# Patient Record
Sex: Female | Born: 1960 | Race: White | Hispanic: No | Marital: Single | Smoking: Never smoker
Health system: Southern US, Community
[De-identification: ages and names within clinical notes are randomized; demographics above are authoritative.]

## PROBLEM LIST (undated history)

## (undated) DIAGNOSIS — R7303 Prediabetes: Secondary | ICD-10-CM

## (undated) DIAGNOSIS — Z8739 Personal history of other diseases of the musculoskeletal system and connective tissue: Secondary | ICD-10-CM

## (undated) DIAGNOSIS — F419 Anxiety disorder, unspecified: Secondary | ICD-10-CM

## (undated) DIAGNOSIS — F329 Major depressive disorder, single episode, unspecified: Secondary | ICD-10-CM

## (undated) DIAGNOSIS — F32A Depression, unspecified: Secondary | ICD-10-CM

## (undated) DIAGNOSIS — Z923 Personal history of irradiation: Secondary | ICD-10-CM

## (undated) DIAGNOSIS — G8929 Other chronic pain: Secondary | ICD-10-CM

## (undated) DIAGNOSIS — G4730 Sleep apnea, unspecified: Secondary | ICD-10-CM

## (undated) DIAGNOSIS — F4310 Post-traumatic stress disorder, unspecified: Secondary | ICD-10-CM

## (undated) DIAGNOSIS — M5135 Other intervertebral disc degeneration, thoracolumbar region: Secondary | ICD-10-CM

## (undated) DIAGNOSIS — M1990 Unspecified osteoarthritis, unspecified site: Secondary | ICD-10-CM

## (undated) HISTORY — PX: NECK SURGERY: SHX720

## (undated) HISTORY — PX: ABDOMINAL HYSTERECTOMY: SHX81

---

## 2015-05-08 ENCOUNTER — Encounter (HOSPITAL_COMMUNITY): Payer: Self-pay

## 2015-05-08 ENCOUNTER — Emergency Department (HOSPITAL_COMMUNITY)
Admission: EM | Admit: 2015-05-08 | Discharge: 2015-05-09 | Disposition: A | Discharge status: Home / Self Care | Payer: Medicare Other | Attending: Emergency Medicine | Admitting: Emergency Medicine

## 2015-05-08 DIAGNOSIS — G8929 Other chronic pain: Secondary | ICD-10-CM | POA: Diagnosis not present

## 2015-05-08 DIAGNOSIS — M25562 Pain in left knee: Secondary | ICD-10-CM | POA: Diagnosis present

## 2015-05-08 DIAGNOSIS — Z8659 Personal history of other mental and behavioral disorders: Secondary | ICD-10-CM | POA: Insufficient documentation

## 2015-05-08 DIAGNOSIS — F329 Major depressive disorder, single episode, unspecified: Secondary | ICD-10-CM | POA: Diagnosis not present

## 2015-05-08 DIAGNOSIS — M25512 Pain in left shoulder: Secondary | ICD-10-CM | POA: Diagnosis not present

## 2015-05-08 HISTORY — DX: Post-traumatic stress disorder, unspecified: F43.10

## 2015-05-08 HISTORY — DX: Depression, unspecified: F32.A

## 2015-05-08 HISTORY — DX: Other chronic pain: G89.29

## 2015-05-08 HISTORY — DX: Anxiety disorder, unspecified: F41.9

## 2015-05-08 HISTORY — DX: Major depressive disorder, single episode, unspecified: F32.9

## 2015-05-08 HISTORY — DX: Personal history of other diseases of the musculoskeletal system and connective tissue: Z87.39

## 2015-05-08 NOTE — ED Notes (Addendum)
Pt presents with c/o generalized body aches. Pt reports she has degenerative disc disease in her neck and lower back so she has been aching all over her body. Pt also c/o left leg pain, hx of sciatic nerve pain.

## 2015-05-08 NOTE — ED Provider Notes (Signed)
CSN: [id number]     Arrival date & time 05/08/15  2229 History  This chart was scribed for non-physician practitioner, Charlann Lange, PA-C, working with April Palumbo, MD, by Stephania Fragmin, ED Scribe. This patient was seen in room WTR8/WTR8 and the patient's care was started at 11:55 PM.    Chief Complaint  Patient presents with  . Generalized Body Aches   The history is provided by the patient and a relative. No language interpreter was used.    HPI Comments: Kelli Estrada is a 54 y.o. female who presents to the Emergency Department complaining of multiple complaints of chronic myalgias that have been worsening over the past week. She reports left knee pain that began 5 months ago, when she was walking down stairs and heard something pop; her relative states her knee started to swell over the past week. She denies ever having a knee work-up or seeing an orthopedist. She was seen in the ED the day her knee popped XRs were taken, which were negative for fracture, and she was given crutches. She states her left shoulder hurts after holding her tablet for a long time. She also notes tingling in her leeft foot that is chronic, as well as chronic back pain in the past 1.5 years due to DDD in her neck and back. Patient recently had an MRI done several months ago in [place]. Patient has taken gabapentin, ibuprofen 800, and Percocet daily with partial relief. Patient endorses "constant" stress incontinence that has been increasing recently, for which she has been evaluated for several years ago. She denies saddle anesthesia.  She states her pain sometimes causes migraines.  She states she is able to ambulate independently, although she walks with a limp.  Patient recently moved up here from [place], [place].    Past Medical History  Diagnosis Date  . H/O degenerative disc disease   . Depression   . Anxiety   . PTSD (post-traumatic stress disorder)   . Chronic pain    Past Surgical History  Procedure  Laterality Date  . Neck surgery    . Abdominal hysterectomy     No family history on file. History  Substance Use Topics  . Smoking status: Never Smoker   . Smokeless tobacco: Not on file  . Alcohol Use: Yes     Comment: occasionally    OB History    No data available     Review of Systems  Constitutional: Negative for fever.  Respiratory: Negative for shortness of breath.   Cardiovascular: Negative for chest pain.  Gastrointestinal: Negative for nausea and vomiting.  Musculoskeletal: Positive for myalgias, back pain and arthralgias.  Skin: Negative for color change and wound.  Neurological: Negative for weakness and headaches.   Allergies  Review of patient's allergies indicates no known allergies.  Home Medications   Prior to Admission medications   Not on File   BP 132/85 mmHg  Pulse 70  Temp(Src) 98.5 F (36.9 C) (Oral)  Resp 20  SpO2 98% Physical Exam  Constitutional: She is oriented to person, place, and time. She appears well-developed and well-nourished. No distress.  HENT:  Head: Normocephalic and atraumatic.  Eyes: Conjunctivae are normal.  Neck: Normal range of motion. Neck supple. No tracheal deviation present.  Cardiovascular: Normal rate, regular rhythm and normal heart sounds.   Pulmonary/Chest: Effort normal and breath sounds normal. No respiratory distress.  Abdominal: Soft. There is no tenderness.  Musculoskeletal: Normal range of motion.  Neck/Back: FROM neck. Pain elicited with  right lateral bending of the neck. Multiple tender points from C-spine down to lumbar; moreso greater in lumbar region. Tenderness elicited moreso in the left lower buttock.   Left Knee: medial joint line tenderness. Decreased strength and extension.  Left Shoulder: FROM. 5/5 strength. Pain elicited with abduction.   Neurological: She is alert and oriented to person, place, and time.  Light sensation intact distally of lower extremity, right greater than left.   Skin:  Skin is warm and dry.  Psychiatric: She has a normal mood and affect. Her behavior is normal.  Nursing note and vitals reviewed.   ED Course  Procedures (including critical care time)  DIAGNOSTIC STUDIES: Oxygen Saturation is 98% on RA, normal by my interpretation.    COORDINATION OF CARE: 12:15 AM - Discussed treatment plan with pt at bedside, and pt agreed to plan.   Labs Review Labs Reviewed - No data to display  Imaging Review No results found.   EKG Interpretation None      MDM   Final diagnoses:  None   1. Chronic pain  No new or concerning findings on exam performed in ED today. She is transitioning into the area from a recent move and will be provided resources for local medical care. VSS. She is felt stable for discharge home.   I personally performed the services described in this documentation, which was scribed in my presence. The recorded information has been reviewed and is accurate.       Charlann Lange, PA-C 05/19/15 0530  April Palumbo, MD 05/21/15 2303

## 2015-05-09 ENCOUNTER — Encounter (HOSPITAL_COMMUNITY): Payer: Self-pay | Admitting: Emergency Medicine

## 2015-05-09 MED ORDER — OXYCODONE-ACETAMINOPHEN 5-325 MG PO TABS: 1.0000 | ORAL_TABLET | ORAL | Status: DC | PRN

## 2015-05-09 NOTE — ED Provider Notes (Signed)
Medical screening examination/treatment/procedure(s) were performed by non-physician practitioner and as supervising physician I was immediately available for consultation/collaboration.   EKG Interpretation None       Lajoy Vanamburg, MD 05/09/15 0700

## 2015-05-09 NOTE — Discharge Instructions (Signed)
FOLLOW UP WITH YOUR DOCTOR AS PLANNED FOR CONTINUED PAIN MANAGEMENT.

## 2015-06-03 DIAGNOSIS — G4733 Obstructive sleep apnea (adult) (pediatric): Secondary | ICD-10-CM | POA: Insufficient documentation

## 2015-06-03 DIAGNOSIS — G8929 Other chronic pain: Secondary | ICD-10-CM | POA: Insufficient documentation

## 2015-06-03 DIAGNOSIS — F331 Major depressive disorder, recurrent, moderate: Secondary | ICD-10-CM | POA: Insufficient documentation

## 2015-06-03 DIAGNOSIS — E559 Vitamin D deficiency, unspecified: Secondary | ICD-10-CM | POA: Insufficient documentation

## 2015-06-03 DIAGNOSIS — F4310 Post-traumatic stress disorder, unspecified: Secondary | ICD-10-CM | POA: Insufficient documentation

## 2015-08-30 ENCOUNTER — Other Ambulatory Visit: Payer: Self-pay | Admitting: Neurological Surgery

## 2015-08-30 DIAGNOSIS — M47812 Spondylosis without myelopathy or radiculopathy, cervical region: Secondary | ICD-10-CM

## 2015-09-18 ENCOUNTER — Ambulatory Visit
Admission: RE | Admit: 2015-09-18 | Discharge: 2015-09-18 | Disposition: A | Discharge status: Home / Self Care | Payer: Medicare Other | Source: Ambulatory Visit | Attending: Neurological Surgery | Admitting: Neurological Surgery

## 2015-09-18 DIAGNOSIS — M47812 Spondylosis without myelopathy or radiculopathy, cervical region: Secondary | ICD-10-CM

## 2015-09-18 MED ORDER — GADOBENATE DIMEGLUMINE 529 MG/ML IV SOLN
20.0000 mL | Freq: Once | INTRAVENOUS | Status: AC | PRN
  Administered 2015-09-18: 20 mL via INTRAVENOUS

## 2015-12-26 ENCOUNTER — Other Ambulatory Visit: Payer: Self-pay | Admitting: Physician Assistant

## 2015-12-26 DIAGNOSIS — Z1231 Encounter for screening mammogram for malignant neoplasm of breast: Secondary | ICD-10-CM

## 2016-01-09 ENCOUNTER — Ambulatory Visit
Admission: RE | Admit: 2016-01-09 | Discharge: 2016-01-09 | Disposition: A | Discharge status: Home / Self Care | Payer: Medicare Other | Source: Ambulatory Visit | Attending: Physician Assistant | Admitting: Physician Assistant

## 2016-01-09 DIAGNOSIS — Z1231 Encounter for screening mammogram for malignant neoplasm of breast: Secondary | ICD-10-CM

## 2016-02-03 ENCOUNTER — Other Ambulatory Visit: Payer: Self-pay | Admitting: Physician Assistant

## 2016-02-03 DIAGNOSIS — R928 Other abnormal and inconclusive findings on diagnostic imaging of breast: Secondary | ICD-10-CM

## 2016-02-06 ENCOUNTER — Other Ambulatory Visit: Payer: Self-pay | Admitting: Physician Assistant

## 2016-02-06 DIAGNOSIS — R928 Other abnormal and inconclusive findings on diagnostic imaging of breast: Secondary | ICD-10-CM

## 2016-02-28 ENCOUNTER — Other Ambulatory Visit: Payer: Self-pay | Admitting: Physician Assistant

## 2016-02-28 ENCOUNTER — Ambulatory Visit
Admission: RE | Admit: 2016-02-28 | Discharge: 2016-02-28 | Disposition: A | Discharge status: Home / Self Care | Payer: Medicare Other | Source: Ambulatory Visit | Attending: Physician Assistant | Admitting: Physician Assistant

## 2016-02-28 DIAGNOSIS — R928 Other abnormal and inconclusive findings on diagnostic imaging of breast: Secondary | ICD-10-CM

## 2016-03-13 ENCOUNTER — Encounter (HOSPITAL_COMMUNITY): Payer: Self-pay | Admitting: *Deleted

## 2016-03-13 NOTE — Progress Notes (Signed)
Left message on voicemail for Kelli Estrada, Assistant to Dr. Amedeo Plenty letting her know that we have left numerous messages on patient;s home phone and cell phone for patient to call us back so we could go over medical history and give her instructions for day of procedure and no return call from patient.

## 2016-03-14 ENCOUNTER — Other Ambulatory Visit: Payer: Self-pay | Admitting: Physician Assistant

## 2016-03-14 DIAGNOSIS — R928 Other abnormal and inconclusive findings on diagnostic imaging of breast: Secondary | ICD-10-CM

## 2016-03-15 ENCOUNTER — Encounter (HOSPITAL_COMMUNITY)
Admission: RE | Disposition: A | Discharge status: Home / Self Care | Payer: Self-pay | Source: Ambulatory Visit | Attending: Gastroenterology

## 2016-03-15 ENCOUNTER — Ambulatory Visit (HOSPITAL_COMMUNITY): Payer: Medicare Other | Admitting: Certified Registered Nurse Anesthetist

## 2016-03-15 ENCOUNTER — Ambulatory Visit (HOSPITAL_COMMUNITY)
Admission: RE | Admit: 2016-03-15 | Discharge: 2016-03-15 | Disposition: A | Discharge status: Home / Self Care | Payer: Medicare Other | Source: Ambulatory Visit | Attending: Gastroenterology | Admitting: Gastroenterology

## 2016-03-15 ENCOUNTER — Encounter (HOSPITAL_COMMUNITY): Payer: Self-pay | Admitting: *Deleted

## 2016-03-15 DIAGNOSIS — Z1211 Encounter for screening for malignant neoplasm of colon: Secondary | ICD-10-CM | POA: Diagnosis not present

## 2016-03-15 DIAGNOSIS — Z79899 Other long term (current) drug therapy: Secondary | ICD-10-CM | POA: Insufficient documentation

## 2016-03-15 DIAGNOSIS — Z6841 Body Mass Index (BMI) 40.0 and over, adult: Secondary | ICD-10-CM | POA: Insufficient documentation

## 2016-03-15 DIAGNOSIS — K5730 Diverticulosis of large intestine without perforation or abscess without bleeding: Secondary | ICD-10-CM | POA: Diagnosis not present

## 2016-03-15 HISTORY — PX: COLONOSCOPY WITH PROPOFOL: SHX5780

## 2016-03-15 SURGERY — COLONOSCOPY WITH PROPOFOL
Anesthesia: Monitor Anesthesia Care

## 2016-03-15 MED ORDER — LIDOCAINE HCL (CARDIAC) 20 MG/ML IV SOLN
INTRAVENOUS | Status: DC | PRN
  Administered 2016-03-15: 50 mg via INTRAVENOUS

## 2016-03-15 MED ORDER — LACTATED RINGERS IV SOLN
INTRAVENOUS | Status: DC
  Administered 2016-03-15: 1000 mL via INTRAVENOUS
  Administered 2016-03-15: 12:00:00 via INTRAVENOUS

## 2016-03-15 MED ORDER — LIDOCAINE HCL (CARDIAC) 20 MG/ML IV SOLN
INTRAVENOUS | Status: AC
  Filled 2016-03-15: qty 5

## 2016-03-15 MED ORDER — PROPOFOL 500 MG/50ML IV EMUL
INTRAVENOUS | Status: DC | PRN
  Administered 2016-03-15: 100 ug/kg/min via INTRAVENOUS

## 2016-03-15 MED ORDER — PROPOFOL 10 MG/ML IV BOLUS
INTRAVENOUS | Status: AC
  Filled 2016-03-15: qty 40

## 2016-03-15 SURGICAL SUPPLY — 21 items

## 2016-03-15 NOTE — Anesthesia Preprocedure Evaluation (Signed)
Anesthesia Evaluation  Patient identified by MRN, date of birth, ID band Patient awake    Reviewed: Allergy & Precautions, NPO status , Patient's Chart, lab work & pertinent test results  Airway Mallampati: II  TM Distance: >3 FB Neck ROM: Full    Dental no notable dental hx. (+) Partial Lower, Partial Upper   Pulmonary neg pulmonary ROS,    Pulmonary exam normal breath sounds clear to auscultation       Cardiovascular negative cardio ROS Normal cardiovascular exam Rhythm:Regular Rate:Normal     Neuro/Psych negative neurological ROS  negative psych ROS   GI/Hepatic negative GI ROS, Neg liver ROS,   Endo/Other  Morbid obesity  Renal/GU negative Renal ROS  negative genitourinary   Musculoskeletal negative musculoskeletal ROS (+)   Abdominal   Peds negative pediatric ROS (+)  Hematology negative hematology ROS (+)   Anesthesia Other Findings   Reproductive/Obstetrics negative OB ROS                             Anesthesia Physical Anesthesia Plan  ASA: III  Anesthesia Plan: MAC   Post-op Pain Management:    Induction:   Airway Management Planned: Simple Face Mask  Additional Equipment:   Intra-op Plan:   Post-operative Plan:   Informed Consent: I have reviewed the patients History and Physical, chart, labs and discussed the procedure including the risks, benefits and alternatives for the proposed anesthesia with the patient or authorized representative who has indicated his/her understanding and acceptance.   Dental advisory given  Plan Discussed with: CRNA  Anesthesia Plan Comments:         Anesthesia Quick Evaluation

## 2016-03-15 NOTE — Interval H&P Note (Signed)
History and Physical Interval Note:  03/15/2016 12:18 PM  Kelli Estrada  has presented today for surgery, with the diagnosis of screening  The various methods of treatment have been discussed with the patient and family. After consideration of risks, benefits and other options for treatment, the patient has consented to  Procedure(s): COLONOSCOPY WITH PROPOFOL (N/A) as a surgical intervention .  The patient's history has been reviewed, patient examined, no change in status, stable for surgery.  I have reviewed the patient's chart and labs.  Questions were answered to the patient's satisfaction.     Letoya Stallone C

## 2016-03-15 NOTE — Op Note (Signed)
[facility] Patient Name: Kelli Estrada Procedure Date: 03/15/2016 MRN: [id number] Attending MD: Missy Sabins , MD Date of Birth: [birth year] CSN: [id number] Age: 55 Admit Type: Outpatient Procedure:                Colonoscopy Indications:              Screening for colorectal malignant neoplasm Providers:                Elyse Jarvis. Amedeo Plenty, MD, Jobe Igo, RN, Alfonso Patten,                            Technician, Chandra Batch, CRNA Referring MD:              Medicines:                Propofol per Anesthesia Complications:            No immediate complications. Estimated Blood Loss:     Estimated blood loss: none. Procedure:                Pre-Anesthesia Assessment:                           - Prior to the procedure, a History and Physical                            was performed, and patient medications and                            allergies were reviewed. The patient's tolerance of                            previous anesthesia was also reviewed. The risks                            and benefits of the procedure and the sedation                            options and risks were discussed with the patient.                            All questions were answered, and informed consent                            was obtained. Prior Anticoagulants: The patient has                            taken no previous anticoagulant or antiplatelet                            agents. ASA Grade Assessment: III - A patient with                            severe systemic disease. After reviewing the risks  and benefits, the patient was deemed in                            satisfactory condition to undergo the procedure.                           After obtaining informed consent, the colonoscope                            was passed under direct vision. Throughout the                            procedure, the patient's blood pressure, pulse, and         oxygen saturations were monitored continuously. The                            [id number]) scope was introduced through                            the anus and advanced to the the cecum, identified                            by appendiceal orifice and ileocecal valve. The                            colonoscopy was performed without difficulty. The                            patient tolerated the procedure well. The quality                            of the bowel preparation was good. The ileocecal                            valve, appendiceal orifice, and rectum were                            photographed. The ileocecal valve, appendiceal                            orifice, and rectum were photographed. Findings:      Many diverticula were found in the sigmoid colon.      The exam was otherwise without abnormality on direct and retroflexion       views. Impression:               - Diverticulosis in the sigmoid colon.                           - The examination was otherwise normal on direct                            and retroflexion views.                           -  No specimens collected. Moderate Sedation:      Moderate (conscious) sedation was. [Parameters Monitored]. [Procedure       Duration Time]. Recommendation:           - Patient has a contact number available for                            emergencies. The signs and symptoms of potential                            delayed complications were discussed with the                            patient. Return to normal activities tomorrow.                            Written discharge instructions were provided to the                            patient.                           - Resume previous diet.                           - Continue present medications.                           - Repeat colonoscopy in 10 years for screening                            purposes. Procedure Code(s):        --- Professional ---                            [phone], Colonoscopy, flexible; diagnostic, including                            collection of specimen(s) by brushing or washing,                            when performed (separate procedure) Diagnosis Code(s):        --- Professional ---                           Z12.11, Encounter for screening for malignant                            neoplasm of colon                           K57.30, Diverticulosis of large intestine without                            perforation or abscess without bleeding CPT copyright 2016 American Medical Association. All rights reserved. The codes documented in this report are preliminary and upon coder review may  be revised  to meet current compliance requirements. Missy Sabins, MD 03/15/2016 12:51:06 PM This report has been signed electronically. Number of Addenda: 0

## 2016-03-15 NOTE — Discharge Instructions (Signed)
Colonoscopy, Care After These instructions give you information on caring for yourself after your procedure. Your doctor may also give you more specific instructions. Call your doctor if you have any problems or questions after your procedure. HOME CARE  Do not drive for 24 hours.  Do not sign important papers or use machinery for 24 hours.  You may shower.  You may go back to your usual activities, but go slower for the first 24 hours.  Take rest breaks often during the first 24 hours.  Walk around or use warm packs on your belly (abdomen) if you have belly cramping or gas.  Drink enough fluids to keep your pee (urine) clear or pale yellow.  Resume your normal diet. Avoid heavy or fried foods.  Avoid drinking alcohol for 24 hours or as told by your doctor.  Only take medicines as told by your doctor. If a tissue sample (biopsy) was taken during the procedure:   Do not take aspirin or blood thinners for 7 days, or as told by your doctor.  Do not drink alcohol for 7 days, or as told by your doctor.  Eat soft foods for the first 24 hours. GET HELP IF: You still have a small amount of blood in your poop (stool) 2-3 days after the procedure. GET HELP RIGHT AWAY IF:  You have more than a small amount of blood in your poop.  You see clumps of tissue (blood clots) in your poop.  Your belly is puffy (swollen).  You feel sick to your stomach (nauseous) or throw up (vomit).  You have a fever.  You have belly pain that gets worse and medicine does not help. MAKE SURE YOU:  Understand these instructions.  Will watch your condition.  Will get help right away if you are not doing well or get worse.   This information is not intended to replace advice given to you by your health care provider. Make sure you discuss any questions you have with your health care provider.   Document Released: 11/03/2010 Document Revised: 10/06/2013 Document Reviewed: 06/08/2013 Elsevier  Interactive Patient Education [employer].    Diverticulosis Diverticulosis is the condition that develops when small pouches (diverticula) form in the wall of your colon. Your colon, or large intestine, is where water is absorbed and stool is formed. The pouches form when the inside layer of your colon pushes through weak spots in the outer layers of your colon. CAUSES  No one knows exactly what causes diverticulosis. RISK FACTORS  Being older than 23. Your risk for this condition increases with age. Diverticulosis is rare in people younger than 40 years. By age 18, almost everyone has it.  Eating a low-fiber diet.  Being frequently constipated.  Being overweight.  Not getting enough exercise.  Smoking.  Taking over-the-counter pain medicines, like aspirin and ibuprofen. SYMPTOMS  Most people with diverticulosis do not have symptoms. DIAGNOSIS  Because diverticulosis often has no symptoms, health care providers often discover the condition during an exam for other colon problems. In many cases, a health care provider will diagnose diverticulosis while using a flexible scope to examine the colon (colonoscopy). TREATMENT  If you have never developed an infection related to diverticulosis, you may not need treatment. If you have had an infection before, treatment may include:  Eating more fruits, vegetables, and grains.  Taking a fiber supplement.  Taking a live bacteria supplement (probiotic).  Taking medicine to relax your colon. HOME CARE INSTRUCTIONS   Drink  at least 6-8 glasses of water each day to prevent constipation.  Try not to strain when you have a bowel movement.  Keep all follow-up appointments. If you have had an infection before:  Increase the fiber in your diet as directed by your health care provider or dietitian.  Take a dietary fiber supplement if your health care provider approves.  Only take medicines as directed by your health care  provider. SEEK MEDICAL CARE IF:   You have abdominal pain.  You have bloating.  You have cramps.  You have not gone to the bathroom in 3 days. SEEK IMMEDIATE MEDICAL CARE IF:   Your pain gets worse.  Yourbloating becomes very bad.  You have a fever or chills, and your symptoms suddenly get worse.  You begin vomiting.  You have bowel movements that are bloody or black. MAKE SURE YOU:  Understand these instructions.  Will watch your condition.  Will get help right away if you are not doing well or get worse.   This information is not intended to replace advice given to you by your health care provider. Make sure you discuss any questions you have with your health care provider.   Document Released: 06/28/2004 Document Revised: 10/06/2013 Document Reviewed: 08/26/2013 Elsevier Interactive Patient Education [employer].

## 2016-03-15 NOTE — H&P (View-Only) (Signed)
Left message on voicemail for Loma Sousa, Assistant to Dr. Amedeo Plenty letting her know that we have left numerous messages on patient;s home phone and cell phone for patient to call us back so we could go over medical history and give her instructions for day of procedure and no return call from patient.

## 2016-03-16 ENCOUNTER — Encounter (HOSPITAL_COMMUNITY): Payer: Self-pay | Admitting: Gastroenterology

## 2016-03-19 ENCOUNTER — Ambulatory Visit
Admission: RE | Admit: 2016-03-19 | Discharge: 2016-03-19 | Disposition: A | Discharge status: Home / Self Care | Payer: Medicare Other | Source: Ambulatory Visit | Attending: Physician Assistant | Admitting: Physician Assistant

## 2016-03-19 DIAGNOSIS — R928 Other abnormal and inconclusive findings on diagnostic imaging of breast: Secondary | ICD-10-CM

## 2016-03-20 NOTE — Anesthesia Postprocedure Evaluation (Signed)
Anesthesia Post Note  Patient: Kandrea Brinkerhoff  Procedure(s) Performed: Procedure(s) (LRB): COLONOSCOPY WITH PROPOFOL (N/A)  Patient location during evaluation: Endoscopy Anesthesia Type: MAC Level of consciousness: awake and alert Pain management: pain level controlled Vital Signs Assessment: post-procedure vital signs reviewed and stable Respiratory status: spontaneous breathing, nonlabored ventilation and respiratory function stable Cardiovascular status: blood pressure returned to baseline and stable Postop Assessment: no signs of nausea or vomiting Anesthetic complications: no     Last Vitals:  Filed Vitals:   03/15/16 1310 03/15/16 1320  BP: 126/76 126/76  Pulse: 67 80  Temp:    Resp: 16 19    Last Pain:  Filed Vitals:   03/15/16 1322  PainSc: 9    Pain Goal: Patients Stated Pain Goal: 4 (03/15/16 1114)               Montez Hageman

## 2016-04-25 ENCOUNTER — Encounter (HOSPITAL_COMMUNITY): Payer: Self-pay | Admitting: Gastroenterology

## 2016-04-25 NOTE — Transfer of Care (Signed)
Immediate Anesthesia Transfer of Care Note  Patient: Kelli Estrada  Procedure(s) Performed: Procedure(s): COLONOSCOPY WITH PROPOFOL (N/A)  Patient Location: PACU and Endoscopy Unit        Last Vitals:  Filed Vitals:   03/15/16 1310 03/15/16 1320  BP: 126/76 126/76  Pulse: 67 80  Temp:    Resp: 16 19    Last Pain:  Filed Vitals:   03/15/16 1322  PainSc: 9       Patients Stated Pain Goal: 4 ([id number])  Complications: No apparent anesthesia complications

## 2016-08-10 IMAGING — MR MR CERVICAL SPINE WO/W CM
5 of 8 series · 27 of 48 positions shown · IV contrast (Multihance 20 ML)
Comparison: Outside MRI cervical spine 08/15/2013

CLINICAL DATA: Cervical spondylosis. Chronic neck and left arm
pain. Neck surgery 4004.

EXAM:
MRI CERVICAL SPINE WITHOUT AND WITH CONTRAST
TECHNIQUE: Multiplanar and multiecho pulse sequences of the cervical spine, to
include the craniocervical junction and cervicothoracic junction,
were obtained according to standard protocol without and with
intravenous contrast.
CONTRAST:  20mL MULTIHANCE GADOBENATE DIMEGLUMINE 529 MG/ML IV SOLN

[Series 5: T1 · sagittal · 3.0mm · 0.66mm/px · 4 of 15 slices shown (1 of 3)]
[im 1/15]
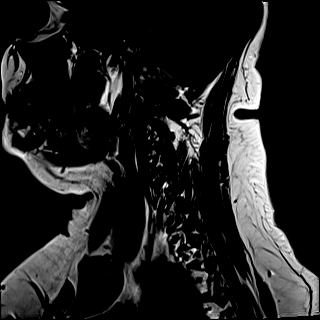
[im 5/15]
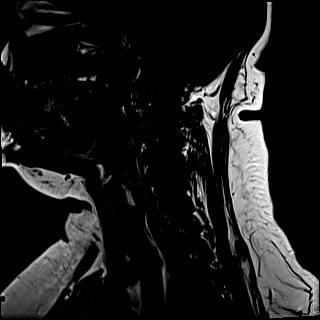
[im 10/15]
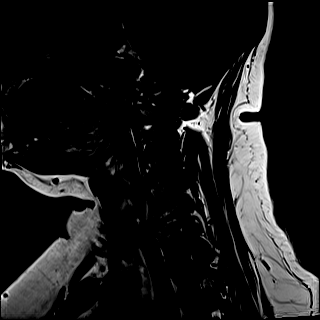
[im 15/15]
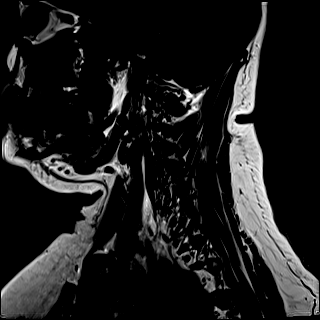

[Series 9: T1 · axial · non-contrast · 3.0mm · 0.35mm/px · z∈[-77,+10]mm · 8 of 28 slices shown (2 of 3)]
[im 1/28]
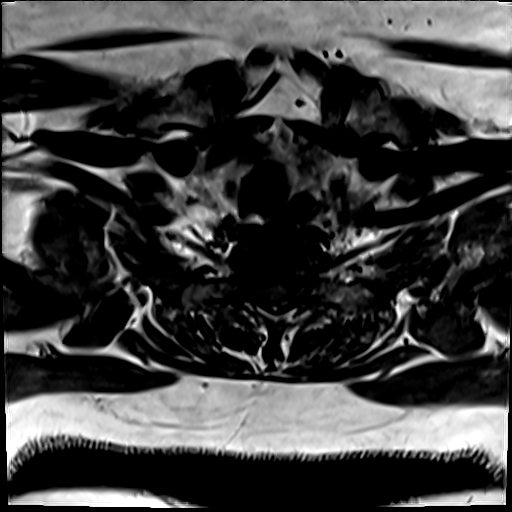
[im 4/28]
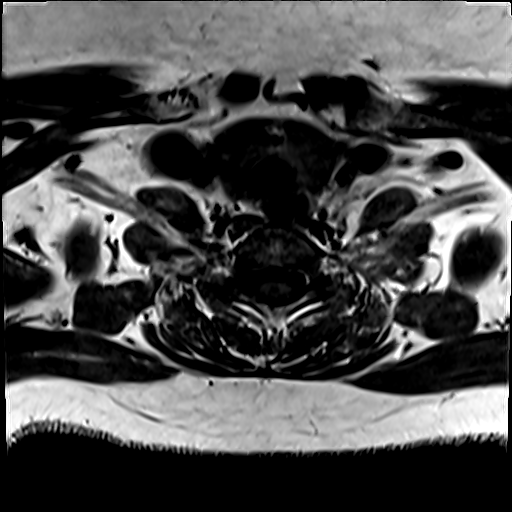
[im 8/28]
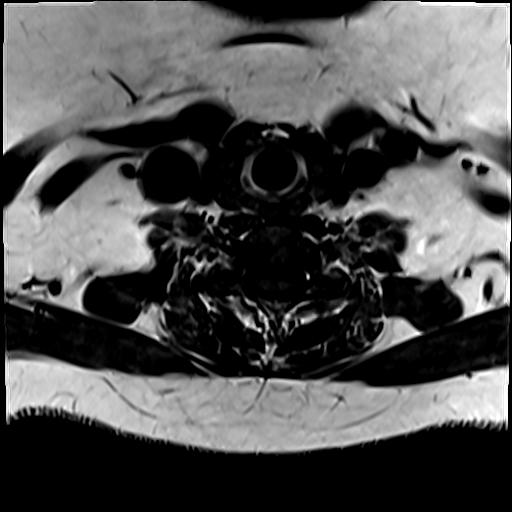
[im 12/28]
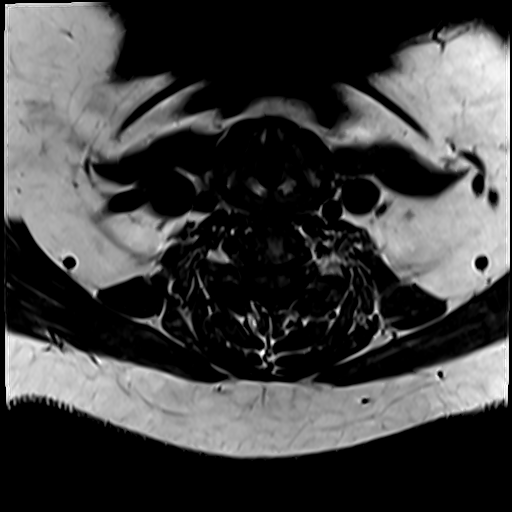
[im 16/28]
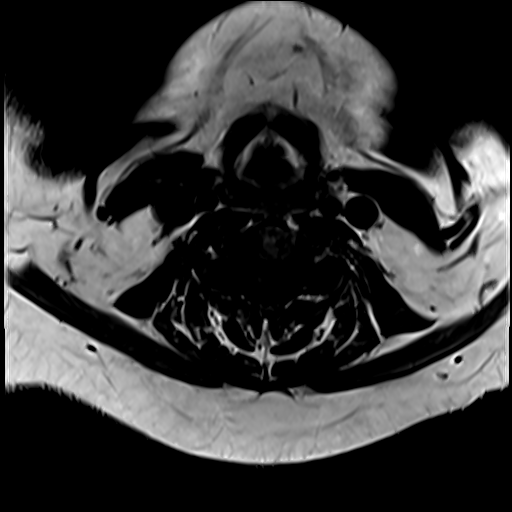
[im 20/28]
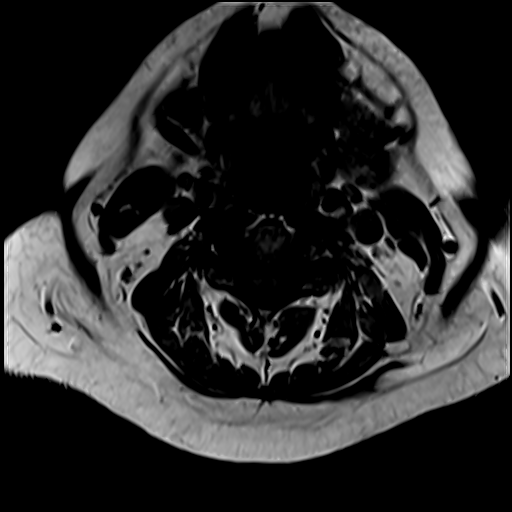
[im 24/28]
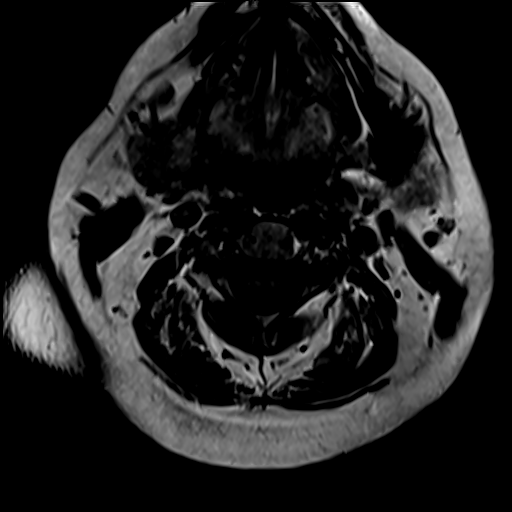
[im 28/28]
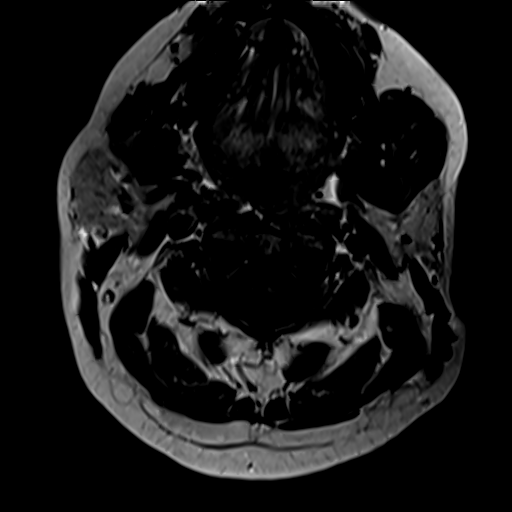

[Series 10: T2 · axial · 3.0mm · 0.56mm/px · z∈[-84,+9]mm · 8 of 30 slices shown (1 of 2)]
[im 1/30]
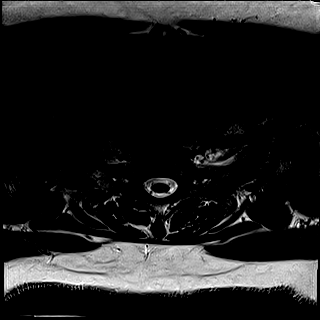
[im 5/30]
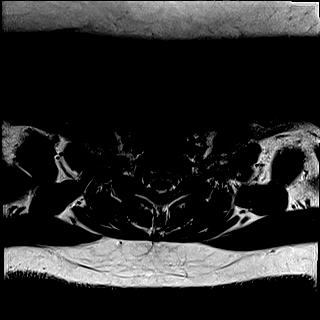
[im 9/30]
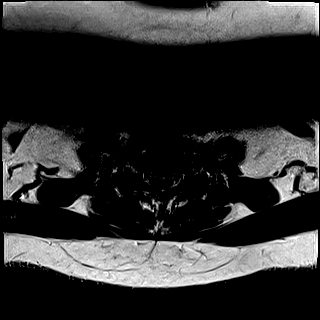
[im 13/30]
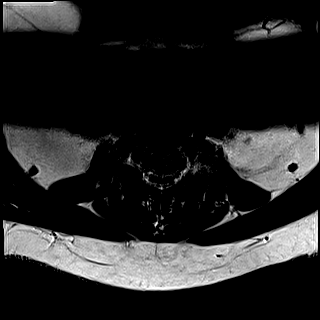
[im 17/30]
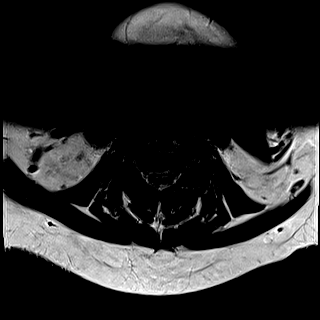
[im 21/30]
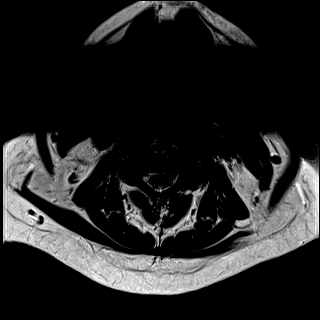
[im 25/30]
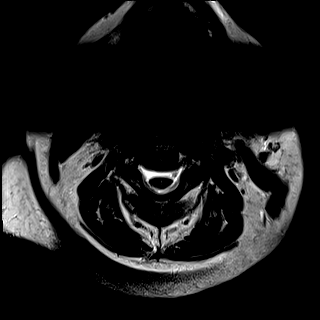
[im 30/30]
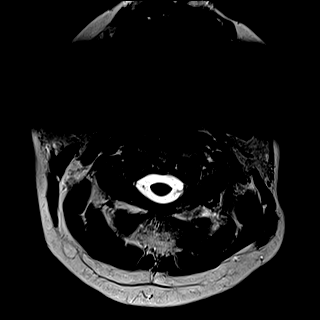

[Series 11: T2 · sagittal · 3.0mm · 0.55mm/px · 4 of 15 slices shown (2 of 2)]
[im 1/15]
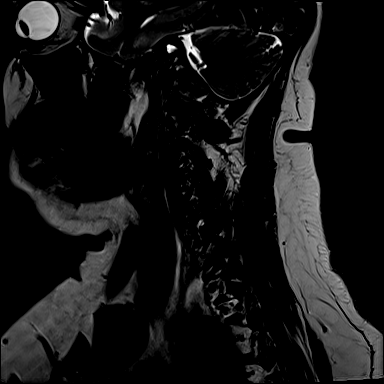
[im 5/15]
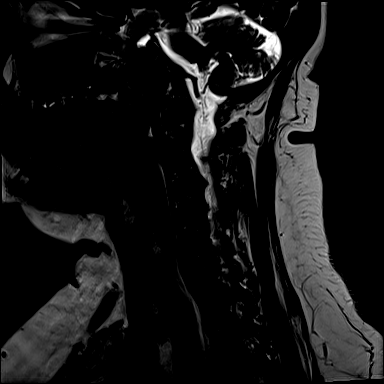
[im 10/15]
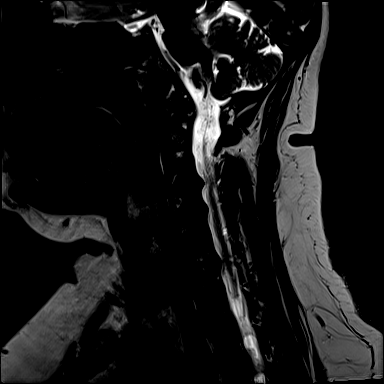
[im 15/15]
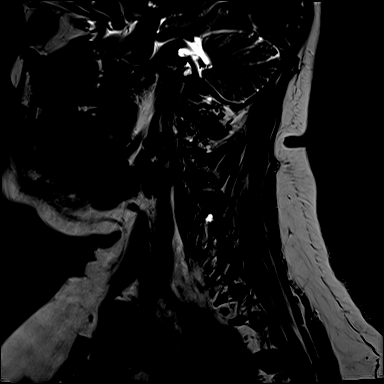

[Series 13: T1 · axial · 3.0mm · 0.35mm/px · z∈[-77,-54]mm · 3 of 28 slices shown (3 of 3)]
[im 1/28]
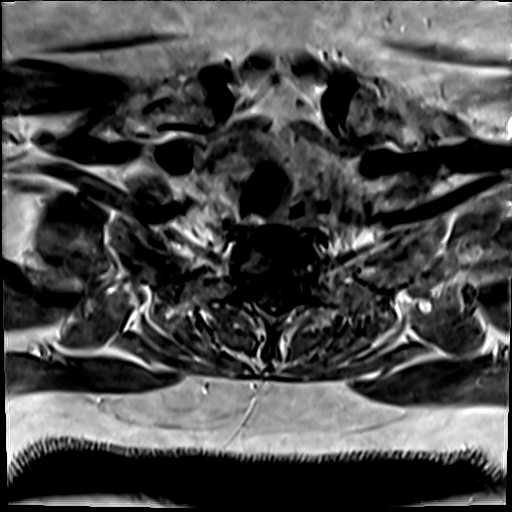
[im 4/28]
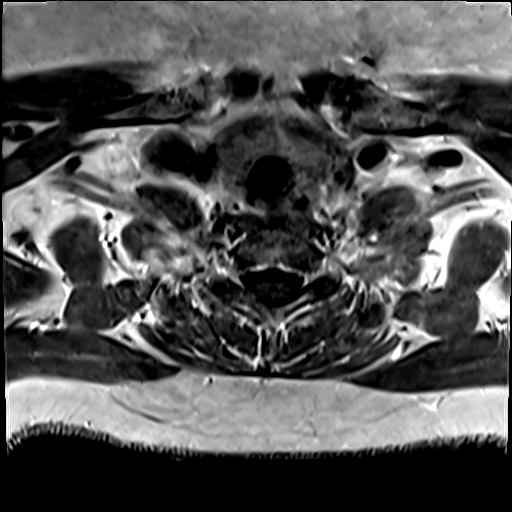
[im 8/28]
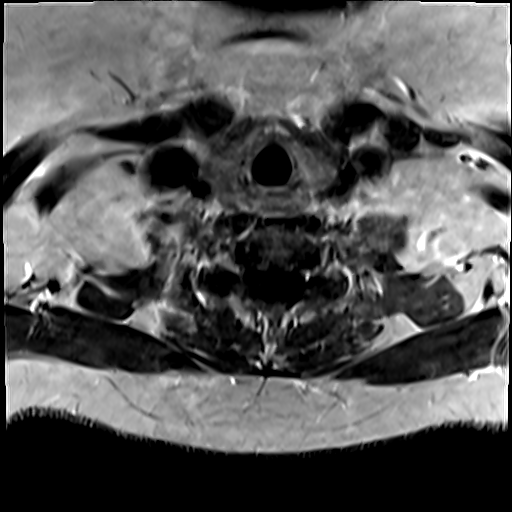

[27 of 48 positions shown; findings below may reference images not displayed]

FINDINGS: Straightening of the cervical lordosis. Negative for fracture or
mass. No bone marrow edema. Spinal cord signal is normal. Cervical
medullary junction is normal.

C2-3:  Negative

C3-4: Disc degeneration and spondylosis. Disc space narrowing and
diffuse uncinate spurring right greater than left. Mild spinal
stenosis. There is foraminal encroachment bilaterally right greater
than left. Spurring on the right has progressed since the prior
study

C4-5: Disc degeneration and mild spondylosis. Diffuse uncinate
spurring causing mild spinal stenosis and mild foraminal stenosis
bilaterally

C5-6:  Solid interbody fusion.  No spinal or foraminal stenosis

C6-7: Disc degeneration and spondylosis. Diffuse uncinate spurring
left greater than right. Moderate left foraminal encroachment
similar to the prior study. Mild right foraminal narrowing. Mild
spinal stenosis

C7-T1: Negative

Small central disc protrusion T1-2
IMPRESSION: Right-sided uncinate spurring has progressed at C3-4 causing
moderate right foraminal encroachment and mild spinal stenosis

Mild spinal and foraminal stenosis bilaterally at C4-5

Solid fusion C5-6

Disc degeneration and spondylosis left greater than right at C6-7
with left foraminal encroachment.

## 2017-11-20 DIAGNOSIS — J180 Bronchopneumonia, unspecified organism: Secondary | ICD-10-CM | POA: Diagnosis not present

## 2017-11-21 DIAGNOSIS — J180 Bronchopneumonia, unspecified organism: Secondary | ICD-10-CM | POA: Diagnosis not present

## 2018-04-28 ENCOUNTER — Emergency Department (HOSPITAL_COMMUNITY)
Admission: EM | Admit: 2018-04-28 | Discharge: 2018-04-29 | Disposition: A | Discharge status: Home / Self Care | Payer: Medicare Other | Attending: Emergency Medicine | Admitting: Emergency Medicine

## 2018-04-28 ENCOUNTER — Encounter (HOSPITAL_COMMUNITY): Payer: Self-pay | Admitting: Emergency Medicine

## 2018-04-28 DIAGNOSIS — N201 Calculus of ureter: Secondary | ICD-10-CM | POA: Diagnosis not present

## 2018-04-28 DIAGNOSIS — Z79899 Other long term (current) drug therapy: Secondary | ICD-10-CM | POA: Insufficient documentation

## 2018-04-28 DIAGNOSIS — R1031 Right lower quadrant pain: Secondary | ICD-10-CM | POA: Diagnosis present

## 2018-04-28 LAB — URINALYSIS, ROUTINE W REFLEX MICROSCOPIC
BILIRUBIN URINE: NEGATIVE
Bacteria, UA: NONE SEEN
GLUCOSE, UA: NEGATIVE mg/dL
KETONES UR: 80 mg/dL — AB
LEUKOCYTES UA: NEGATIVE
NITRITE: NEGATIVE
PH: 7 (ref 5.0–8.0)
Protein, ur: NEGATIVE mg/dL
Specific Gravity, Urine: 1.023 (ref 1.005–1.030)

## 2018-04-28 LAB — COMPREHENSIVE METABOLIC PANEL
ALK PHOS: 64 U/L (ref 38–126)
ALT: 25 U/L (ref 0–44)
ANION GAP: 14 (ref 5–15)
AST: 20 U/L (ref 15–41)
Albumin: 3.9 g/dL (ref 3.5–5.0)
BUN: 16 mg/dL (ref 6–20)
CALCIUM: 9.5 mg/dL (ref 8.9–10.3)
CO2: 22 mmol/L (ref 22–32)
CREATININE: 1.04 mg/dL — AB (ref 0.44–1.00)
Chloride: 104 mmol/L (ref 98–111)
GFR, EST NON AFRICAN AMERICAN: 59 mL/min — AB (ref >60)
Glucose, Bld: 150 mg/dL — ABNORMAL HIGH (ref 70–99)
Potassium: 3.9 mmol/L (ref 3.5–5.1)
Sodium: 140 mmol/L (ref 135–145)
TOTAL PROTEIN: 6.9 g/dL (ref 6.5–8.1)
Total Bilirubin: 1.1 mg/dL (ref 0.3–1.2)

## 2018-04-28 LAB — CBC
HCT: 44.2 % (ref 36.0–46.0)
Hemoglobin: 14 g/dL (ref 12.0–15.0)
MCH: 27.5 pg (ref 26.0–34.0)
MCHC: 31.7 g/dL (ref 30.0–36.0)
MCV: 86.7 fL (ref 78.0–100.0)
PLATELETS: 162 10*3/uL (ref 150–400)
RBC: 5.1 MIL/uL (ref 3.87–5.11)
RDW: 14.2 % (ref 11.5–15.5)
WBC: 9.5 10*3/uL (ref 4.0–10.5)

## 2018-04-28 LAB — I-STAT BETA HCG BLOOD, ED (MC, WL, AP ONLY): I-stat hCG, quantitative: <5 m[IU]/mL (ref <5)

## 2018-04-28 LAB — LIPASE, BLOOD: LIPASE: 34 U/L (ref 11–51)

## 2018-04-28 MED ORDER — ONDANSETRON 4 MG PO TBDP
4.0000 mg | ORAL_TABLET | Freq: Once | ORAL | Status: AC | PRN
  Administered 2018-04-28: 4 mg via ORAL
  Filled 2018-04-28: qty 1

## 2018-04-28 NOTE — ED Triage Notes (Signed)
Pt reports lower right abdominal pain that started yesterday, got better after a bowel movement and then came back today.  Pt is unable to have a bowel movement despite several home remedies.

## 2018-04-29 ENCOUNTER — Emergency Department (HOSPITAL_COMMUNITY): Payer: Medicare Other

## 2018-04-29 DIAGNOSIS — N201 Calculus of ureter: Secondary | ICD-10-CM | POA: Diagnosis not present

## 2018-04-29 MED ORDER — TAMSULOSIN HCL 0.4 MG PO CAPS: 0.4000 mg | ORAL_CAPSULE | Freq: Every day | ORAL | 0 refills | Status: DC

## 2018-04-29 MED ORDER — KETOROLAC TROMETHAMINE 30 MG/ML IJ SOLN
15.0000 mg | Freq: Once | INTRAMUSCULAR | Status: AC
  Administered 2018-04-29: 15 mg via INTRAVENOUS
  Filled 2018-04-29: qty 1

## 2018-04-29 MED ORDER — FENTANYL CITRATE (PF) 100 MCG/2ML IJ SOLN
50.0000 ug | Freq: Once | INTRAMUSCULAR | Status: AC
  Administered 2018-04-29: 50 ug via INTRAVENOUS
  Filled 2018-04-29: qty 2

## 2018-04-29 MED ORDER — ONDANSETRON 4 MG PO TBDP: 4.0000 mg | ORAL_TABLET | Freq: Three times a day (TID) | ORAL | 0 refills | Status: DC | PRN

## 2018-04-29 MED ORDER — OXYCODONE-ACETAMINOPHEN 5-325 MG PO TABS
1.0000 | ORAL_TABLET | Freq: Once | ORAL | Status: AC
  Administered 2018-04-29: 1 via ORAL
  Filled 2018-04-29: qty 1

## 2018-04-29 MED ORDER — IOHEXOL 300 MG/ML  SOLN
100.0000 mL | Freq: Once | INTRAMUSCULAR | Status: AC | PRN
  Administered 2018-04-29: 100 mL via INTRAVENOUS

## 2018-04-29 NOTE — ED Notes (Signed)
Discharge Instructions reviewed with patient. Signature pad unavailable. Pt verbalized understanding of instructions and prescriptions.

## 2018-04-29 NOTE — ED Provider Notes (Signed)
[facility] Provider Note   CSN: [id number] Arrival date & time: 04/28/18  1808     History   Chief Complaint Chief Complaint  Patient presents with  . Abdominal Pain  . Emesis    HPI Kelli Estrada is a 57 y.o. female.  HPI 57 year old Caucasian female past medical history significant for chronic pain, depression, anxiety presents to the emergency department today for evaluation of right lower quadrant abdominal pain.  Patient states the pain is been ongoing since yesterday.  She reports that the pain is sharp in nature does not radiate.  Reports associated emesis and nausea.  She did try several medications including milk of magnesia and her pain medication with only little relief.  Patient denies any urinary symptoms.  Reports not having a bowel movement today.  Reports normal bowel movement yesterday.  Denies melena or hematochezia.  Rates the pain a 8/10.  Denies any history of same pain.  Pt denies any fever, chill, ha, vision changes, lightheadedness, dizziness, congestion, neck pain, cp, sob, cough,urinary symptoms, melena, hematochezia, lower extremity paresthesias.  Past Medical History:  Diagnosis Date  . Anxiety   . Chronic pain   . Depression   . H/O degenerative disc disease   . PTSD (post-traumatic stress disorder)     There are no active problems to display for this patient.   Past Surgical History:  Procedure Laterality Date  . ABDOMINAL HYSTERECTOMY    . COLONOSCOPY WITH PROPOFOL N/A 03/15/2016   Procedure: COLONOSCOPY WITH PROPOFOL;  Surgeon: Teena Irani, MD;  Location: [facility];  Service: Endoscopy;  Laterality: N/A;  . NECK SURGERY       OB History   None      Home Medications    Prior to Admission medications   Medication Sig Start Date End Date Taking? Authorizing Provider  doxepin (SINEQUAN) 25 MG capsule Take 25 mg by mouth at bedtime. 04/16/18  Yes [provider]  gabapentin (NEURONTIN) 300  MG capsule Take 300 mg by mouth 3 (three) times daily. 02/24/16  Yes [provider]  hydrOXYzine (VISTARIL) 25 MG capsule Take 25 mg by mouth 2 (two) times daily as needed for anxiety.  04/16/18  Yes [provider]  ibuprofen (ADVIL,MOTRIN) 800 MG tablet Take 800 mg by mouth 2 (two) times daily. 02/22/16  Yes [provider]  Oxcarbazepine (TRILEPTAL) 300 MG tablet Take 300 mg by mouth 2 (two) times daily. 04/03/15  Yes [provider]  oxyCODONE-acetaminophen (PERCOCET/ROXICET) 5-325 MG tablet Take 1 tablet by mouth every 6 (six) hours as needed (For pain.).  06/03/15  Yes [provider]  pravastatin (PRAVACHOL) 40 MG tablet Take 40 mg by mouth daily. 04/23/18  Yes [provider]  sertraline (ZOLOFT) 100 MG tablet Take 100 mg by mouth 2 (two) times daily. 02/21/16  Yes [provider]    Family History No family history on file.  Social History Social History   Tobacco Use  . Smoking status: Never Smoker  Substance Use Topics  . Alcohol use: Yes    Comment: occasionally   . Drug use: No     Allergies   Patient has no known allergies.   Review of Systems Review of Systems  All other systems reviewed and are negative.    Physical Exam Updated Vital Signs BP 112/69   Pulse 61   Temp 98.7 F (37.1 C) (Oral)   Resp 14   Ht 5\' 3"  (1.6 m)  Wt 125.6 kg (277 lb)   SpO2 99%   BMI 49.07 kg/m   Physical Exam  Constitutional: She is oriented to person, place, and time. She appears well-developed and well-nourished.  Non-toxic appearance. No distress.  HENT:  Head: Normocephalic and atraumatic.  Nose: Nose normal.  Mouth/Throat: Oropharynx is clear and moist.  Eyes: Pupils are equal, round, and reactive to light. Conjunctivae are normal. Right eye exhibits no discharge. Left eye exhibits no discharge.  Neck: Normal range of motion. Neck supple.  Cardiovascular: Normal rate, regular rhythm, normal heart sounds and  intact distal pulses.  Pulmonary/Chest: Effort normal and breath sounds normal. No respiratory distress. She exhibits no tenderness.  Abdominal: Soft. Normal appearance and bowel sounds are normal. There is tenderness in the right lower quadrant and suprapubic area. There is no rigidity, no rebound, no guarding, no CVA tenderness, no tenderness at McBurney's point and negative Murphy's sign.  Obese abdomen.  Musculoskeletal: Normal range of motion. She exhibits no tenderness.  Lymphadenopathy:    She has no cervical adenopathy.  Neurological: She is alert and oriented to person, place, and time.  Skin: Skin is warm and dry. Capillary refill takes less than 2 seconds.  Psychiatric: Her behavior is normal. Judgment and thought content normal.  Nursing note and vitals reviewed.    ED Treatments / Results  Labs (all labs ordered are listed, but only abnormal results are displayed) Labs Reviewed  COMPREHENSIVE METABOLIC PANEL - Abnormal; Notable for the following components:      Result Value   Glucose, Bld 150 (*)    Creatinine, Ser 1.04 (*)    GFR calc non Af Amer 59 (*)    All other components within normal limits  URINALYSIS, ROUTINE W REFLEX MICROSCOPIC - Abnormal; Notable for the following components:   Hgb urine dipstick MODERATE (*)    Ketones, ur 80 (*)    All other components within normal limits  LIPASE, BLOOD  CBC  I-STAT BETA HCG BLOOD, ED ([facility], [facility], [facility] ONLY)    EKG None  Radiology Ct Abdomen Pelvis W Contrast  Result Date: 04/29/2018 CLINICAL DATA:  57 year old female with abdominal pain, nausea vomiting. EXAM: CT ABDOMEN AND PELVIS WITH CONTRAST TECHNIQUE: Multidetector CT imaging of the abdomen and pelvis was performed using the standard protocol following bolus administration of intravenous contrast. CONTRAST:  176mL OMNIPAQUE IOHEXOL 300 MG/ML  SOLN COMPARISON:  None. FINDINGS: Lower chest: The visualized lung bases are clear. No intra-abdominal free air or free  fluid. Hepatobiliary: Apparent diffuse fatty infiltration of the liver. No intrahepatic biliary ductal dilatation. The gallbladder is unremarkable. Pancreas: Unremarkable. No pancreatic ductal dilatation or surrounding inflammatory changes. Spleen: Normal in size without focal abnormality. Adrenals/Urinary Tract: The adrenal glands are unremarkable. There is a 5 mm distal ureteral stone with mild right hydronephrosis. There is a 3 mm nonobstructing left renal interpolar calculus. 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 Additional subcentimeter left renal hypodense lesions are too small to characterize. There is no hydronephrosis on the left. The left ureter and urinary bladder appear unremarkable. Stomach/Bowel: There is no bowel obstruction or active inflammation. The appendix is unremarkable. Vascular/Lymphatic: The abdominal aorta and IVC are unremarkable. No portal venous gas. There is no adenopathy. Reproductive: Hysterectomy.  No pelvic mass. Other: None Musculoskeletal: No acute or significant osseous findings. IMPRESSION: 1. A 5 mm distal right ureteral calculus with mild right hydronephrosis. A small nonobstructing left renal interpolar  calculus. No hydronephrosis on the left. 2. Probable mild fatty liver. 3. No bowel obstruction or active inflammation.  Normal appendix. 4. 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 Electronically Signed   By: Anner Crete M.D.   On: 04/29/2018 04:34    Procedures Procedures (including critical care time)  Medications Ordered in ED Medications  ketorolac (TORADOL) 30 MG/ML injection 15 mg (has no administration in time range)  oxyCODONE-acetaminophen (PERCOCET/ROXICET) 5-325 MG per tablet 1 tablet (has no administration in time range)  ondansetron (ZOFRAN-ODT) disintegrating tablet 4 mg (4 mg Oral Given 04/28/18 1916)  fentaNYL (SUBLIMAZE) injection 50 mcg (50  mcg Intravenous Given 04/29/18 0330)  iohexol (OMNIPAQUE) 300 MG/ML solution 100 mL (100 mLs Intravenous Contrast Given 04/29/18 0359)     Initial Impression / Assessment and Plan / ED Course  I have reviewed the triage vital signs and the nursing notes.  Pertinent labs & imaging results that were available during my care of the patient were reviewed by me and considered in my medical decision making (see chart for details).     Presents with right lower quadrant abdominal pain.  Reports associated emesis.  Denies fevers or urinary symptoms.  Pt has been diagnosed with a Kidney Stone via CT. There is no evidence of significant hydronephrosis, serum creatine WNL, vitals sign stable and the pt does not have irratractable vomiting.  UA shows no signs of infection.  No leukocytosis.  Lipase is normal.  Did inform patient of CT findings including fatty infiltrate of liver and likely left renal cyst.    Pt is hemodynamically stable, in NAD, & able to ambulate in the ED. Evaluation does not show pathology that would require ongoing emergent intervention or inpatient treatment. I explained the diagnosis to the patient. Pain has been managed & has no complaints prior to dc. Pt is comfortable with above plan and is stable for discharge at this time. All questions were answered prior to disposition. Strict return precautions for f/u to the ED were discussed. Encouraged follow up with PCP.   Final Clinical Impressions(s) / ED Diagnoses   Final diagnoses:  Ureterolithiasis    ED Discharge Orders    None       Aaron Edelman 37/35/78 [id number]    Delora Fuel, MD 78/41/28 [phone]

## 2018-04-29 NOTE — ED Notes (Signed)
Pt given graham crackers and ginger ale ?

## 2018-04-29 NOTE — ED Notes (Signed)
ED Provider at bedside. 

## 2018-04-29 NOTE — Discharge Instructions (Addendum)
You have been diagnosed with kidney stones.  Drink plenty of fluids to help you pass the stone.  Take  ibuprofen / naproxen as directed with food for mild to moderate pain. Use your pain medication as directed and only as needed for severe pain. Taking flomax as directed will also help to pass the stone. Use Zofran for nausea as directed.  Follow up with the urology clinic listed in regards to your hospital visit.   Return to the ED immediately if you develop fever that persists > 101, uncontrolled pain or vomiting, or other concerns.   Do not drink alcohol, drive or participate in any other potentially dangerous activities while taking opiate pain medication as it may make you sleepy. Do not take this medication with any other sedating medications, either prescription or over-the-counter. If you were prescribed Percocet or Vicodin, do not take these with acetaminophen (Tylenol) as it is already contained within these medications.   This medication is an opiate (or narcotic) pain medication and can be habit forming.  Use it as little as possible to achieve adequate pain control.  Do not use or use it with extreme caution if you have a history of opiate abuse or dependence. This medication is intended for your use only - do not give any to anyone else and keep it in a secure place where nobody else, especially children, have access to it. It will also cause or worsen constipation, so you may want to consider taking an over-the-counter stool softener while you are taking this medication.  

## 2019-01-02 ENCOUNTER — Other Ambulatory Visit: Payer: Self-pay

## 2019-01-02 DIAGNOSIS — R6889 Other general symptoms and signs: Secondary | ICD-10-CM

## 2019-01-09 LAB — NOVEL CORONAVIRUS, NAA: SARS-CoV-2, NAA: NOT DETECTED

## 2019-03-22 IMAGING — CT CT ABD-PELV W/ CM
2 of 5 series · 16 of 46 positions shown, 18 images · IV contrast (omnipaque)
Comparison: None.

CLINICAL DATA: 56-year-old female with abdominal pain, nausea
vomiting.

EXAM:
CT ABDOMEN AND PELVIS WITH CONTRAST
TECHNIQUE: Multidetector CT imaging of the abdomen and pelvis was performed
using the standard protocol following bolus administration of
intravenous contrast.
CONTRAST:  100mL OMNIPAQUE IOHEXOL 300 MG/ML  SOLN

[Series 3: abdomen 5.0 · axial · 0.74mm/px · z∈[-617,-192]mm · 13 of 97 slices shown, 15 images]
[im 6/97  soft-tissue]
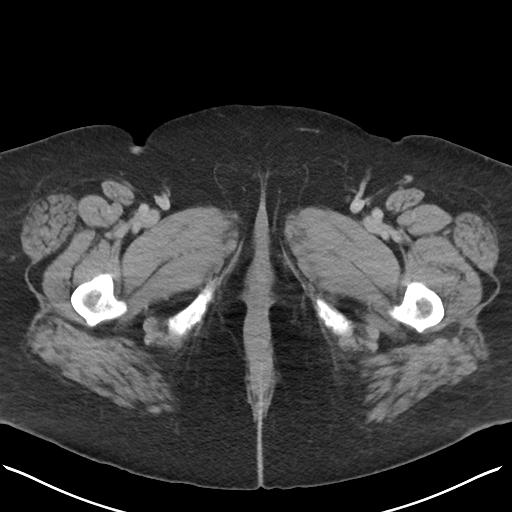
[im 6/97  bone]
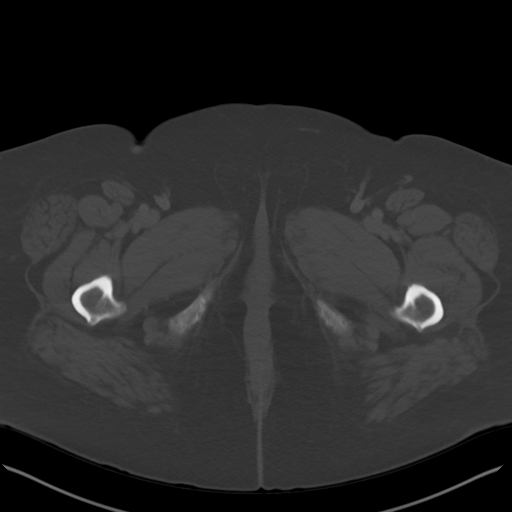
[im 12/97  soft-tissue]
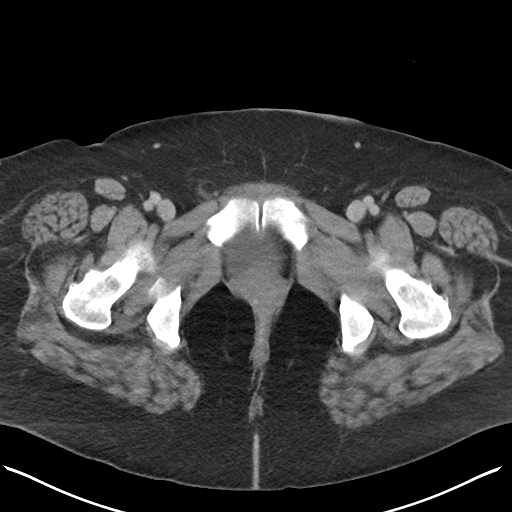
[im 23/97  soft-tissue]
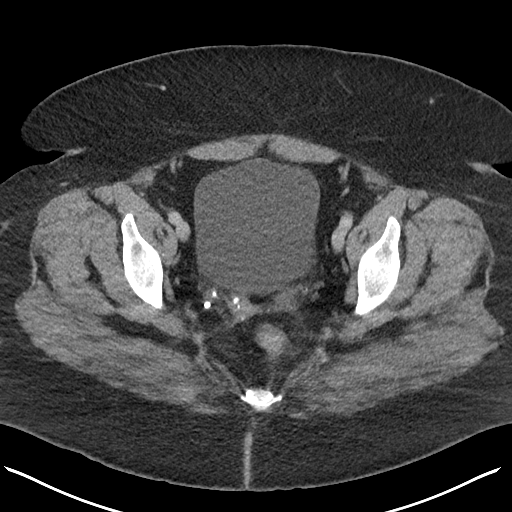
[im 29/97  soft-tissue]
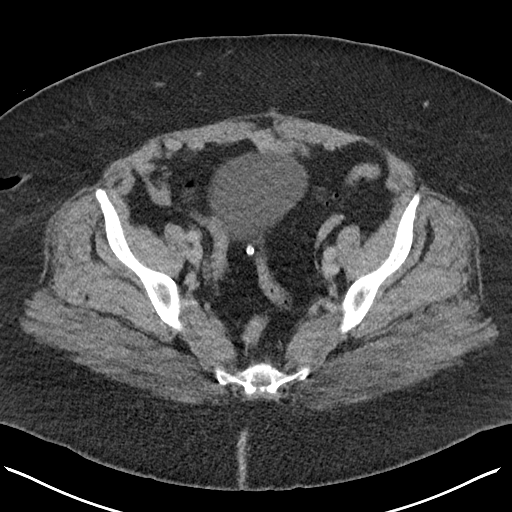
[im 34/97  soft-tissue]
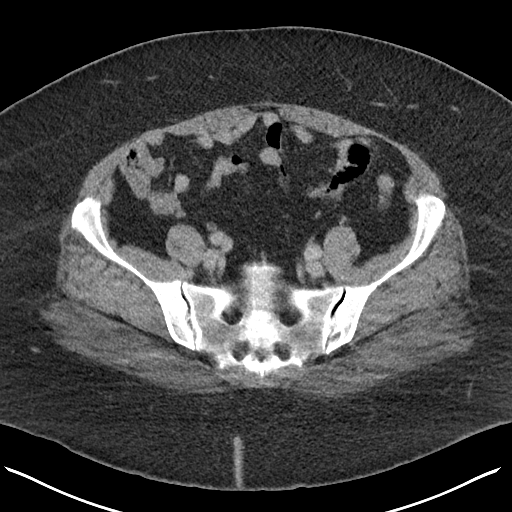
[im 40/97  soft-tissue]
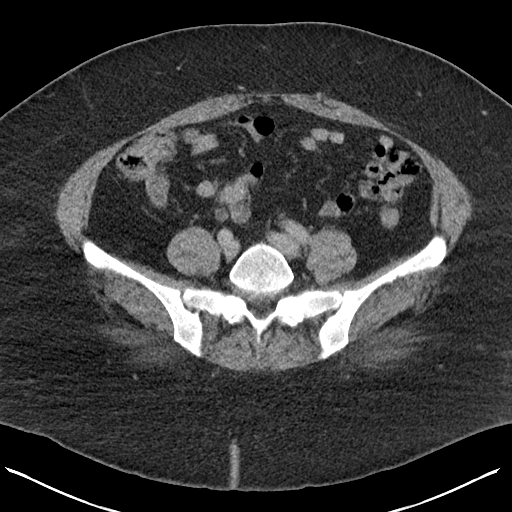
[im 51/97  soft-tissue]
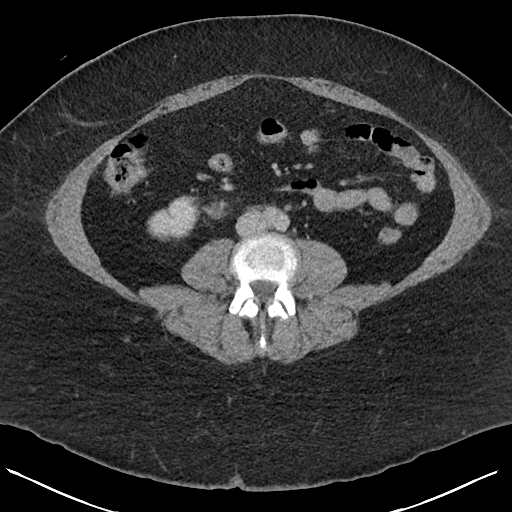
[im 57/97  soft-tissue]
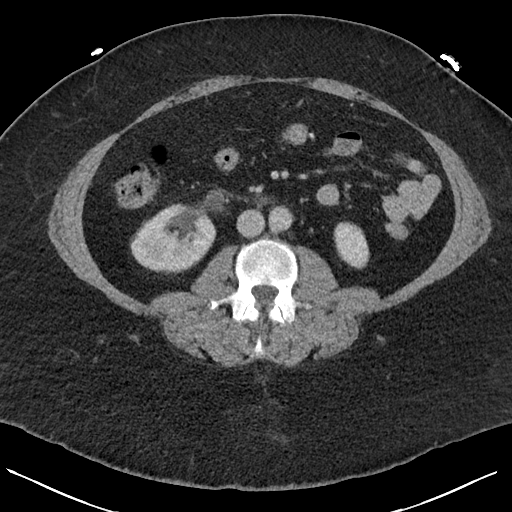
[im 63/97  soft-tissue]
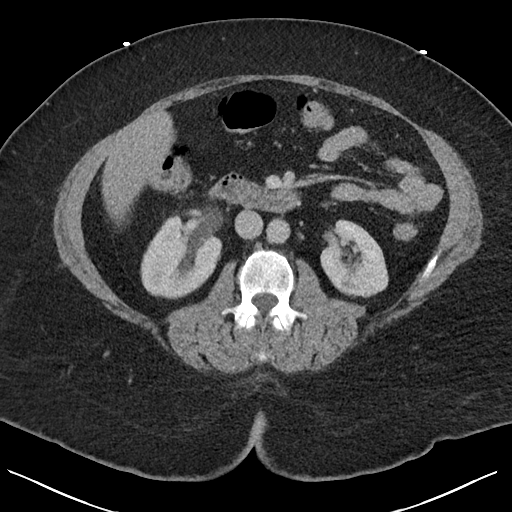
[im 63/97  bone]
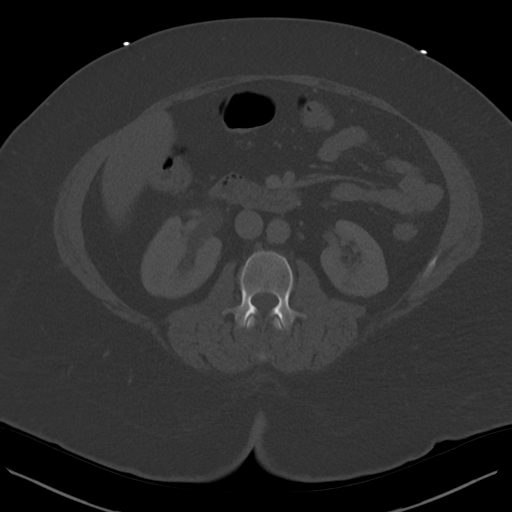
[im 68/97  soft-tissue]
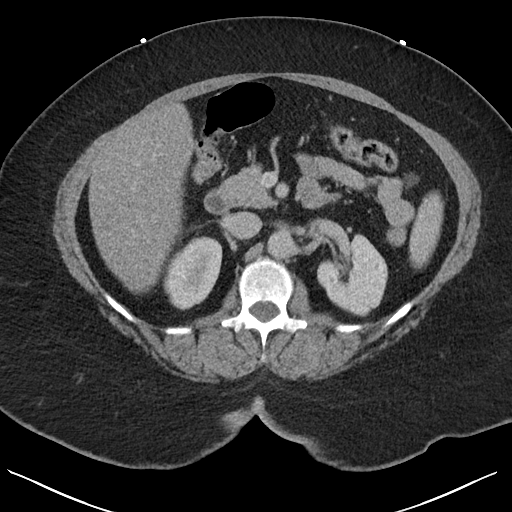
[im 74/97  soft-tissue]
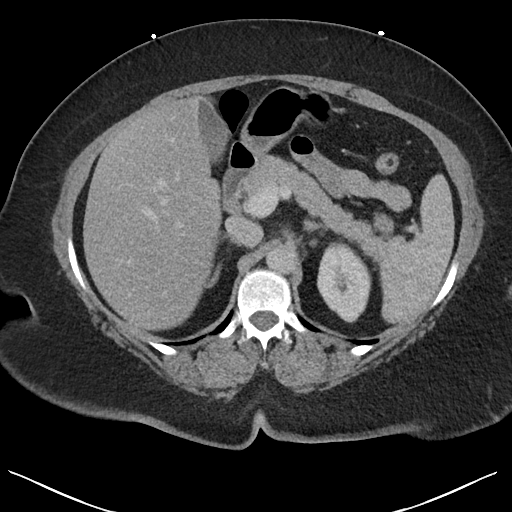
[im 85/97  soft-tissue]
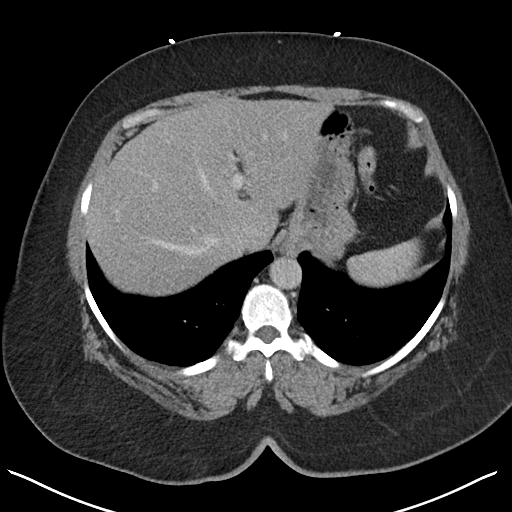
[im 91/97  soft-tissue]
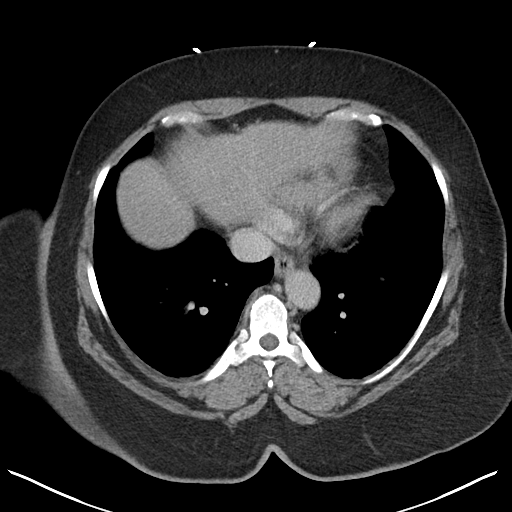

[Series 6: abdomen 3.0 mpr cor · coronal · 0.80mm/px · 3 of 96 slices shown]
[im 32/96  soft-tissue]
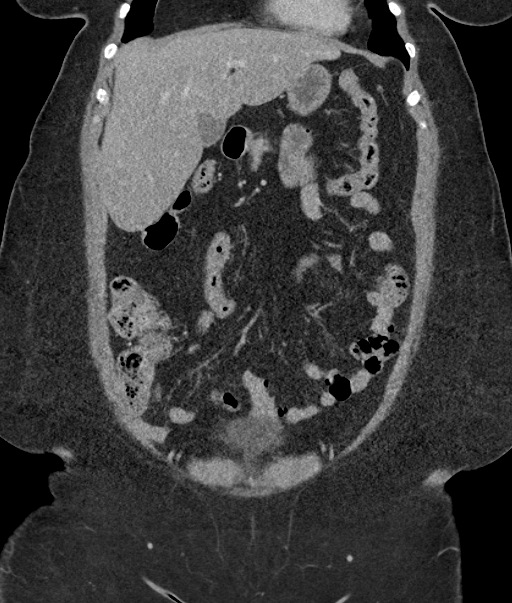
[im 43/96  soft-tissue]
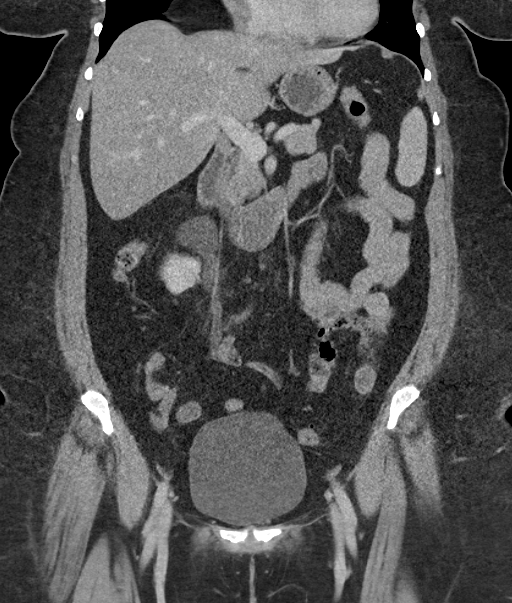
[im 53/96  soft-tissue]
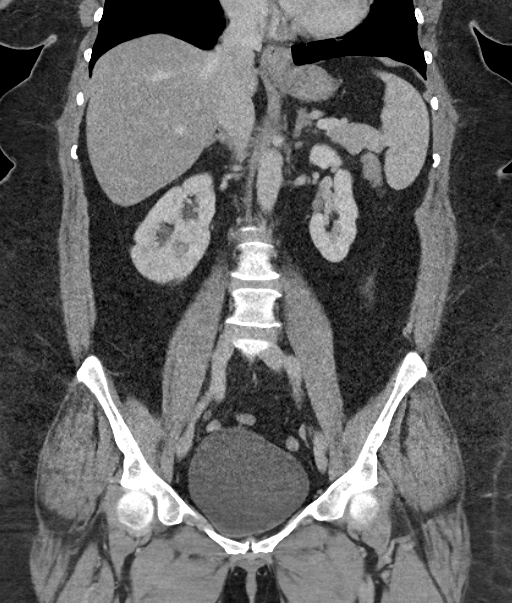

[16 of 46 positions shown; findings below may reference images not displayed]

FINDINGS: Lower chest: The visualized lung bases are clear.

No intra-abdominal free air or free fluid.

Hepatobiliary: Apparent diffuse fatty infiltration of the liver. No
intrahepatic biliary ductal dilatation. The gallbladder is
unremarkable.

Pancreas: Unremarkable. No pancreatic ductal dilatation or
surrounding inflammatory changes.

Spleen: Normal in size without focal abnormality.

Adrenals/Urinary Tract: The adrenal glands are unremarkable. There
is a 5 mm distal ureteral stone with mild right hydronephrosis.
There is a 3 mm nonobstructing left renal interpolar calculus. An 8
mm hypodense lesion in the medial inferior left kidney demonstrates
fat attenuation and likely represents an angiomyolipoma. Additional
subcentimeter left renal hypodense lesions are too small to
characterize. There is no hydronephrosis on the left. The left
ureter and urinary bladder appear unremarkable.

Stomach/Bowel: There is no bowel obstruction or active inflammation.
The appendix is unremarkable.

Vascular/Lymphatic: The abdominal aorta and IVC are unremarkable. No
portal venous gas. There is no adenopathy.

Reproductive: Hysterectomy.  No pelvic mass.

Other: None

Musculoskeletal: No acute or significant osseous findings.
IMPRESSION: 1. A 5 mm distal right ureteral calculus with mild right
hydronephrosis. A small nonobstructing left renal interpolar
calculus. No hydronephrosis on the left.
2. Probable mild fatty liver.
3. No bowel obstruction or active inflammation.  Normal appendix.
4. Probable subcentimeter left renal inferior pole AML.

## 2021-07-19 ENCOUNTER — Encounter: Payer: Self-pay | Admitting: Podiatry

## 2021-07-19 ENCOUNTER — Other Ambulatory Visit: Payer: Self-pay

## 2021-07-19 ENCOUNTER — Ambulatory Visit (INDEPENDENT_AMBULATORY_CARE_PROVIDER_SITE_OTHER): Payer: Medicare Other | Admitting: Podiatry

## 2021-07-19 DIAGNOSIS — E119 Type 2 diabetes mellitus without complications: Secondary | ICD-10-CM

## 2021-07-19 NOTE — Progress Notes (Signed)
  Subjective:  Patient ID: Kelli Estrada, female    DOB: [birth year],   MRN: [id number]  Chief Complaint  Patient presents with   Foot Orthotics    Foot orthotics. Pt was referred here for orthotics or shoes. Pt has knee pain.     60 y.o. female presents for diabetic foot care and possible orthotics. Her PCP is Kellie Shropshire MD. Last A1c was 5.8  .Denies any pain in her feet.  Denies any other pedal complaints. Denies n/v/f/c.   Past Medical History:  Diagnosis Date   Anxiety    Chronic pain    Depression    H/O degenerative disc disease    PTSD (post-traumatic stress disorder)     Objective:  Physical Exam: Vascular: DP/PT pulses 2/4 bilateral. CFT <3 seconds. Normal hair growth on digits. No edema.  Skin. No lacerations or abrasions bilateral feet. Elongated thickened painful toenails.  Musculoskeletal: MMT 5/5 bilateral lower extremities in DF, PF, Inversion and Eversion. Deceased ROM in DF of ankle joint. No pain to palpation  Neurological: Sensation intact to light touch.   Assessment:   1. Type 2 diabetes mellitus without complication, unspecified whether long term insulin use ([place])      Plan:  Patient was evaluated and treated and all questions answered. -Discussed and educated patient on diabetic foot care, especially with  regards to the vascular, neurological and musculoskeletal systems.  -Stressed the importance of good glycemic control and the detriment of not  controlling glucose levels in relation to the foot. -Discussed supportive shoes at all times and checking feet regularly.  -Dispensed prescription for diabetic shoes -Answered all patient questions -Patient to return  in 1 year for diabetic care.  -Patient advised to call the office if any problems or questions arise in the meantime.   Lorenda Peck, DPM

## 2021-07-26 ENCOUNTER — Other Ambulatory Visit: Payer: Self-pay

## 2021-07-26 ENCOUNTER — Ambulatory Visit (INDEPENDENT_AMBULATORY_CARE_PROVIDER_SITE_OTHER): Payer: Medicare Other | Admitting: *Deleted

## 2021-07-26 DIAGNOSIS — M205X1 Other deformities of toe(s) (acquired), right foot: Secondary | ICD-10-CM

## 2021-07-26 DIAGNOSIS — E119 Type 2 diabetes mellitus without complications: Secondary | ICD-10-CM

## 2021-07-26 NOTE — Progress Notes (Signed)
Patient presents to the office today for diabetic shoe and insole measuring.  Patient was measured with brannock device to determine size and width for 1 pair of extra depth shoes and foam casted for 3 pair of insoles.   Documentation of medical necessity will be sent to patient's treating diabetic doctor to verify and sign.   Patient's diabetic provider: Simona Huh, NP (Dr. Kellie Shropshire)  Shoes and insoles will be ordered at that time and patient will be notified for an appointment for fitting when they arrive.   Shoe size (per patient): 8-8.5   Brannock measurement: RIGHT/LEFT - 8 B/C  Patient shoe selection-   1st choice:   Orthofeet 887  2nd choice:  N/A  Shoe size ordered: Women's 8 Medium

## 2021-09-13 ENCOUNTER — Other Ambulatory Visit: Payer: Self-pay

## 2021-09-13 ENCOUNTER — Ambulatory Visit: Payer: Medicare Other

## 2021-09-13 DIAGNOSIS — M2012 Hallux valgus (acquired), left foot: Secondary | ICD-10-CM | POA: Diagnosis not present

## 2021-09-13 DIAGNOSIS — E119 Type 2 diabetes mellitus without complications: Secondary | ICD-10-CM

## 2021-09-13 DIAGNOSIS — M2011 Hallux valgus (acquired), right foot: Secondary | ICD-10-CM | POA: Diagnosis not present

## 2021-09-13 NOTE — Progress Notes (Signed)
SITUATION Reason for Visit: Fitting of Diabetic Shoes & Insoles Patient / Caregiver Report:  Patient is extremely satisfied with fit and function of orthotics  OBJECTIVE DATA: Patient History / Diagnosis:  Diabetes Mellitus Change in Status:   None  ACTIONS PERFORMED: In-Person Delivery, patient was fit with: - 1x pair A5500 PDAC approved prefabricated Diabetic Shoes: Orthofeet 887 boots 85M - 3x pair [id number] PDAC approved CAM milled custom diabetic insoles  Shoes and insoles were verified for structural integrity and safety. Patient wore shoes and insoles in office. Skin was inspected and free of areas of concern after wearing shoes and inserts. Shoes and inserts fit properly. Patient / Caregiver provided with ferbal instruction and demonstration regarding donning, doffing, wear, care, proper fit, function, purpose, cleaning, and use of shoes and insoles ' and in all related precautions and risks and benefits regarding shoes and insoles. Patient / Caregiver was instructed to wear properly fitting socks with shoes at all times. Patient was also provided with verbal instruction regarding how to report any failures or malfunctions of shoes or inserts, and necessary follow up care. Patient / Caregiver was also instructed to contact physician regarding change in status that may affect function of shoes and inserts.   Patient / Caregiver verbalized undersatnding of instruction provided. Patient / Caregiver demonstrated independence with proper donning and doffing of shoes and inserts.  PLAN Patient to follow up as needed. Plan of care was discussed with and agreed upon by patient and/or caregiver. All questions were answered and concerns addressed.

## 2022-05-28 ENCOUNTER — Encounter (HOSPITAL_COMMUNITY): Payer: Self-pay | Admitting: Emergency Medicine

## 2022-05-28 ENCOUNTER — Emergency Department (HOSPITAL_COMMUNITY)
Admission: EM | Admit: 2022-05-28 | Discharge: 2022-05-28 | Disposition: A | Discharge status: Home / Self Care | Payer: Medicare Other | Attending: Emergency Medicine | Admitting: Emergency Medicine

## 2022-05-28 ENCOUNTER — Emergency Department (HOSPITAL_COMMUNITY): Payer: Medicare Other

## 2022-05-28 ENCOUNTER — Other Ambulatory Visit: Payer: Self-pay

## 2022-05-28 DIAGNOSIS — R1032 Left lower quadrant pain: Secondary | ICD-10-CM | POA: Diagnosis present

## 2022-05-28 DIAGNOSIS — N201 Calculus of ureter: Secondary | ICD-10-CM | POA: Diagnosis not present

## 2022-05-28 DIAGNOSIS — R112 Nausea with vomiting, unspecified: Secondary | ICD-10-CM | POA: Insufficient documentation

## 2022-05-28 LAB — URINALYSIS, ROUTINE W REFLEX MICROSCOPIC
Bilirubin Urine: NEGATIVE
Glucose, UA: NEGATIVE mg/dL
Ketones, ur: NEGATIVE mg/dL
Nitrite: NEGATIVE
Protein, ur: 30 mg/dL — AB
Specific Gravity, Urine: 1.023 (ref 1.005–1.030)
pH: 5 (ref 5.0–8.0)

## 2022-05-28 LAB — BASIC METABOLIC PANEL
Anion gap: 9 (ref 5–15)
BUN: 19 mg/dL (ref 6–20)
CO2: 21 mmol/L — ABNORMAL LOW (ref 22–32)
Calcium: 9.4 mg/dL (ref 8.9–10.3)
Chloride: 108 mmol/L (ref 98–111)
Creatinine, Ser: 0.94 mg/dL (ref 0.44–1.00)
GFR, Estimated: >60 mL/min (ref >60)
Glucose, Bld: 143 mg/dL — ABNORMAL HIGH (ref 70–99)
Potassium: 3.5 mmol/L (ref 3.5–5.1)
Sodium: 138 mmol/L (ref 135–145)

## 2022-05-28 LAB — CBC WITH DIFFERENTIAL/PLATELET
Abs Immature Granulocytes: 0.04 10*3/uL (ref 0.00–0.07)
Basophils Absolute: 0.1 10*3/uL (ref 0.0–0.1)
Basophils Relative: 1 %
Eosinophils Absolute: 0.1 10*3/uL (ref 0.0–0.5)
Eosinophils Relative: 1 %
HCT: 41.8 % (ref 36.0–46.0)
Hemoglobin: 13.8 g/dL (ref 12.0–15.0)
Immature Granulocytes: 1 %
Lymphocytes Relative: 14 %
Lymphs Abs: 1.2 10*3/uL (ref 0.7–4.0)
MCH: 28.9 pg (ref 26.0–34.0)
MCHC: 33 g/dL (ref 30.0–36.0)
MCV: 87.4 fL (ref 80.0–100.0)
Monocytes Absolute: 0.5 10*3/uL (ref 0.1–1.0)
Monocytes Relative: 6 %
Neutro Abs: 6.9 10*3/uL (ref 1.7–7.7)
Neutrophils Relative %: 77 %
Platelets: 164 10*3/uL (ref 150–400)
RBC: 4.78 MIL/uL (ref 3.87–5.11)
RDW: 14.3 % (ref 11.5–15.5)
WBC: 8.7 10*3/uL (ref 4.0–10.5)
nRBC: 0 % (ref 0.0–0.2)

## 2022-05-28 MED ORDER — ONDANSETRON HCL 4 MG/2ML IJ SOLN
4.0000 mg | Freq: Once | INTRAMUSCULAR | Status: AC
  Administered 2022-05-28: 4 mg via INTRAVENOUS
  Filled 2022-05-28: qty 2

## 2022-05-28 MED ORDER — KETOROLAC TROMETHAMINE 30 MG/ML IJ SOLN
15.0000 mg | Freq: Once | INTRAMUSCULAR | Status: AC
Start: 2022-05-28 — End: 2022-05-28
  Administered 2022-05-28: 15 mg via INTRAVENOUS
  Filled 2022-05-28: qty 1

## 2022-05-28 MED ORDER — TAMSULOSIN HCL 0.4 MG PO CAPS: 0.4000 mg | ORAL_CAPSULE | Freq: Every day | ORAL | 0 refills | Status: DC

## 2022-05-28 MED ORDER — CEPHALEXIN 500 MG PO CAPS: 500.0000 mg | ORAL_CAPSULE | Freq: Two times a day (BID) | ORAL | 0 refills | Status: DC

## 2022-05-28 MED ORDER — HYDROMORPHONE HCL 1 MG/ML IJ SOLN
1.0000 mg | Freq: Once | INTRAMUSCULAR | Status: AC
  Administered 2022-05-28: 1 mg via INTRAVENOUS
  Filled 2022-05-28: qty 1

## 2022-05-28 NOTE — ED Triage Notes (Signed)
Pt with L sided flank pain.

## 2022-05-28 NOTE — ED Provider Notes (Addendum)
[facility] EMERGENCY DEPARTMENT Provider Note   CSN: [id number] Arrival date & time: 05/28/22  0407     History  Chief Complaint  Patient presents with   Flank Pain    Kelli Estrada is a 61 y.o. female.  Presents to the emergency department for evaluation of left-sided flank pain.  Pain starts in the low back and radiates around to the front of the abdomen.  She has had nausea and vomiting.  This feels similar to when she had a kidney stone in 2019.       Home Medications Prior to Admission medications   Medication Sig Start Date End Date Taking? Authorizing Provider  albuterol (VENTOLIN HFA) 108 (90 Estrada) MCG/ACT inhaler SMARTSIG:1 Puff(s) Via Inhaler 4 Times Daily PRN 04/06/21   [provider]  alendronate (FOSAMAX) 70 MG tablet Take 70 mg by mouth once a week. 06/28/21   [provider]  Blood Glucose Monitoring Suppl (ONETOUCH VERIO REFLECT) w/Device KIT See admin instructions. 04/03/21   [provider]  [employer] CALCIUM 600 & VITAMIN D3 600-800 MG-UNIT TABS Take 1 tablet by mouth daily. 03/13/21   [provider]  [employer] VITAMIN E 180 MG (400 UNIT) CAPS Take 2 capsules by mouth daily. 04/08/21   [provider]  doxepin (SINEQUAN) 25 MG capsule Take 25 mg by mouth at bedtime. 04/16/18   [provider]  gabapentin (NEURONTIN) 300 MG capsule Take 300 mg by mouth 3 (three) times daily. 02/24/16   [provider]  hydrOXYzine (VISTARIL) 25 MG capsule Take 25 mg by mouth 2 (two) times daily as needed for anxiety.  04/16/18   [provider]  metFORMIN (GLUCOPHAGE) 500 MG tablet SMARTSIG:1 Tablet(s) By Mouth Every Evening 05/23/21   [provider]  ondansetron (ZOFRAN ODT) 4 MG disintegrating tablet Take 1 tablet (4 mg total) by mouth every 8 (eight) hours as needed for nausea or vomiting. 04/29/18   Leaphart, Zack Seal, PA-C  Oxcarbazepine (TRILEPTAL) 300 MG tablet Take 300 mg by mouth 2 (two) times daily. 04/03/15    [provider]  oxyCODONE-acetaminophen (PERCOCET/ROXICET) 5-325 MG tablet Take 1 tablet by mouth every 6 (six) hours as needed (For pain.).  06/03/15   [provider]  OZEMPIC, 1 MG/DOSE, 4 MG/3ML SOPN Inject 1 mg into the skin once a week. 03/10/21   [provider]  rosuvastatin (CRESTOR) 20 MG tablet Take 20 mg by mouth at bedtime. 07/18/21   [provider]  sertraline (ZOLOFT) 100 MG tablet Take 100 mg by mouth 2 (two) times daily. 02/21/16   [provider]  topiramate (TOPAMAX) 100 MG tablet Take 100 mg by mouth daily. 07/16/21   [provider]      Allergies    Patient has no known allergies.    Review of Systems   Review of Systems  Physical Exam Updated Vital Signs BP (!) 136/111 (BP Location: Right Arm)   Pulse 61   Temp 97.7 F (36.5 C) (Oral)   Resp 20   Ht '5\' 3"'  (1.6 m)   Wt 108 kg   SpO2 99%   BMI 42.16 kg/m  Physical Exam Vitals and nursing note reviewed.  Constitutional:      General: She is not in acute distress.    Appearance: She is well-developed.  HENT:     Head: Normocephalic and atraumatic.     Mouth/Throat:     Mouth: Mucous membranes are moist.  Eyes:     General: Vision  grossly intact. Gaze aligned appropriately.     Extraocular Movements: Extraocular movements intact.     Conjunctiva/sclera: Conjunctivae normal.  Cardiovascular:     Rate and Rhythm: Normal rate and regular rhythm.     Pulses: Normal pulses.     Heart sounds: Normal heart sounds, S1 normal and S2 normal. No murmur heard.    No friction rub. No gallop.  Pulmonary:     Effort: Pulmonary effort is normal. No respiratory distress.     Breath sounds: Normal breath sounds.  Abdominal:     General: Bowel sounds are normal.     Palpations: Abdomen is soft.     Tenderness: There is no abdominal tenderness. There is no guarding or rebound.     Hernia: No hernia is present.  Musculoskeletal:        General: No swelling.      Cervical back: Full passive range of motion without pain, normal range of motion and neck supple. No spinous process tenderness or muscular tenderness. Normal range of motion.     Right lower leg: No edema.     Left lower leg: No edema.  Skin:    General: Skin is warm and dry.     Capillary Refill: Capillary refill takes less than 2 seconds.     Findings: No ecchymosis, erythema, rash or wound.  Neurological:     General: No focal deficit present.     Mental Status: She is alert and oriented to person, place, and time.     GCS: GCS eye subscore is 4. GCS verbal subscore is 5. GCS motor subscore is 6.     Cranial Nerves: Cranial nerves 2-12 are intact.     Sensory: Sensation is intact.     Motor: Motor function is intact.     Coordination: Coordination is intact.  Psychiatric:        Attention and Perception: Attention normal.        Mood and Affect: Mood normal.        Speech: Speech normal.        Behavior: Behavior normal.     ED Results / Procedures / Treatments   Labs (all labs ordered are listed, but only abnormal results are displayed) Labs Reviewed  BASIC METABOLIC PANEL - Abnormal; Notable for the following components:      Result Value   CO2 21 (*)    Glucose, Bld 143 (*)    All other components within normal limits  CBC WITH DIFFERENTIAL/PLATELET  URINALYSIS, ROUTINE W REFLEX MICROSCOPIC    EKG None  Radiology CT RENAL STONE STUDY  Result Date: 05/28/2022 CLINICAL DATA:  Left lower quadrant pain. EXAM: CT ABDOMEN AND PELVIS WITHOUT CONTRAST TECHNIQUE: Multidetector CT imaging of the abdomen and pelvis was performed following the standard protocol without IV contrast. RADIATION DOSE REDUCTION: This exam was performed according to the departmental dose-optimization program which includes automated exposure control, adjustment of the mA and/or kV according to patient size and/or use of iterative reconstruction technique. COMPARISON:  April 29, 2018 FINDINGS: Lower  chest: No acute abnormality. Hepatobiliary: No focal liver abnormality is seen. No gallstones, gallbladder wall thickening, or biliary dilatation. Pancreas: Unremarkable. No pancreatic ductal dilatation or surrounding inflammatory changes. Spleen: Normal in size without focal abnormality. Adrenals/Urinary Tract: Adrenal glands are unremarkable. Kidneys are normal in size, without focal lesions. A 3 mm nonobstructing renal calculus is seen within the posterior aspect of the mid to lower left kidney. An additional 3 mm obstructing renal calculus  is seen at the left UVJ. Mild to moderate severity left-sided hydronephrosis and hydroureter are seen. The urinary bladder is poorly distended and subsequently limited in evaluation. Stomach/Bowel: Stomach is within normal limits. The appendix is not clearly identified. No evidence of bowel wall thickening, distention, or inflammatory changes. Noninflamed diverticula are seen throughout the sigmoid colon. Vascular/Lymphatic: No significant vascular findings are present. No enlarged abdominal or pelvic lymph nodes. Reproductive: Status post hysterectomy. No adnexal masses. Other: No abdominal wall hernia or abnormality. No abdominopelvic ascites. Musculoskeletal: No acute or significant osseous findings. IMPRESSION: 1. 3 mm obstructing renal calculus at the left UVJ. 2. 3 mm nonobstructing left renal calculus. 3. Sigmoid diverticulosis. Electronically Signed   By: Virgina Norfolk M.D.   On: 05/28/2022 05:03    Procedures Procedures    Medications Ordered in ED Medications  ketorolac (TORADOL) 30 MG/ML injection 15 mg (has no administration in time range)  HYDROmorphone (DILAUDID) injection 1 mg (1 mg Intravenous Given 05/28/22 0431)  ondansetron (ZOFRAN) injection 4 mg (4 mg Intravenous Given 05/28/22 0431)    ED Course/ Medical Decision Making/ A&P                           Medical Decision Making Amount and/or Complexity of Data Reviewed Labs:  ordered. Radiology: ordered.  Risk Prescription drug management.   Presents to the emergency department for evaluation of left flank pain with nausea and vomiting.  Work-up reveals 3 mm left UVJ stone and explains patient's symptoms.  Check urinalysis before discharge to ensure no infection.  Discharged with analgesia.  Addendum: Urinalysis with 21-50 white cells and red cells.  There are, however, no bacteria, negative esterase, negative nitrite.  Suspect this is all secondary to stone, not infection.  We will give antibiotics empirically, given return precautions for signs of infection.      Final Clinical Impression(s) / ED Diagnoses Final diagnoses:  Ureterolithiasis    Rx / DC Orders ED Discharge Orders          Ordered    Ambulatory referral to Urology        05/28/22 0540              Orpah Greek, MD 05/28/22 0540    Orpah Greek, MD 05/28/22 [id number]

## 2022-05-29 ENCOUNTER — Telehealth: Payer: Self-pay

## 2022-05-29 DIAGNOSIS — N200 Calculus of kidney: Secondary | ICD-10-CM

## 2022-05-29 NOTE — Telephone Encounter (Signed)
Patient was seen in ER yesterday for Left Kidney Stones, she advised she needed to schedule an appt. Out next available is with Dr. Junious Silk on Monday. I wanted to verify if patient was ok to wait until then or if it was more urgent.

## 2022-05-29 NOTE — Telephone Encounter (Signed)
Appt scheduled with PA to see patient.

## 2022-05-30 ENCOUNTER — Ambulatory Visit (HOSPITAL_COMMUNITY)
Admission: RE | Admit: 2022-05-30 | Discharge: 2022-05-30 | Disposition: A | Discharge status: Home / Self Care | Payer: Medicare Other | Source: Ambulatory Visit | Attending: Physician Assistant | Admitting: Physician Assistant

## 2022-05-30 ENCOUNTER — Ambulatory Visit (INDEPENDENT_AMBULATORY_CARE_PROVIDER_SITE_OTHER): Payer: Medicare Other | Admitting: Physician Assistant

## 2022-05-30 VITALS — BP 98/64 | HR 71

## 2022-05-30 DIAGNOSIS — N200 Calculus of kidney: Secondary | ICD-10-CM | POA: Diagnosis present

## 2022-05-30 DIAGNOSIS — N201 Calculus of ureter: Secondary | ICD-10-CM | POA: Diagnosis not present

## 2022-05-30 MED ORDER — TAMSULOSIN HCL 0.4 MG PO CAPS: 0.4000 mg | ORAL_CAPSULE | Freq: Every day | ORAL | 0 refills | Status: DC

## 2022-05-30 MED ORDER — ONDANSETRON 4 MG PO TBDP: 4.0000 mg | ORAL_TABLET | Freq: Three times a day (TID) | ORAL | 0 refills | Status: DC | PRN

## 2022-05-30 NOTE — Progress Notes (Signed)
05/30/2022 12:54 PM   Kelli Estrada [birth year] [id number]   Assessment:  1. Kidney stones - Urinalysis, Routine w reflex microscopic - Ultrasound renal complete; Future  2. Left ureteral stone - Ultrasound renal complete; Future    Plan: Complete scription for Keflex as given in the emergency department.  She will continue Flomax and strain her urine to bring for stone analysis when she follows up approximately 6 weeks after renal ultrasound.  Stone prevention diet recommendations given.  Turn to ED precautions discussed.  Chief Complaint: No chief complaint on file.   Referring provider: Orpah Greek, MD [place],  [place] [id number]   History of Present Illness:  Kelli Estrada is a 61 y.o. year old female with a history of urinary stone disease who is seen in consultation from Sherald Hess., MD  for evaluation of left-sided flank pain, nausea and vomiting and was found to have left sided UVJ and renal stone as seen in the ED on 8/11.  Her last stone event prior to this was in 2019.  Patient has not been straining her urine, but does not feel she is passed the stone.  She is taking Flomax as prescribed in the emergency department.  Today, she has complaints of urinary frequency, urgency, occasional incontinence.   Persistent flank pain. No fever or chills.  No gross hematuria.  Other medical history is significant for obstructive sleep apnea, has not worn CPAP in more than 3 years.  She is treated for diabetes mellitus and last A1c was 5.5 in July 23.  Patient is also on long-term oxycodone for chronic pain  UA=0-5 WBC, 3-10 RBC, no bact. KUB= opacity noted mid left abdomen consistent with distal ureteral stone. Medical records from the emergency department including visit notes, labs and imaging study reviewed during patient's office visit.   Past Medical History:  Past Medical History:  Diagnosis Date   Anxiety    Chronic pain    Depression     H/O degenerative disc disease    PTSD (post-traumatic stress disorder)     Past Surgical History:  Past Surgical History:  Procedure Laterality Date   ABDOMINAL HYSTERECTOMY     COLONOSCOPY WITH PROPOFOL N/A 03/15/2016   Procedure: COLONOSCOPY WITH PROPOFOL;  Surgeon: Teena Irani, MD;  Location: [facility];  Service: Endoscopy;  Laterality: N/A;   NECK SURGERY      Allergies:  No Known Allergies  Family History:  No family history on file.  Social History:  Social History   Tobacco Use   Smoking status: Never  Substance Use Topics   Alcohol use: Yes    Comment: occasionally    Drug use: No    Review of symptoms:  Constitutional:  Negative for unexplained weight loss, night sweats, fever, chills ENT:  Negative for nose bleeds, sinus pain, painful swallowing CV:  Negative for chest pain, shortness of breath, exercise intolerance, palpitations, loss of consciousness Resp:  Negative for cough, wheezing, shortness of breath GI:  Negative for nausea, vomiting, diarrhea, bloody stools GU:  Positives noted in HPI; otherwise negative for gross hematuria Neuro:  Negative for seizures, poor balance, limb weakness, slurred speech Psych:  Negative for lack of energy, depression, anxiety Endocrine:  Negative for polydipsia, polyuria, symptoms of hypoglycemia (dizziness, hunger, sweating) Hematologic:  Negative for anemia, purpura, petechia, prolonged or excessive bleeding, use of anticoagulants   Physical Exam: BP 98/64   Pulse 71   Constitutional:  Alert and oriented, No acute  distress. HEENT: NCAT, moist mucus membranes.  Trachea midline, no masses. Cardiovascular: Regular rate and rhythm without murmur, rub, or gallops Respiratory: Normal respiratory effort, clear to auscultation bilaterally GI: Abdomen is soft, nontender, nondistended, no abdominal masses BACK:  Non-tender to palpation.  Mild left sided CVAT Lymph: No cervical or inguinal lymphadenopathy. Skin: No obvious  rashes, warm, dry, intact Neurologic: Alert and oriented, Cranial nerves grossly intact, no focal deficits, moving all 4 extremities. Psychiatric: Appropriate. Normal mood and affect.  Laboratory Data: No results found for this or any previous visit (from the past 24 hour(s)).  Lab Results  Component Value Date   WBC 8.7 05/28/2022   HGB 13.8 05/28/2022   HCT 41.8 05/28/2022   MCV 87.4 05/28/2022   PLT 164 05/28/2022    Lab Results  Component Value Date   CREATININE 0.94 05/28/2022     Urinalysis    Component Value Date/Time   COLORURINE YELLOW 05/28/2022 0419   APPEARANCEUR CLOUDY (A) 05/28/2022 0419   LABSPEC 1.023 05/28/2022 0419   PHURINE 5.0 05/28/2022 0419   GLUCOSEU NEGATIVE 05/28/2022 0419   HGBUR MODERATE (A) 05/28/2022 0419   BILIRUBINUR NEGATIVE 05/28/2022 0419   KETONESUR NEGATIVE 05/28/2022 0419   PROTEINUR 30 (A) 05/28/2022 0419   NITRITE NEGATIVE 05/28/2022 0419   LEUKOCYTESUR TRACE (A) 05/28/2022 0419    Lab Results  Component Value Date   BACTERIA FEW (A) 05/28/2022    Pertinent Imaging: No results found for this or any previous visit.  No results found for this or any previous visit.     [place]lin, Berneice Heinrich, PA-C [facility] [place]

## 2022-05-31 LAB — MICROSCOPIC EXAMINATION: Bacteria, UA: NONE SEEN

## 2022-05-31 LAB — URINALYSIS, ROUTINE W REFLEX MICROSCOPIC
Bilirubin, UA: NEGATIVE
Glucose, UA: NEGATIVE
Ketones, UA: NEGATIVE
Leukocytes,UA: NEGATIVE
Nitrite, UA: NEGATIVE
Specific Gravity, UA: 1.02 (ref 1.005–1.030)
Urobilinogen, Ur: 1 mg/dL (ref 0.2–1.0)
pH, UA: 5 (ref 5.0–7.5)

## 2022-06-28 ENCOUNTER — Other Ambulatory Visit: Payer: Self-pay | Admitting: Physician Assistant

## 2022-06-28 DIAGNOSIS — N201 Calculus of ureter: Secondary | ICD-10-CM

## 2022-07-05 ENCOUNTER — Ambulatory Visit (HOSPITAL_COMMUNITY)
Admission: RE | Admit: 2022-07-05 | Discharge: 2022-07-05 | Disposition: A | Discharge status: Home / Self Care | Payer: Medicare Other | Source: Ambulatory Visit | Attending: Physician Assistant | Admitting: Physician Assistant

## 2022-07-05 DIAGNOSIS — N200 Calculus of kidney: Secondary | ICD-10-CM | POA: Insufficient documentation

## 2022-07-05 DIAGNOSIS — N201 Calculus of ureter: Secondary | ICD-10-CM | POA: Diagnosis present

## 2022-07-09 ENCOUNTER — Ambulatory Visit (INDEPENDENT_AMBULATORY_CARE_PROVIDER_SITE_OTHER): Payer: Medicare Other | Admitting: Physician Assistant

## 2022-07-09 VITALS — BP 125/94 | HR 67

## 2022-07-09 DIAGNOSIS — N200 Calculus of kidney: Secondary | ICD-10-CM

## 2022-07-09 DIAGNOSIS — N3281 Overactive bladder: Secondary | ICD-10-CM

## 2022-07-09 DIAGNOSIS — Z9889 Other specified postprocedural states: Secondary | ICD-10-CM | POA: Diagnosis not present

## 2022-07-09 DIAGNOSIS — N3946 Mixed incontinence: Secondary | ICD-10-CM | POA: Diagnosis not present

## 2022-07-09 DIAGNOSIS — R8281 Pyuria: Secondary | ICD-10-CM

## 2022-07-09 LAB — BLADDER SCAN AMB NON-IMAGING: Scan Result: 0

## 2022-07-09 MED ORDER — MIRABEGRON ER 25 MG PO TB24: 25.0000 mg | ORAL_TABLET | Freq: Every day | ORAL | 0 refills | Status: DC

## 2022-07-09 NOTE — Progress Notes (Signed)
post void residual=0 ?

## 2022-07-09 NOTE — Progress Notes (Signed)
Assessment: 1. Kidney stones - Urinalysis, Routine w reflex microscopic - DG Abd 1 View; Future  2. OAB (overactive bladder) - mirabegron ER (MYRBETRIQ) 25 MG TB24 tablet; Take 1 tablet (25 mg total) by mouth daily.  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 History of bladder surgery  5. Pyuria - Urine Culture    Plan: Cx ordered. No tx as pt is asymptomatic. Stone prevention discussed again with patient.  Diet recommendations given.  She will drop her stone fragment that she is collected for stone analysis at her first convenient time.  1 year follow-up after KUB for history of stones. If pt elects to proceed with ESWL for non-obstructing stone, she will FU to discuss. Significant amount of time spent discussing the patient's OAB symptoms and treatment options.  She is advised a follow-up visit for full exam and complete history is indicated.  Samples of Myrbetriq given and she will follow-up in 2 to 3 weeks for pelvic and urinalysis with PVR.  Consent for medical records from her gynecologist at [facility].  Orders Placed This Encounter  Procedures   Urine Culture   Microscopic Examination   DG Abd 1 View    Standing Status:   Future    Standing Expiration Date:   07/10/2023    Order Specific Question:   Reason for Exam (SYMPTOM  OR DIAGNOSIS REQUIRED)    Answer:   nephrolithiasis    Order Specific Question:   Is patient pregnant?    Answer:   No    Order Specific Question:   Preferred imaging location?    Answer:   [facility]   Urinalysis, Routine w reflex microscopic   Bladder Scan (Post Void Residual) in office     Chief Complaint: No chief complaint on file.   HPI: Kelli Estrada is a 61 y.o. female who presents for continued evaluation of nephrolithiasis. She has passed UVJ stone noted in  August. Doing well without flank pain, gross hematuria, fever, chills. Pt also requests eval of LUTs ongoing for many years including MUI, frequency, urgency, and nocturia. She wears multiple pads daily and voids 2-3 x at night. No h/o meds for this. She is post bladder suspension in the past. No burning, dysuria. GYN has told pt she has "bladder drop". A1c 5.5 in July UA= 6-10WBC and few bacteria PVR=85m  Renal ultrasound indicates 8 mm nonobstructive stone in the left kidney.  No hydroureteronephrosis noted.  Left-sided hydro has resolved.  05/30/22 RKarlee Estrada a 61y.o. year old female with a history of urinary stone disease who is seen in consultation from FSherald Hess, MD  for evaluation of left-sided flank pain, nausea and vomiting and was found to have left sided UVJ and renal stone as seen in the ED on 8/11.  Her last stone event prior to this was in 2019.  Patient has not been straining her urine, but does not feel she is passed the stone.  She is taking Flomax as prescribed in the emergency department.  Today, she has complaints of urinary frequency, urgency, occasional incontinence.   Persistent flank pain. No fever or chills.  No gross hematuria.  Other medical history is significant for obstructive sleep apnea, has not worn CPAP in more than 3 years.  She is treated for diabetes mellitus and  last A1c was 5.5 in July 23.  Patient is also on long-term oxycodone for chronic pain   UA=0-5 WBC, 3-10 RBC, no bact. KUB= opacity noted mid left abdomen consistent with distal ureteral stone. Portions of the above documentation were copied from a prior visit for review purposes only.  Allergies: No Known Allergies  PMH: Past Medical History:  Diagnosis Date   Anxiety    Chronic pain    Depression    H/O degenerative disc disease    PTSD (post-traumatic stress disorder)     PSH: Past Surgical History:  Procedure Laterality Date   ABDOMINAL HYSTERECTOMY     COLONOSCOPY WITH  PROPOFOL N/A 03/15/2016   Procedure: COLONOSCOPY WITH PROPOFOL;  Surgeon: Teena Irani, MD;  Location: [facility];  Service: Endoscopy;  Laterality: N/A;   NECK SURGERY      SH: Social History   Tobacco Use   Smoking status: Never  Substance Use Topics   Alcohol use: Yes    Comment: occasionally    Drug use: No    ROS: All other review of systems were reviewed and are negative except what is noted above in HPI  PE: BP (!) 125/94   Pulse 67  GENERAL APPEARANCE:  Well appearing, well developed, well nourished, NAD HEENT:  Atraumatic, normocephalic NECK:  Supple. Trachea midline ABDOMEN:  Soft, non-tender, no masses EXTREMITIES:  Moves all extremities well, without clubbing, cyanosis, or edema NEUROLOGIC:  Alert and oriented x 3, normal gait, CN II-XII grossly intact MENTAL STATUS:  appropriate BACK:  Non-tender to palpation, No CVAT SKIN:  Warm, dry, and intact   Results: Laboratory Data: Lab Results  Component Value Date   WBC 8.7 05/28/2022   HGB 13.8 05/28/2022   HCT 41.8 05/28/2022   MCV 87.4 05/28/2022   PLT 164 05/28/2022    Lab Results  Component Value Date   CREATININE 0.94 05/28/2022    Urinalysis    Component Value Date/Time   COLORURINE YELLOW 05/28/2022 0419   APPEARANCEUR Cloudy (A) 07/09/2022 1440   LABSPEC 1.023 05/28/2022 0419   PHURINE 5.0 05/28/2022 0419   GLUCOSEU Negative 07/09/2022 1440   HGBUR MODERATE (A) 05/28/2022 0419   BILIRUBINUR Negative 07/09/2022 1440   KETONESUR NEGATIVE 05/28/2022 0419   PROTEINUR Trace (A) 07/09/2022 1440   PROTEINUR 30 (A) 05/28/2022 0419   NITRITE Negative 07/09/2022 1440   NITRITE NEGATIVE 05/28/2022 0419   LEUKOCYTESUR Trace (A) 07/09/2022 1440   LEUKOCYTESUR TRACE (A) 05/28/2022 0419    Lab Results  Component Value Date   LABMICR See below: 07/09/2022   WBCUA 6-10 (A) 07/09/2022   LABEPIT 0-10 07/09/2022   BACTERIA Few 07/09/2022    Pertinent Imaging: Results for orders placed during the  hospital encounter of 05/30/22  DG Abd 1 View  Narrative CLINICAL DATA:  Left flank pain.  EXAM: ABDOMEN - 1 VIEW  COMPARISON:  None Available.  FINDINGS: The bowel gas pattern is normal. A moderate to large stool burden is seen involving the ascending and distal transverse colon. A 5 mm soft tissue calcification is seen overlying the medial aspect of the mid left abdomen.  IMPRESSION: 1. Suspected 5 mm left ureteral calculus.   Electronically Signed By: Virgina Norfolk M.D. On: 05/31/2022 23:55  No results found for this or any previous visit.  No results found for this or any previous visit.  No results found for this or any previous visit.  Results for orders placed during the hospital encounter of 07/05/22  Ultrasound  renal complete  Narrative CLINICAL DATA:  Follow-up left renal calculi.  EXAM: RENAL / URINARY TRACT ULTRASOUND COMPLETE  COMPARISON:  CT scan of the abdomen and pelvis May 28, 2022.  FINDINGS: Right Kidney:  Renal measurements: 13.7 x 4.9 x 5.4 cm = volume: 186 mL. Echogenicity within normal limits. No mass or hydronephrosis visualized.  Left Kidney:  Renal measurements: 30.3 x 4.9 x 6.2 cm = volume: 212 mL. Contains a 1.4 cm cyst. No follow-up imaging recommended for the cyst. Contains an 8 mm nonobstructive stone.  Bladder:  Appears normal for degree of bladder distention.  Other:  None.  IMPRESSION: 1. There is an 8 mm nonobstructive stone in the left kidney. 2. The kidneys are otherwise unremarkable. 3. The bladder is normal.   Electronically Signed By: Dorise Bullion III M.D. On: 07/05/2022 16:50  No valid procedures specified. No results found for this or any previous visit.  Results for orders placed during the hospital encounter of 05/28/22  CT RENAL STONE STUDY  Narrative CLINICAL DATA:  Left lower quadrant pain.  EXAM: CT ABDOMEN AND PELVIS WITHOUT CONTRAST  TECHNIQUE: Multidetector CT imaging  of the abdomen and pelvis was performed following the standard protocol without IV contrast.  RADIATION DOSE REDUCTION: This exam was performed according to the departmental dose-optimization program which includes automated exposure control, adjustment of the mA and/or kV according to patient size and/or use of iterative reconstruction technique.  COMPARISON:  April 29, 2018  FINDINGS: Lower chest: No acute abnormality.  Hepatobiliary: No focal liver abnormality is seen. No gallstones, gallbladder wall thickening, or biliary dilatation.  Pancreas: Unremarkable. No pancreatic ductal dilatation or surrounding inflammatory changes.  Spleen: Normal in size without focal abnormality.  Adrenals/Urinary Tract: Adrenal glands are unremarkable. Kidneys are normal in size, without focal lesions. A 3 mm nonobstructing renal calculus is seen within the posterior aspect of the mid to lower left kidney. An additional 3 mm obstructing renal calculus is seen at the left UVJ. Mild to moderate severity left-sided hydronephrosis and hydroureter are seen. The urinary bladder is poorly distended and subsequently limited in evaluation.  Stomach/Bowel: Stomach is within normal limits. The appendix is not clearly identified. No evidence of bowel wall thickening, distention, or inflammatory changes. Noninflamed diverticula are seen throughout the sigmoid colon.  Vascular/Lymphatic: No significant vascular findings are present. No enlarged abdominal or pelvic lymph nodes.  Reproductive: Status post hysterectomy. No adnexal masses.  Other: No abdominal wall hernia or abnormality. No abdominopelvic ascites.  Musculoskeletal: No acute or significant osseous findings.  IMPRESSION: 1. 3 mm obstructing renal calculus at the left UVJ. 2. 3 mm nonobstructing left renal calculus. 3. Sigmoid diverticulosis.   Electronically Signed By: Virgina Norfolk M.D. On: 05/28/2022 05:03  No results found  for this or any previous visit (from the past 24 hour(s)).

## 2022-07-10 LAB — URINALYSIS, ROUTINE W REFLEX MICROSCOPIC
Bilirubin, UA: NEGATIVE
Glucose, UA: NEGATIVE
Ketones, UA: NEGATIVE
Nitrite, UA: NEGATIVE
Specific Gravity, UA: 1.02 (ref 1.005–1.030)
Urobilinogen, Ur: 0.2 mg/dL (ref 0.2–1.0)
pH, UA: 5 (ref 5.0–7.5)

## 2022-07-10 LAB — MICROSCOPIC EXAMINATION

## 2022-07-12 LAB — URINE CULTURE

## 2022-07-13 ENCOUNTER — Telehealth: Payer: Self-pay

## 2022-07-13 MED ORDER — NITROFURANTOIN MONOHYD MACRO 100 MG PO CAPS: 100.0000 mg | ORAL_CAPSULE | Freq: Two times a day (BID) | ORAL | 0 refills | Status: DC

## 2022-07-13 NOTE — Telephone Encounter (Signed)
-----   Message from Cleon Gustin, MD sent at 07/13/2022  9:12 AM EDT ----- Macrobid '100mg'$  BID for 7 days ----- Message ----- From: Sherrilyn Rist, CMA Sent: 07/13/2022   9:08 AM EDT To: Cleon Gustin, MD; #  Please Review

## 2022-07-13 NOTE — Telephone Encounter (Signed)
Made patient aware that her urine culture was positive and Macrobid was sent to her pharmacy. Patient voiced understanding.

## 2022-07-22 ENCOUNTER — Other Ambulatory Visit: Payer: Self-pay | Admitting: Physician Assistant

## 2022-07-22 DIAGNOSIS — N201 Calculus of ureter: Secondary | ICD-10-CM

## 2022-08-17 ENCOUNTER — Encounter: Payer: Self-pay | Admitting: Family Medicine

## 2022-08-20 ENCOUNTER — Encounter: Payer: Self-pay | Admitting: *Deleted

## 2022-08-20 ENCOUNTER — Ambulatory Visit (INDEPENDENT_AMBULATORY_CARE_PROVIDER_SITE_OTHER): Payer: Medicare Other | Admitting: Physician Assistant

## 2022-08-20 ENCOUNTER — Encounter: Payer: Self-pay | Admitting: Physician Assistant

## 2022-08-20 VITALS — BP 112/78 | HR 75 | Wt 263.3 lb

## 2022-08-20 DIAGNOSIS — N3281 Overactive bladder: Secondary | ICD-10-CM

## 2022-08-20 DIAGNOSIS — R300 Dysuria: Secondary | ICD-10-CM | POA: Diagnosis not present

## 2022-08-20 DIAGNOSIS — Z170 Estrogen receptor positive status [ER+]: Secondary | ICD-10-CM | POA: Insufficient documentation

## 2022-08-20 DIAGNOSIS — C50112 Malignant neoplasm of central portion of left female breast: Secondary | ICD-10-CM | POA: Insufficient documentation

## 2022-08-20 DIAGNOSIS — N3000 Acute cystitis without hematuria: Secondary | ICD-10-CM

## 2022-08-20 LAB — URINALYSIS, ROUTINE W REFLEX MICROSCOPIC
Bilirubin, UA: NEGATIVE
Glucose, UA: NEGATIVE
Nitrite, UA: POSITIVE — AB
Protein,UA: NEGATIVE
Specific Gravity, UA: 1.02 (ref 1.005–1.030)
Urobilinogen, Ur: 0.2 mg/dL (ref 0.2–1.0)
pH, UA: 5 (ref 5.0–7.5)

## 2022-08-20 LAB — MICROSCOPIC EXAMINATION: RBC, Urine: NONE SEEN /hpf (ref 0–2)

## 2022-08-20 LAB — BLADDER SCAN AMB NON-IMAGING: Scan Result: 12

## 2022-08-20 MED ORDER — MIRABEGRON ER 25 MG PO TB24: 25.0000 mg | ORAL_TABLET | Freq: Every day | ORAL | 11 refills | Status: DC

## 2022-08-20 MED ORDER — AMOXICILLIN-POT CLAVULANATE 875-125 MG PO TABS: 1.0000 | ORAL_TABLET | Freq: Two times a day (BID) | ORAL | 0 refills | Status: DC

## 2022-08-20 NOTE — Progress Notes (Addendum)
Assessment: 1. OAB (overactive bladder) - Urinalysis, Routine w reflex microscopic - BLADDER SCAN AMB NON-IMAGING - mirabegron ER (MYRBETRIQ) 25 MG TB24 tablet; Take 1 tablet (25 mg total) by mouth daily.  Dispense: 30 tablet; Refill: 11  2. Acute cystitis without hematuria  3. Dysuria    Plan: Mdx cx ordered. Augmentin sent to pharm. Continue Myrbetriq.  Prescription given for 1 year.  Meds ordered this encounter  Medications   amoxicillin-clavulanate (AUGMENTIN) 875-125 MG tablet    Sig: Take 1 tablet by mouth every 12 (twelve) hours.    Dispense:  14 tablet    Refill:  0   mirabegron ER (MYRBETRIQ) 25 MG TB24 tablet    Sig: Take 1 tablet (25 mg total) by mouth daily.    Dispense:  30 tablet    Refill:  11     Chief Complaint: No chief complaint on file.   HPI: Kelli Estrada is a 61 y.o. female who presents for continued evaluation of mixed urinary incontinence and OAB.  Recent follow-up for stone disease.  She denies passing additional stone since her last visit.  Samples of more metric given at her last visit and she continues to complain of frequency, urgency, incontinence.  No gross hematuria.  No current fever, chills, nausea vomiting.  No abdominal pain. She completed Macrobid rx for recent UTI based on cx results.  Since the patient's last visit, she has had an abnormal breast study and has oncology evaluation later this week. Urine culture on 07/09/2022 grew 10-25 K colonies of mixed flora and 50K colonies of E. Coli.  Resistance noted to ampicillin and trimethoprim/sulfa.  UA= 11-30 WBCs, many bacteria, nitrite positive PVR=6m  07/09/22 Kelli Estrada a 61y.o. female who presents for continued evaluation of nephrolithiasis. She has passed UVJ stone noted in A08/23/24 Doing well without flank pain, gross hematuria, fever, chills. Pt also requests eval of LUTs ongoing for many years including MUI, frequency, urgency, and nocturia. She wears multiple pads daily and  voids 2-3 x at night. No h/o meds for this. She is post bladder suspension in the past. No burning, dysuria. GYN has told pt she has "bladder drop". A1c 5.5 in July UA= 6-10WBC and few bacteria PVR=054m  Renal ultrasound indicates 8 mm nonobstructive stone in the left kidney.  No hydroureteronephrosis noted.  Left-sided hydro has resolved.   05/30/22 RuRusty Glodowskis a [age].o. year old female with a history of urinary stone disease who is seen in consultation from FoSherald Hess MD  for evaluation of left-sided flank pain, nausea and vomiting and was found to have left sided UVJ and renal stone as seen in the ED on 8/11.  Her last stone event prior to this was in 2019.  Patient has not been straining her urine, but does not feel she is passed the stone.  She is taking Flomax as prescribed in the emergency department.  Today, she has complaints of urinary frequency, urgency, occasional incontinence.   Persistent flank pain. No fever or chills.  No gross hematuria.  Other medical history is significant for obstructive sleep apnea, has not worn CPAP in more than 3 years.  She is treated for diabetes mellitus and last A1c was 5.5 in July 23.  Patient is also on long-term oxycodone for chronic pain   UA=0-5 WBC, 3-10 RBC, no bact. KUB= opacity noted mid left abdomen consistent with distal ureteral stone.  Portions of the above documentation were copied from a  prior visit for review purposes only.  Allergies: No Known Allergies  PMH: Past Medical History:  Diagnosis Date   Anxiety    Chronic pain    Depression    H/O degenerative disc disease    PTSD (post-traumatic stress disorder)     PSH: Past Surgical History:  Procedure Laterality Date   ABDOMINAL HYSTERECTOMY     COLONOSCOPY WITH PROPOFOL N/A 03/15/2016   Procedure: COLONOSCOPY WITH PROPOFOL;  Surgeon: Teena Irani, MD;  Location: [facility];  Service: Endoscopy;  Laterality: N/A;   NECK SURGERY      SH: Social History    Tobacco Use   Smoking status: Never  Substance Use Topics   Alcohol use: Yes    Comment: occasionally    Drug use: No    ROS: All other review of systems were reviewed and are negative except what is noted above in HPI  PE: BP 112/78   Pulse 75   Wt 263 lb 4.8 oz (119.4 kg)   BMI 46.64 kg/m  GENERAL APPEARANCE:  Well appearing, well developed, well nourished, NAD HEENT:  Atraumatic, normocephalic NECK:  Supple. Trachea midline ABDOMEN:  Soft, non-tender, no masses EXTREMITIES:  Moves all extremities well, without clubbing, cyanosis, or edema NEUROLOGIC:  Alert and oriented x 3, normal gait, CN II-XII grossly intact MENTAL STATUS:  appropriate BACK:  Non-tender to palpation, No CVAT SKIN:  Warm, dry, and intact   Results: Laboratory Data: Lab Results  Component Value Date   WBC 8.7 05/28/2022   HGB 13.8 05/28/2022   HCT 41.8 05/28/2022   MCV 87.4 05/28/2022   PLT 164 05/28/2022    Lab Results  Component Value Date   CREATININE 0.94 05/28/2022    Urinalysis    Component Value Date/Time   COLORURINE YELLOW 05/28/2022 0419   APPEARANCEUR Cloudy (A) 07/09/2022 1440   LABSPEC 1.023 05/28/2022 0419   PHURINE 5.0 05/28/2022 0419   GLUCOSEU Negative 07/09/2022 1440   HGBUR MODERATE (A) 05/28/2022 0419   BILIRUBINUR Negative 07/09/2022 1440   KETONESUR NEGATIVE 05/28/2022 0419   PROTEINUR Trace (A) 07/09/2022 1440   PROTEINUR 30 (A) 05/28/2022 0419   NITRITE Negative 07/09/2022 1440   NITRITE NEGATIVE 05/28/2022 0419   LEUKOCYTESUR Trace (A) 07/09/2022 1440   LEUKOCYTESUR TRACE (A) 05/28/2022 0419    Lab Results  Component Value Date   LABMICR See below: 07/09/2022   WBCUA 6-10 (A) 07/09/2022   LABEPIT 0-10 07/09/2022   BACTERIA Few 07/09/2022    Pertinent Imaging: Results for orders placed during the hospital encounter of 05/30/22  DG Abd 1 View  Narrative CLINICAL DATA:  Left flank pain.  EXAM: ABDOMEN - 1 VIEW  COMPARISON:  None  Available.  FINDINGS: The bowel gas pattern is normal. A moderate to large stool burden is seen involving the ascending and distal transverse colon. A 5 mm soft tissue calcification is seen overlying the medial aspect of the mid left abdomen.  IMPRESSION: 1. Suspected 5 mm left ureteral calculus.   Electronically Signed By: Virgina Norfolk M.D. On: 05/31/2022 23:55  No results found for this or any previous visit.  No results found for this or any previous visit.  No results found for this or any previous visit.  Results for orders placed during the hospital encounter of 07/05/22  Ultrasound renal complete  Narrative CLINICAL DATA:  Follow-up left renal calculi.  EXAM: RENAL / URINARY TRACT ULTRASOUND COMPLETE  COMPARISON:  CT scan of the abdomen and pelvis May 28, 2022.  FINDINGS: Right Kidney:  Renal measurements: 13.7 x 4.9 x 5.4 cm = volume: 186 mL. Echogenicity within normal limits. No mass or hydronephrosis visualized.  Left Kidney:  Renal measurements: 30.3 x 4.9 x 6.2 cm = volume: 212 mL. Contains a 1.4 cm cyst. No follow-up imaging recommended for the cyst. Contains an 8 mm nonobstructive stone.  Bladder:  Appears normal for degree of bladder distention.  Other:  None.  IMPRESSION: 1. There is an 8 mm nonobstructive stone in the left kidney. 2. The kidneys are otherwise unremarkable. 3. The bladder is normal.   Electronically Signed By: Dorise Bullion III M.D. On: 07/05/2022 16:50  No valid procedures specified. No results found for this or any previous visit.  Results for orders placed during the hospital encounter of 05/28/22  CT RENAL STONE STUDY  Narrative CLINICAL DATA:  Left lower quadrant pain.  EXAM: CT ABDOMEN AND PELVIS WITHOUT CONTRAST  TECHNIQUE: Multidetector CT imaging of the abdomen and pelvis was performed following the standard protocol without IV contrast.  RADIATION DOSE REDUCTION: This exam was  performed according to the departmental dose-optimization program which includes automated exposure control, adjustment of the mA and/or kV according to patient size and/or use of iterative reconstruction technique.  COMPARISON:  April 29, 2018  FINDINGS: Lower chest: No acute abnormality.  Hepatobiliary: No focal liver abnormality is seen. No gallstones, gallbladder wall thickening, or biliary dilatation.  Pancreas: Unremarkable. No pancreatic ductal dilatation or surrounding inflammatory changes.  Spleen: Normal in size without focal abnormality.  Adrenals/Urinary Tract: Adrenal glands are unremarkable. Kidneys are normal in size, without focal lesions. A 3 mm nonobstructing renal calculus is seen within the posterior aspect of the mid to lower left kidney. An additional 3 mm obstructing renal calculus is seen at the left UVJ. Mild to moderate severity left-sided hydronephrosis and hydroureter are seen. The urinary bladder is poorly distended and subsequently limited in evaluation.  Stomach/Bowel: Stomach is within normal limits. The appendix is not clearly identified. No evidence of bowel wall thickening, distention, or inflammatory changes. Noninflamed diverticula are seen throughout the sigmoid colon.  Vascular/Lymphatic: No significant vascular findings are present. No enlarged abdominal or pelvic lymph nodes.  Reproductive: Status post hysterectomy. No adnexal masses.  Other: No abdominal wall hernia or abnormality. No abdominopelvic ascites.  Musculoskeletal: No acute or significant osseous findings.  IMPRESSION: 1. 3 mm obstructing renal calculus at the left UVJ. 2. 3 mm nonobstructing left renal calculus. 3. Sigmoid diverticulosis.   Electronically Signed By: Virgina Norfolk M.D. On: 05/28/2022 05:03  Results for orders placed or performed in visit on 08/20/22 (from the past 24 hour(s))  BLADDER SCAN AMB NON-IMAGING   Collection Time: 08/20/22 11:09 AM   Result Value Ref Range   Scan Result 12

## 2022-08-20 NOTE — Progress Notes (Signed)
post void residual =12  MDX Culture Tracking [phone]  Tracking # [id number]

## 2022-08-21 ENCOUNTER — Encounter: Payer: Self-pay | Admitting: *Deleted

## 2022-08-21 NOTE — Progress Notes (Signed)
Radiation Oncology         ([phone]) [phone] ________________________________  Multidisciplinary Breast Oncology Clinic [facility]) Initial Outpatient Consultation  Name: Kelli Estrada MRN: [id number]  Date: 08/22/2022  DOB: [birth year]  CC:Sherald Hess., MD  Jovita Kussmaul, MD   REFERRING PHYSICIAN: Autumn Messing III, MD  DIAGNOSIS: There were no encounter diagnoses.  Stage *** Central Left Breast, *** Carcinoma, ER*** / PR*** / Her2***, Grade ***  No diagnosis found.  HISTORY OF PRESENT ILLNESS::Kelli Estrada is a 61 y.o. female who is presenting to the office today for evaluation of her newly diagnosed breast cancer. She is accompanied by ***. She is doing well overall.   She had routine screening mammography on 07/18/22 showing a possible abnormality in the left breast. She underwent unilateal left breast diagnostic mammography with tomography [facility] on 08/08/22 showing: an indeterminate central left breast mass measuring 1.9 cm. Ultrasound of the left breast on that same date further revealed a 1.9 cm irregular mass in the central/retroareolar left breast highly suggestive of malignancy. No abnormal left axillary lymph nodes were appreciated.   Biopsy on *** showed: ***. Prognostic indicators significant for: estrogen receptor, ***% {positive or negative} and progesterone receptor, ***% {positive or negative}, both with {include note beside percentage here/strong staining intensity}. Proliferation marker Ki67 at ***%. HER2 {positive or negative}.  Menarche: *** years old Age at first live birth: *** years old GP: *** LMP: *** Contraceptive: *** HRT: ***   The patient was referred today for presentation in the multidisciplinary conference.  Radiology studies and pathology slides were presented there for review and discussion of treatment options.  A consensus was discussed regarding potential next steps.  PREVIOUS RADIATION THERAPY: {EXAM; YES/NO:19492::"No"}  PAST MEDICAL HISTORY:   Past Medical History:  Diagnosis Date   Anxiety    Chronic pain    Depression    H/O degenerative disc disease    PTSD (post-traumatic stress disorder)     PAST SURGICAL HISTORY: Past Surgical History:  Procedure Laterality Date   ABDOMINAL HYSTERECTOMY     COLONOSCOPY WITH PROPOFOL N/A 03/15/2016   Procedure: COLONOSCOPY WITH PROPOFOL;  Surgeon: Teena Irani, MD;  Location: [facility];  Service: Endoscopy;  Laterality: N/A;   NECK SURGERY      FAMILY HISTORY: No family history on file.  SOCIAL HISTORY:  Social History   Socioeconomic History   Marital status: Single    Spouse name: Not on file   Number of children: Not on file   Years of education: Not on file   Highest education level: Not on file  Occupational History   Not on file  Tobacco Use   Smoking status: Never   Smokeless tobacco: Not on file  Substance and Sexual Activity   Alcohol use: Yes    Comment: occasionally    Drug use: No   Sexual activity: Not on file  Other Topics Concern   Not on file  Social History Narrative   Not on file   Social Determinants of Health   Financial Resource Strain: Not on file  Food Insecurity: Not on file  Transportation Needs: Not on file  Physical Activity: Not on file  Stress: Not on file  Social Connections: Not on file    ALLERGIES: No Known Allergies  MEDICATIONS:  Current Outpatient Medications  Medication Sig Dispense Refill   albuterol (VENTOLIN HFA) 108 (90 Base) MCG/ACT inhaler SMARTSIG:1 Puff(s) Via Inhaler 4 Times Daily PRN     alendronate (FOSAMAX) 70 MG tablet  Take 70 mg by mouth once a week.     amoxicillin-clavulanate (AUGMENTIN) 875-125 MG tablet Take 1 tablet by mouth every 12 (twelve) hours. 14 tablet 0   Blood Glucose Monitoring Suppl (ONETOUCH VERIO REFLECT) w/Device KIT See admin instructions.     [employer] CALCIUM 600 & VITAMIN D3 600-800 MG-UNIT TABS Take 1 tablet by mouth daily.     [employer] VITAMIN E 180 MG (400 UNIT) CAPS Take 2 capsules by  mouth daily.     doxepin (SINEQUAN) 25 MG capsule Take 25 mg by mouth at bedtime.  0   fenofibrate (TRICOR) 48 MG tablet Take 40 mg by mouth daily.     Fluticasone-Umeclidin-Vilant (TRELEGY ELLIPTA) 100-62.5-25 MCG/ACT AEPB Inhale 100 mcg into the lungs 1 day or 1 dose.     gabapentin (NEURONTIN) 300 MG capsule Take 300 mg by mouth 3 (three) times daily.     hydrOXYzine (VISTARIL) 25 MG capsule Take 25 mg by mouth 2 (two) times daily as needed for anxiety.   0   metFORMIN (GLUCOPHAGE) 500 MG tablet SMARTSIG:1 Tablet(s) By Mouth Every Evening     mirabegron ER (MYRBETRIQ) 25 MG TB24 tablet Take 1 tablet (25 mg total) by mouth daily. 30 tablet 11   ondansetron (ZOFRAN ODT) 4 MG disintegrating tablet Take 1 tablet (4 mg total) by mouth every 8 (eight) hours as needed for nausea or vomiting. 8 tablet 0   Oxcarbazepine (TRILEPTAL) 300 MG tablet Take 300 mg by mouth 2 (two) times daily.     oxyCODONE-acetaminophen (PERCOCET/ROXICET) 5-325 MG tablet Take 1 tablet by mouth every 6 (six) hours as needed (For pain.).      OZEMPIC, 1 MG/DOSE, 4 MG/3ML SOPN Inject 1 mg into the skin once a week.     rosuvastatin (CRESTOR) 20 MG tablet Take 20 mg by mouth at bedtime.     sertraline (ZOLOFT) 100 MG tablet Take 100 mg by mouth 2 (two) times daily.  2   tamsulosin (FLOMAX) 0.4 MG CAPS capsule TAKE 1 CAPSULE BY MOUTH EVERY DAY 90 capsule 1   topiramate (TOPAMAX) 100 MG tablet Take 100 mg by mouth daily.     No current facility-administered medications for this encounter.    REVIEW OF SYSTEMS: A 10+ POINT REVIEW OF SYSTEMS WAS OBTAINED including neurology, dermatology, psychiatry, cardiac, respiratory, lymph, extremities, GI, GU, musculoskeletal, constitutional, reproductive, HEENT. On the provided form, she reports ***. She denies *** and any other symptoms.    PHYSICAL EXAM:  vitals were not taken for this visit.  {may need to copy over vitals} Lungs are clear to auscultation bilaterally. Heart has regular  rate and rhythm. No palpable cervical, supraclavicular, or axillary adenopathy. Abdomen soft, non-tender, normal bowel sounds. Breast: *** breast with no palpable mass, nipple discharge, or bleeding. *** breast with ***.   KPS = ***  100 - Normal; no complaints; no evidence of disease. 90   - Able to carry on normal activity; minor signs or symptoms of disease. 80   - Normal activity with effort; some signs or symptoms of disease. 43   - Cares for self; unable to carry on normal activity or to do active work. 60   - Requires occasional assistance, but is able to care for most of his personal needs. 50   - Requires considerable assistance and frequent medical care. 70   - Disabled; requires special care and assistance. 28   - Severely disabled; hospital admission is indicated although death not imminent. 20   -  Very sick; hospital admission necessary; active supportive treatment necessary. 10   - Moribund; fatal processes progressing rapidly. 0     - Dead  Karnofsky DA, Abelmann [place], Craver LS and Burchenal [facility] [phone]) The use of the nitrogen mustards in the palliative treatment of carcinoma: with particular reference to bronchogenic carcinoma Cancer 1 634-56  LABORATORY DATA:  Lab Results  Component Value Date   WBC 8.7 05/28/2022   HGB 13.8 05/28/2022   HCT 41.8 05/28/2022   MCV 87.4 05/28/2022   PLT 164 05/28/2022   Lab Results  Component Value Date   NA 138 05/28/2022   K 3.5 05/28/2022   CL 108 05/28/2022   CO2 21 (L) 05/28/2022   Lab Results  Component Value Date   ALT 25 04/28/2018   AST 20 04/28/2018   ALKPHOS 64 04/28/2018   BILITOT 1.1 04/28/2018    PULMONARY FUNCTION TEST:   Review Flowsheet        No data to display          RADIOGRAPHY: No results found.    IMPRESSION: ***   Patient will be a good candidate for breast conservation with radiotherapy to the left breast. We discussed the general course of radiation, potential side effects, and toxicities  with radiation and the patient is interested in this approach. ***   PLAN:  ***   ------------------------------------------------  Blair Promise, PhD, MD  This document serves as a record of services personally performed by Gery Pray, MD. It was created on his behalf by Roney Mans, a trained medical scribe. The creation of this record is based on the scribe's personal observations and the provider's statements to them. This document has been checked and approved by the attending provider.

## 2022-08-22 ENCOUNTER — Inpatient Hospital Stay: Payer: Medicare Other | Attending: Hematology and Oncology

## 2022-08-22 ENCOUNTER — Ambulatory Visit: Payer: Medicare Other | Attending: General Surgery | Admitting: Physical Therapy

## 2022-08-22 ENCOUNTER — Ambulatory Visit: Payer: Self-pay | Admitting: General Surgery

## 2022-08-22 ENCOUNTER — Ambulatory Visit
Admission: RE | Admit: 2022-08-22 | Discharge: 2022-08-22 | Disposition: A | Discharge status: Home / Self Care | Payer: Medicare Other | Source: Ambulatory Visit | Attending: Radiation Oncology | Admitting: Radiation Oncology

## 2022-08-22 ENCOUNTER — Encounter: Payer: Self-pay | Admitting: Physical Therapy

## 2022-08-22 ENCOUNTER — Encounter: Payer: Self-pay | Admitting: *Deleted

## 2022-08-22 ENCOUNTER — Inpatient Hospital Stay (HOSPITAL_BASED_OUTPATIENT_CLINIC_OR_DEPARTMENT_OTHER): Payer: Medicare Other | Admitting: Hematology and Oncology

## 2022-08-22 ENCOUNTER — Inpatient Hospital Stay (HOSPITAL_BASED_OUTPATIENT_CLINIC_OR_DEPARTMENT_OTHER): Payer: Medicare Other | Admitting: Genetic Counselor

## 2022-08-22 ENCOUNTER — Other Ambulatory Visit: Payer: Self-pay

## 2022-08-22 VITALS — BP 123/87 | HR 69 | Temp 98.4°F | Resp 18 | Ht 63.0 in | Wt 258.0 lb

## 2022-08-22 DIAGNOSIS — Z801 Family history of malignant neoplasm of trachea, bronchus and lung: Secondary | ICD-10-CM | POA: Insufficient documentation

## 2022-08-22 DIAGNOSIS — C50112 Malignant neoplasm of central portion of left female breast: Secondary | ICD-10-CM

## 2022-08-22 DIAGNOSIS — Z803 Family history of malignant neoplasm of breast: Secondary | ICD-10-CM | POA: Insufficient documentation

## 2022-08-22 DIAGNOSIS — Z170 Estrogen receptor positive status [ER+]: Secondary | ICD-10-CM | POA: Insufficient documentation

## 2022-08-22 DIAGNOSIS — Z90710 Acquired absence of both cervix and uterus: Secondary | ICD-10-CM | POA: Diagnosis not present

## 2022-08-22 DIAGNOSIS — Z808 Family history of malignant neoplasm of other organs or systems: Secondary | ICD-10-CM

## 2022-08-22 DIAGNOSIS — R293 Abnormal posture: Secondary | ICD-10-CM | POA: Insufficient documentation

## 2022-08-22 LAB — CBC WITH DIFFERENTIAL (CANCER CENTER ONLY)
Abs Immature Granulocytes: 0.01 10*3/uL (ref 0.00–0.07)
Basophils Absolute: 0 10*3/uL (ref 0.0–0.1)
Basophils Relative: 1 %
Eosinophils Absolute: 0.1 10*3/uL (ref 0.0–0.5)
Eosinophils Relative: 2 %
HCT: 40.1 % (ref 36.0–46.0)
Hemoglobin: 13.5 g/dL (ref 12.0–15.0)
Immature Granulocytes: 0 %
Lymphocytes Relative: 31 %
Lymphs Abs: 1.5 10*3/uL (ref 0.7–4.0)
MCH: 29.5 pg (ref 26.0–34.0)
MCHC: 33.7 g/dL (ref 30.0–36.0)
MCV: 87.6 fL (ref 80.0–100.0)
Monocytes Absolute: 0.3 10*3/uL (ref 0.1–1.0)
Monocytes Relative: 6 %
Neutro Abs: 2.8 10*3/uL (ref 1.7–7.7)
Neutrophils Relative %: 60 %
Platelet Count: 157 10*3/uL (ref 150–400)
RBC: 4.58 MIL/uL (ref 3.87–5.11)
RDW: 14.4 % (ref 11.5–15.5)
WBC Count: 4.7 10*3/uL (ref 4.0–10.5)
nRBC: 0 % (ref 0.0–0.2)

## 2022-08-22 LAB — CMP (CANCER CENTER ONLY)
ALT: 12 U/L (ref 0–44)
AST: 12 U/L — ABNORMAL LOW (ref 15–41)
Albumin: 4.1 g/dL (ref 3.5–5.0)
Alkaline Phosphatase: 58 U/L (ref 38–126)
Anion gap: 7 (ref 5–15)
BUN: 12 mg/dL (ref 6–20)
CO2: 25 mmol/L (ref 22–32)
Calcium: 9 mg/dL (ref 8.9–10.3)
Chloride: 109 mmol/L (ref 98–111)
Creatinine: 0.71 mg/dL (ref 0.44–1.00)
GFR, Estimated: >60 mL/min (ref >60)
Glucose, Bld: 115 mg/dL — ABNORMAL HIGH (ref 70–99)
Potassium: 3.9 mmol/L (ref 3.5–5.1)
Sodium: 141 mmol/L (ref 135–145)
Total Bilirubin: 0.5 mg/dL (ref 0.3–1.2)
Total Protein: 6.8 g/dL (ref 6.5–8.1)

## 2022-08-22 LAB — GENETIC SCREENING ORDER

## 2022-08-22 NOTE — Progress Notes (Signed)
[place] NOTE  Patient Care Team: Sherald Hess., MD as PCP - General (Family Medicine) Benay Pike, MD as Consulting Physician (Hematology and Oncology) Jovita Kussmaul, MD as Consulting Physician (General Surgery) Gery Pray, MD as Consulting Physician (Radiation Oncology) Mauro Kaufmann, RN as Oncology Nurse Navigator Rockwell Germany, RN as Oncology Nurse Navigator  CHIEF COMPLAINTS/PURPOSE OF CONSULTATION:  Newly diagnosed breast cancer  HISTORY OF PRESENTING ILLNESS:  Kelli Estrada 61 y.o. female is here because of recent diagnosis of left breast cancer  I reviewed her records extensively and collaborated the history with the patient.  SUMMARY OF ONCOLOGIC HISTORY: Oncology History  Malignant neoplasm of central portion of left breast in female, estrogen receptor positive ([place])  07/18/2022 Imaging   Mammogram showed indeterminate left breast mass central to the nipple in the retroareolar region, indeterminate lymph node.  Ultrasound done showed 1.9 x 1.4 x 1.5 cm taller than wide irregular mass in the left breast highly suggestive of malignancy.  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 Kelli Estrada is here for an initial visit with her daughter.  She tells me that she did not feel this mass.  When she went for her regular visit, she had a breast exam and then noticed something hard in her left breast and hence recommended further evaluation.  She had a mammogram last year according to her which was unremarkable.  At baseline she has chronic pain issues and takes pain medication.  Her  pain is secondary to degenerative disc disease.  She also has some underlying anxiety and depression.  Rest of the pertinent 10 point ROS reviewed and negative  MEDICAL HISTORY:  Past Medical History:  Diagnosis Date   Anxiety    Chronic pain    Depression    H/O degenerative disc disease    PTSD (post-traumatic stress disorder)     SURGICAL HISTORY: Past Surgical History:  Procedure Laterality Date   ABDOMINAL HYSTERECTOMY     COLONOSCOPY WITH PROPOFOL N/A 03/15/2016   Procedure: COLONOSCOPY WITH PROPOFOL;  Surgeon: Teena Irani, MD;  Location: [facility];  Service: Endoscopy;  Laterality: N/A;   NECK SURGERY      SOCIAL HISTORY: Social History   Socioeconomic History   Marital status: Single    Spouse name: Not on file   Number of children: Not on file   Years of education: Not on file   Highest education level: Not on file  Occupational History   Not on file  Tobacco Use   Smoking status: Never   Smokeless tobacco: Not on file  Substance and Sexual Activity   Alcohol use: Yes    Comment: occasionally    Drug use: No   Sexual activity: Not on file  Other Topics Concern   Not on file  Social History Narrative   Not on file   Social Determinants of Health   Financial Resource Strain: Not on file  Food Insecurity: Not on file  Transportation Needs: Not on file  Physical Activity: Not on file  Stress: Not on file  Social Connections: Not on file  Intimate Partner Violence: Not on file    FAMILY HISTORY: Family History  Problem Relation Age of Onset   Lung cancer Mother    Breast cancer Maternal Grandmother     ALLERGIES:  has No Known Allergies.  MEDICATIONS:  Current Outpatient Medications  Medication Sig Dispense Refill   omeprazole (PRILOSEC OTC) 20 MG tablet Take 20 mg by mouth daily.     albuterol (VENTOLIN HFA) 108 (90 Base) MCG/ACT inhaler SMARTSIG:1 Puff(s) Via Inhaler 4 Times Daily PRN     alendronate (FOSAMAX) 70 MG tablet Take 70 mg by  mouth once a week.     amoxicillin-clavulanate (AUGMENTIN) 875-125 MG tablet Take 1 tablet by mouth every 12 (twelve) hours. 14 tablet 0   Blood Glucose Monitoring Suppl (ONETOUCH VERIO REFLECT) w/Device KIT See admin instructions.     [employer] CALCIUM 600 & VITAMIN D3 600-800 MG-UNIT TABS Take 1 tablet by mouth daily.     [employer] VITAMIN E 180 MG (400 UNIT) CAPS Take 2 capsules by mouth daily.     fenofibrate (TRICOR) 48 MG tablet Take 40 mg by mouth daily.     Fluticasone-Umeclidin-Vilant (TRELEGY ELLIPTA) 100-62.5-25 MCG/ACT AEPB Inhale 100 mcg into the lungs 1 day or 1 dose.     gabapentin (NEURONTIN) 300 MG capsule Take 300 mg by mouth 3 (three) times daily.     hydrOXYzine (VISTARIL) 25 MG capsule Take 25 mg by mouth 2 (two) times daily as needed for anxiety.   0   metFORMIN (GLUCOPHAGE) 500 MG tablet SMARTSIG:1 Tablet(s) By Mouth Every Evening     mirabegron ER (MYRBETRIQ) 25 MG TB24 tablet Take 1 tablet (25 mg total) by mouth daily. 30 tablet 11   ondansetron (ZOFRAN ODT) 4 MG disintegrating tablet Take 1 tablet (4 mg total) by mouth every 8 (eight) hours as needed for nausea or vomiting. 8 tablet 0   Oxcarbazepine (TRILEPTAL) 300 MG tablet Take 300 mg by mouth 2 (two) times daily.     oxyCODONE-acetaminophen (PERCOCET/ROXICET) 5-325 MG tablet Take 1 tablet by mouth every 6 (six) hours as needed (For pain.).      OZEMPIC, 1 MG/DOSE, 4 MG/3ML SOPN Inject 1 mg into the skin once a week.     rosuvastatin (CRESTOR) 20 MG tablet Take 20 mg by mouth at bedtime.     sertraline (ZOLOFT) 100 MG tablet Take 100 mg by mouth 2 (two) times daily.  2   tamsulosin (FLOMAX) 0.4 MG CAPS capsule TAKE 1 CAPSULE BY MOUTH EVERY DAY 90 capsule 1   topiramate (TOPAMAX) 100 MG tablet Take 100 mg by mouth daily.     No current facility-administered medications for this visit.    REVIEW OF SYSTEMS:   Constitutional: Denies fevers, chills or abnormal night sweats Eyes: Denies blurriness of vision, double vision or  watery eyes Ears, nose, mouth, throat, and face: Denies mucositis or sore throat Respiratory: Denies cough, dyspnea or wheezes Cardiovascular: Denies palpitation, chest discomfort or lower extremity swelling Gastrointestinal:  Denies nausea, heartburn or change in bowel habits Skin: Denies abnormal skin rashes Lymphatics: Denies new lymphadenopathy or easy bruising Neurological:Denies numbness, tingling or new weaknesses Behavioral/Psych: Mood is stable, no new changes  Breast: Denies any palpable lumps or discharge All other systems were reviewed with the patient and are negative.  PHYSICAL EXAMINATION: ECOG PERFORMANCE STATUS: 0 - Asymptomatic  Vitals:   08/22/22 1248  BP: 123/87  Pulse: 69  Resp: 18  Temp: 98.4 F (36.9 C)  SpO2: 96%   Filed  Weights   08/22/22 1248  Weight: 258 lb (117 kg)    GENERAL:alert, no distress and comfortable SKIN: skin color, texture, turgor are normal, no rashes or significant lesions EYES: normal, conjunctiva are pink and non-injected, sclera clear OROPHARYNX:no exudate, no erythema and lips, buccal mucosa, and tongue normal  NECK: supple, thyroid normal size, non-tender, without nodularity LYMPH:  no palpable lymphadenopathy in the cervical, axillary or inguinal LUNGS: clear to auscultation and percussion with normal breathing effort HEART: regular rate & rhythm and no murmurs and no lower extremity edema ABDOMEN:abdomen soft, non-tender and normal bowel sounds Musculoskeletal:no cyanosis of digits and no clubbing  PSYCH: alert & oriented x 3 with fluent speech NEURO: no focal motor/sensory deficits BREAST: Bilateral breasts inspected.  Small palpable mass inferior to the nipple at 5 o'clock position measuring about a centimeter to centimeter and a half noted.  She has chronically inverted nipples.  No change.  No regional adenopathy.  LABORATORY DATA:  I have reviewed the data as listed Lab Results  Component Value Date   WBC 4.7  08/22/2022   HGB 13.5 08/22/2022   HCT 40.1 08/22/2022   MCV 87.6 08/22/2022   PLT 157 08/22/2022   Lab Results  Component Value Date   NA 141 08/22/2022   K 3.9 08/22/2022   CL 109 08/22/2022   CO2 25 08/22/2022    RADIOGRAPHIC STUDIES: I have personally reviewed the radiological reports and agreed with the findings in the report.  ASSESSMENT AND PLAN:  Malignant neoplasm of central portion of left breast in female, estrogen receptor positive ([place]) This is a very pleasant 61 year old female patient with newly diagnosed left breast invasive ductal carcinoma T1N0 grade 2 ER 100% positive strong staining PR 90% positive strong staining Ki-67 of 10% and HER2 1+ by IHC referred to breast [place] for additional recommendations.   Given early stage, strong ER/PR positivity and no evidence of lymph node involvement we have discussed about upfront surgery followed by Oncotype testing.  We have discussed about Oncotype Dx score which is a well validated prognostic scoring system which can predict outcome with endocrine therapy alone and whether chemotherapy reduces recurrence.  Typically in patients with ER positive cancers that are node negative if the RS score is high typically greater than or equal to 26, chemotherapy is recommended.  In women with intermediate recurrence score younger than 18, there can still be some role for chemotherapy in addition to endocrine therapy especially if the recurrence score is between 21-25. If chemotherapy is needed, this will precede radiation and then after radiation she will continue on antiestrogen therapy.  If she is not deemed to be an adjuvant chemotherapy candidate, we will proceed with radiation followed by antiestrogen therapy.  We have discussed about antiestrogen therapy today as well. We have discussed options for antiestrogen therapy today  With regards to Tamoxifen, we discussed that this is a SERM, selective estrogen receptor modulator. We discussed  mechanism of action of Tamoxifen, adverse effects on Tamoxifen including but not limited to post menopausal symptoms, increased risk of DVT/PE, increased risk of endometrial cancer, questionable cataracts with long term use and increased risk of cardiovascular events in the study which was not statistically significant. A benefit from Tamoxifen would be improvement in bone density. With regards to aromatase inhibitors, we discussed mechanism of action, adverse effects including but not limited to post menopausal symptoms, arthralgias, myalgias, increased risk of cardiovascular events and bone loss.   At this time she is leaning towards  anastrozole or aromatase inhibitors.  She will return to clinic after surgery to review final pathology, Oncotype results and discuss additional recommendations.  Total time spent: 60 minutes including history, physical exam, review of records, counseling and coordination of care All questions were answered. The patient knows to call the clinic with any problems, questions or concerns.    Benay Pike, MD 08/23/22

## 2022-08-22 NOTE — Therapy (Addendum)
OUTPATIENT PHYSICAL THERAPY BREAST CANCER BASELINE EVALUATION   Patient Name: Kelli Estrada MRN: [id number] DOB:[birth year], 61 y.o., female Today's Date: 08/22/2022   PT End of Session - 08/22/22 1412     Visit Number 1    Number of Visits 2    Date for PT Re-Evaluation 10/17/22    PT Start Time 7829    PT Stop Time 1448    PT Time Calculation (min) 28 min    Activity Tolerance Patient tolerated treatment well    Behavior During Therapy WFL for tasks assessed/performed             Past Medical History:  Diagnosis Date   Anxiety    Chronic pain    Depression    H/O degenerative disc disease    PTSD (post-traumatic stress disorder)    Past Surgical History:  Procedure Laterality Date   ABDOMINAL HYSTERECTOMY     COLONOSCOPY WITH PROPOFOL N/A 03/15/2016   Procedure: COLONOSCOPY WITH PROPOFOL;  Surgeon: Teena Irani, MD;  Location: [facility];  Service: Endoscopy;  Laterality: N/A;   NECK SURGERY     Patient Active Problem List   Diagnosis Date Noted   Malignant neoplasm of central portion of left breast in female, estrogen receptor positive ([place]) 08/20/2022    REFERRING PROVIDER: Dr. Autumn Messing  REFERRING DIAG: Left breast cancer  THERAPY DIAG:  Carcinoma of central portion of left breast in female, estrogen receptor positive ([place])  Abnormal posture  Rationale for Evaluation and Treatment: Rehabilitation  ONSET DATE: 07/18/2022  SUBJECTIVE:                                                                                                                                                                                           SUBJECTIVE STATEMENT: Patient reports she is here today to be seen by her medical team for her newly diagnosed left breast cancer.   PERTINENT HISTORY:  Patient was diagnosed on 07/18/2022 with left grade 2 invasive ductal carcinoma breast cancer. It measures 1.9 cm and is located in the central quadrant. It is ER/PR positive and HER2 negative  with a Ki67 of 10%.   PATIENT GOALS:   reduce lymphedema risk and learn post op HEP.   PAIN:  Are you having pain? Yes: NPRS scale: 8/10 Pain location: Neck and back Pain description: achy and stiff Aggravating factors: sitting Relieving factors: medication  PRECAUTIONS: Active CA Fall  HAND DOMINANCE: right  WEIGHT BEARING RESTRICTIONS: No  FALLS:  Has patient fallen in last 6 months? No  LIVING ENVIRONMENT: Patient lives with: her daughter Lives in: House/apartment Has following equipment at home: Single point  cane  OCCUPATION: On disability  LEISURE: Walks some in her yard  PRIOR LEVEL OF FUNCTION: Independent   OBJECTIVE:  COGNITION: Overall cognitive status: Within functional limits for tasks assessed    POSTURE:  Forward head and rounded shoulders posture  UPPER EXTREMITY AROM/PROM:  A/PROM RIGHT   eval   Shoulder extension 40  Shoulder flexion 146  Shoulder abduction 149  Shoulder internal rotation 67  Shoulder external rotation 77    (Blank rows = not tested)  A/PROM LEFT   eval  Shoulder extension 32  Shoulder flexion 139  Shoulder abduction 152  Shoulder internal rotation 60  Shoulder external rotation 75    (Blank rows = not tested)  CERVICAL AROM: All within normal limits:    Percent limited  Flexion WNL  Extension WNL  Right lateral flexion WNL  Left lateral flexion WNL  Right rotation 25% limited  Left rotation 25% limited    UPPER EXTREMITY STRENGTH: WFL  LYMPHEDEMA ASSESSMENTS:   LANDMARK RIGHT   eval  10 cm proximal to olecranon process 40  Olecranon process 27.5  10 cm proximal to ulnar styloid process 25.2  Just proximal to ulnar styloid process 15.8  Across hand at thumb web space 17.9  At base of 2nd digit 5.9  (Blank rows = not tested)  LANDMARK LEFT   eval  10 cm proximal to olecranon process 40.2  Olecranon process 26.5  10 cm proximal to ulnar styloid process 25.1  Just proximal to ulnar styloid  process 16.3  Across hand at thumb web space 18.3  At base of 2nd digit 6.5  (Blank rows = not tested)  L-DEX LYMPHEDEMA SCREENING:  The patient was assessed using the L-Dex machine today to produce a lymphedema index baseline score. The patient will be reassessed on a regular basis (typically every 3 months) to obtain new L-Dex scores. If the score is > 6.5 points away from his/her baseline score indicating onset of subclinical lymphedema, it will be recommended to wear a compression garment for 4 weeks, 12 hours per day and then be reassessed. If the score continues to be > 6.5 points from baseline at reassessment, we will initiate lymphedema treatment. Assessing in this manner has a 95% rate of preventing clinically significant lymphedema.   L-DEX FLOWSHEETS - 08/22/22 1400       L-DEX LYMPHEDEMA SCREENING   Measurement Type Unilateral    L-DEX MEASUREMENT EXTREMITY Upper Extremity    POSITION  Standing    DOMINANT SIDE Right    At Risk Side Left    BASELINE SCORE (UNILATERAL) 12.5              PATIENT EDUCATION:  Education details: Lymphedema risk reduction and post op shoulder/posture HEP Person educated: Patient Education method: Explanation, Demonstration, Handout Education comprehension: Patient verbalized understanding and returned demonstration  HOME EXERCISE PROGRAM: Patient was instructed today in a home exercise program today for post op shoulder range of motion. These included active assist shoulder flexion in sitting, scapular retraction, wall walking with shoulder abduction, and hands behind head external rotation.  She was encouraged to do these twice a day, holding 3 seconds and repeating 5 times when permitted by her physician.   ASSESSMENT:  CLINICAL IMPRESSION: Patient was diagnosed on 07/18/2022 with left grade 2 invasive ductal carcinoma breast cancer. It measures 1.9 cm and is located in the central quadrant. It is ER/PR positive and HER2 negative with a  Ki67 of 10%. Her multidisciplinary medical team met prior  to her assessments to determine a recommended treatment plan. She is planning to have a left lumpectomy and sentinel node biopsy followed by Oncotype testing, radiation, and anti-estrogen therapy. She will benefit from a post op PT reassessment to determine needs and from L-Dex screens every 3 months for 2 years to detect subclinical lymphedema.  Pt will benefit from skilled therapeutic intervention to improve on the following deficits: Decreased knowledge of precautions, impaired UE functional use, pain, decreased ROM, postural dysfunction.   PT treatment/interventions: ADL/self-care home management, pt/family education, therapeutic exercise  REHAB POTENTIAL: Excellent  CLINICAL DECISION MAKING: Stable/uncomplicated  EVALUATION COMPLEXITY: Low   GOALS: Goals reviewed with patient? YES  LONG TERM GOALS: (STG=LTG)    Name Target Date Goal status  1 Pt will be able to verbalize understanding of pertinent lymphedema risk reduction practices relevant to her dx specifically related to skin care.  Baseline:  No knowledge 08/22/2022 Achieved at eval  2 Pt will be able to return demo and/or verbalize understanding of the post op HEP related to regaining shoulder ROM. Baseline:  No knowledge 08/22/2022 Achieved at eval  3 Pt will be able to verbalize understanding of the importance of attending the post op After Breast CA Class for further lymphedema risk reduction education and therapeutic exercise.  Baseline:  No knowledge 08/22/2022 Achieved at eval  4 Pt will demo she has regained full shoulder ROM and function post operatively compared to baselines.  Baseline: See objective measurements taken today. 10/17/2022     PLAN:  PT FREQUENCY/DURATION: EVAL and 1 follow up appointment.   PLAN FOR NEXT SESSION: will reassess 3-4 weeks post op to determine needs.   Patient will follow up at outpatient cancer rehab 3-4 weeks following surgery.   If the patient requires physical therapy at that time, a specific plan will be dictated and sent to the referring physician for approval. The patient was educated today on appropriate basic range of motion exercises to begin post operatively and the importance of attending the After Breast Cancer class following surgery.  Patient was educated today on lymphedema risk reduction practices as it pertains to recommendations that will benefit the patient immediately following surgery.  She verbalized good understanding.    Physical Therapy Information for After Breast Cancer Surgery/Treatment:  Lymphedema is a swelling condition that you may be at risk for in your arm if you have lymph nodes removed from the armpit area.  After a sentinel node biopsy, the risk is approximately 5-9% and is higher after an axillary node dissection.  There is treatment available for this condition and it is not life-threatening.  Contact your physician or physical therapist with concerns. You may begin the 4 shoulder/posture exercises (see additional sheet) when permitted by your physician (typically a week after surgery).  If you have drains, you may need to wait until those are removed before beginning range of motion exercises.  A general recommendation is to not lift your arms above shoulder height until drains are removed.  These exercises should be done to your tolerance and gently.  This is not a "no pain/no gain" type of recovery so listen to your body and stretch into the range of motion that you can tolerate, stopping if you have pain.  If you are having immediate reconstruction, ask your plastic surgeon about doing exercises as he or she may want you to wait. We encourage you to attend the free one time ABC (After Breast Cancer) class offered by [facility].  Bonita Quin will learn information related to lymphedema risk, prevention and treatment and additional exercises to regain mobility following surgery.   You can call [phone] for more information.  This is offered the 1st and 3rd Monday of each month.  You only attend the class one time. While undergoing any medical procedure or treatment, try to avoid blood pressure being taken or needle sticks from occurring on the arm on the side of cancer.   This recommendation begins after surgery and continues for the rest of your life.  This may help reduce your risk of getting lymphedema (swelling in your arm). An excellent resource for those seeking information on lymphedema is the National Lymphedema Network's web site. It can be accessed at www.lymphnet.org If you notice swelling in your hand, arm or breast at any time following surgery (even if it is many years from now), please contact your doctor or physical therapist to discuss this.  Lymphedema can be treated at any time but it is easier for you if it is treated early on.  If you feel like your shoulder motion is not returning to normal in a reasonable amount of time, please contact your surgeon or physical therapist.  [facility] [phone]. [address], [place] [address]  ABC CLASS After Breast Cancer Class  After Breast Cancer Class is a specially designed exercise class to assist you in a safe recover after having breast cancer surgery.  In this class you will learn how to get back to full function whether your drains were just removed or if you had surgery a month ago.  This one-time class is held the 1st and 3rd Monday of every month from 11:00 a.m. until 12:00 noon virtually.  This class is FREE and space is limited. For more information or to register for the next available class, call [phone].  Class Goals  Understand specific stretches to improve the flexibility of you chest and shoulder. Learn ways to safely strengthen your upper body and improve your posture. Understand the warning signs of infection and why you may be at  risk for an arm infection. Learn about Lymphedema and prevention.  ** You do not attend this class until after surgery.  Drains must be removed to participate  Patient was instructed today in a home exercise program today for post op shoulder range of motion. These included active assist shoulder flexion in sitting, scapular retraction, wall walking with shoulder abduction, and hands behind head external rotation.  She was encouraged to do these twice a day, holding 3 seconds and repeating 5 times when permitted by her physician.  Bethann Punches, [place] 08/22/22 2:52 PM  PHYSICAL THERAPY DISCHARGE SUMMARY  Visits from Start of Care: 1  Current functional level related to goals / functional outcomes: See above   Remaining deficits: See above   Education / Equipment: See above   Patient agrees to discharge. Patient goals were not met. Patient is being discharged due to not returning since the last visit.  Milagros Loll [place], [place] 02/20/23 12:56 PM

## 2022-08-23 ENCOUNTER — Telehealth: Payer: Self-pay | Admitting: *Deleted

## 2022-08-23 ENCOUNTER — Telehealth: Payer: Self-pay

## 2022-08-23 ENCOUNTER — Encounter: Payer: Self-pay | Admitting: Genetic Counselor

## 2022-08-23 ENCOUNTER — Encounter: Payer: Self-pay | Admitting: Hematology and Oncology

## 2022-08-23 DIAGNOSIS — Z803 Family history of malignant neoplasm of breast: Secondary | ICD-10-CM | POA: Insufficient documentation

## 2022-08-23 HISTORY — DX: Family history of malignant neoplasm of breast: Z80.3

## 2022-08-23 NOTE — Progress Notes (Signed)
REFERRING PROVIDER: Benay Pike, MD [address] [place],  [place] [address]  PRIMARY PROVIDER:  Sherald Hess., MD  PRIMARY REASON FOR VISIT:  1. Malignant neoplasm of central portion of left breast in female, estrogen receptor positive ([place])   2. Family history of breast cancer     HISTORY OF PRESENT ILLNESS:   Kelli Estrada, a 61 y.o. female, was seen for a [facility] cancer genetics consultation at the request of Dr. Chryl Heck due to a personal and family history of breast cancer.  Kelli Estrada presents to clinic today to discuss the possibility of a hereditary predisposition to cancer, to discuss genetic testing, and to further clarify her future cancer risks, as well as potential cancer risks for family members.   In 2023, at the age of 46, Kelli Estrada was diagnosed with invasive ductal carcinoma of the left breast (ER+/PR+/HER2-). The treatment plan is pending.    CANCER HISTORY:  Oncology History  Malignant neoplasm of central portion of left breast in female, estrogen receptor positive ([place])  07/18/2022 Imaging   Mammogram showed indeterminate left breast mass central to the nipple in the retroareolar region, indeterminate lymph node.  Ultrasound done showed 1.9 x 1.4 x 1.5 cm taller than wide irregular mass in the left breast highly suggestive of malignancy.  No significant abnormality seen in the left axilla   08/16/2022 Pathology Results   Pathology from the left breast mass showed grade 2 invasive ductal carcinoma ER 100% positive strong staining PR 90% positive strong staining Ki-67 of 10% and HER2 1+ by [facility]   08/20/2022 Initial Diagnosis   Malignant neoplasm of central portion of left breast in female, estrogen receptor positive ([place])       Past Medical History:  Diagnosis Date   Anxiety    Chronic pain    Depression    Family history of breast cancer 08/23/2022   H/O degenerative disc disease    PTSD (post-traumatic stress disorder)     Past Surgical History:   Procedure Laterality Date   ABDOMINAL HYSTERECTOMY     COLONOSCOPY WITH PROPOFOL N/A 03/15/2016   Procedure: COLONOSCOPY WITH PROPOFOL;  Surgeon: Teena Irani, MD;  Location: [facility];  Service: Endoscopy;  Laterality: N/A;   NECK SURGERY      FAMILY HISTORY:  We obtained a detailed, 4-generation family history.  Significant diagnoses are listed below: Family History  Problem Relation Age of Onset   Lung cancer Mother        dx > 48   Breast cancer Maternal Grandmother 16    Kelli Estrada is unaware of previous family history of genetic testing for hereditary cancer risks. There is no reported Ashkenazi Jewish ancestry. There is no known consanguinity.  GENETIC COUNSELING ASSESSMENT: Kelli Estrada is a 61 y.o. female with a personal and family history which is somewhat suggestive of a hereditary cancer syndrome and predisposition to cancer given the presence of breast cancer in multiple generations of the family. We, therefore, discussed and recommended the following at today's visit.   DISCUSSION: We discussed that 5 - 10% of cancer is hereditary.  Most cases of hereditary breast cancer are associated with mutations in BRCA1/2.  There are other genes that can be associated with hereditary breast cancer syndromes.  We discussed that testing is beneficial for several reasons including knowing how to follow individuals for their cancer risks, identifying whether potential treatment options would be beneficial, and understanding if other family members could be at risk for  cancer and allowing them to undergo genetic testing.   We reviewed the characteristics, features and inheritance patterns of hereditary cancer syndromes. We also discussed genetic testing, including the appropriate family members to test, the process of testing, insurance coverage and turn-around-time for results. We discussed the implications of a negative, positive, carrier and/or variant of uncertain significant result. We  recommended Kelli Estrada pursue genetic testing for a panel that includes genes associated with breast and other cancers.   The CustomNext-Cancer+RNAinsight panel offered by [facility] includes sequencing and rearrangement analysis for the following 47 genes:  APC, ATM, AXIN2, BARD1, BMPR1A, BRCA1, BRCA2, BRIP1, CDH1, CDK4, CDKN2A, CHEK2, DICER1, EPCAM, GREM1, HOXB13, MEN1, MLH1, MSH2, MSH3, MSH6, MUTYH, NBN, NF1, NF2, NTHL1, PALB2, PMS2, POLD1, POLE, PTEN, RAD51C, RAD51D, RECQL, RET, SDHA, SDHAF2, SDHB, SDHC, SDHD, SMAD4, SMARCA4, STK11, TP53, TSC1, TSC2, and VHL.  RNA data is routinely analyzed for use in variant interpretation for all genes.   Kelli Estrada personal and family history of cancer, does not meet NCCN criteria for genetic testing; however, we discussed that given her age of diagnosis, her grandmother's diagnosis of breast cancer, and limited information about her family members, it is reasonable to have genetic testing.  She is interested in this information, especially for her daughter.   PLAN: After considering the risks, benefits, and limitations, Kelli Estrada provided informed consent to pursue genetic testing and the blood sample was sent to [facility] for analysis of the CustomNext-Cancer +RNAinsight. Results should be available within approximately 3 weeks' time, at which point they will be disclosed by telephone to Kelli Estrada, as will any additional recommendations warranted by these results. Kelli Estrada will receive a summary of her genetic counseling visit and a copy of her results once available. This information will also be available in Epic.   Kelli Estrada questions were answered to her satisfaction today. Our contact information was provided should additional questions or concerns arise. Thank you for the referral and allowing [place] to share in the care of your patient.   Santez Woodcox M. Joette Catching, [place], [facility] Genetic Counselor [email or web address] (P) [phone]  The  patient was seen for a total of 15 minutes in face-to-face genetic counseling.  She was accompanied by her daughter.  Dr. Chryl Heck was available to discuss this case as needed.    _______________________________________________________________________ For Office Staff:  Number of people involved in session: 2 Was an Intern/ student involved with case: no

## 2022-08-23 NOTE — Telephone Encounter (Signed)
Kelli Estrada, counseling intern, called patient after the the counselor missed seeing them in Breast Clinic.   The patient reported they are currently trying to get their finances together to buy a tiny home. The patient reported they are living with their daughter and grandchildren. The patient reported they are seeing a counselor now but will call to schedule an appointment if needs arise in the future.   Kelli Estrada,  Counseling Intern  [phone] [email or web address]

## 2022-08-23 NOTE — Telephone Encounter (Signed)
[employer] 2021-05 - Specimen Collection Study to Evaluate Biomarkers in Subjects with Cancer    Research nurse was unable to meet with this pt at breast clinic on 08/22/22 to introduce the specimen study.  Therefore, the nurse attempted to reach the pt this morning to discuss the study and answer any questions about the study.  The pt was unavailable by phone.  The nurse left a message asking the pt to return the nurse's call at her convenience.  The pt was told that this study and her participation is completely optional .  Will await pt's return call.  Brion Aliment RN, BSN, CCRP Clinical Research Nurse Lead 08/23/2022 11:46 AM

## 2022-08-23 NOTE — Assessment & Plan Note (Signed)
This is a very pleasant 61 year old female patient with newly diagnosed left breast invasive ductal carcinoma T1N0 grade 2 ER 100% positive strong staining PR 90% positive strong staining Ki-67 of 10% and HER2 1+ by IHC referred to breast [facility] for additional recommendations.   Given early stage, strong ER/PR positivity and no evidence of lymph node involvement we have discussed about upfront surgery followed by Oncotype testing.  We have discussed about Oncotype Dx score which is a well validated prognostic scoring system which can predict outcome with endocrine therapy alone and whether chemotherapy reduces recurrence.  Typically in patients with ER positive cancers that are node negative if the RS score is high typically greater than or equal to 26, chemotherapy is recommended.  In women with intermediate recurrence score younger than 11, there can still be some role for chemotherapy in addition to endocrine therapy especially if the recurrence score is between 21-25. If chemotherapy is needed, this will precede radiation and then after radiation she will continue on antiestrogen therapy.  If she is not deemed to be an adjuvant chemotherapy candidate, we will proceed with radiation followed by antiestrogen therapy.  We have discussed about antiestrogen therapy today as well. We have discussed options for antiestrogen therapy today  With regards to Tamoxifen, we discussed that this is a SERM, selective estrogen receptor modulator. We discussed mechanism of action of Tamoxifen, adverse effects on Tamoxifen including but not limited to post menopausal symptoms, increased risk of DVT/PE, increased risk of endometrial cancer, questionable cataracts with long term use and increased risk of cardiovascular events in the study which was not statistically significant. A benefit from Tamoxifen would be improvement in bone density. With regards to aromatase inhibitors, we discussed mechanism of action, adverse  effects including but not limited to post menopausal symptoms, arthralgias, myalgias, increased risk of cardiovascular events and bone loss.   At this time she is leaning towards anastrozole or aromatase inhibitors.  She will return to clinic after surgery to review final pathology, Oncotype results and discuss additional recommendations.

## 2022-08-27 ENCOUNTER — Telehealth: Payer: Self-pay | Admitting: *Deleted

## 2022-08-27 ENCOUNTER — Encounter: Payer: Self-pay | Admitting: Genetic Counselor

## 2022-08-27 ENCOUNTER — Telehealth: Payer: Self-pay | Admitting: Hematology and Oncology

## 2022-08-27 DIAGNOSIS — Z1379 Encounter for other screening for genetic and chromosomal anomalies: Secondary | ICD-10-CM | POA: Insufficient documentation

## 2022-08-27 NOTE — Telephone Encounter (Signed)
Called patient per 11/10 in basket. Patient notified.

## 2022-08-27 NOTE — Telephone Encounter (Signed)
[employer] 2021-05 - Specimen Collection Study to Evaluate Biomarkers in Subjects with Cancer    The pt called the nurse this afternoon and left a message.  The nurse returned her call and discussed the optional specimen study with her over the phone.  The pt was interested.  She wanted the consent form emailed to her so she could read about the study.  The nurse confirmed the pt's email address, and then sent the consent form to the pt.  The pt said that she may be able to come to the [facility] tomorrow and meet with the research nurse about the study.  The nurse asked the pt to call if she is able to come by the [facility].   The pt was told that her blood must be drawn before her surgery date and before she begins treatment.  The pt was thanked for her interest in the study. Brion Aliment RN, BSN, [place] Clinical Research Nurse Lead 08/27/2022 5:16 PM

## 2022-08-28 ENCOUNTER — Inpatient Hospital Stay: Payer: Medicare Other

## 2022-08-28 ENCOUNTER — Encounter: Payer: Self-pay | Admitting: *Deleted

## 2022-08-28 ENCOUNTER — Other Ambulatory Visit: Payer: Self-pay | Admitting: *Deleted

## 2022-08-28 ENCOUNTER — Inpatient Hospital Stay: Payer: Medicare Other | Admitting: *Deleted

## 2022-08-28 ENCOUNTER — Other Ambulatory Visit: Payer: Self-pay

## 2022-08-28 DIAGNOSIS — Z170 Estrogen receptor positive status [ER+]: Secondary | ICD-10-CM

## 2022-08-28 LAB — RESEARCH LABS

## 2022-08-28 NOTE — Research (Signed)
[employer] 2021-05 - Specimen Collection Study to Evaluate Biomarkers in Subjects with Cancer    08/28/2022  SECOND REVIEW:  This Coordinator has reviewed this patient's inclusion and exclusion criteria as a second review and confirms Kelli Estrada is eligible for study participation.  Patient may continue with enrollment.   Carol Ada, RT(R)(T) Clinical Research Coordinator

## 2022-08-28 NOTE — Research (Signed)
[employer] 2021-05 - Specimen Collection Study to Evaluate Biomarkers in Subjects with Cancer    Patient Kelli Estrada was identified by Dr.Iruku as a potential candidate for the above listed study.  This Clinical Research Nurse along with Johny Drilling, research coordinator, met with [name], [id number] on 08/28/22 in a manner and location that ensures patient privacy to discuss participation in the above listed research study.  Patient is Unaccompanied.  Patient was previously provided with informed consent documents.  Patient confirmed they have read the informed consent documents.  As outlined in the informed consent form, this Nurse and Angelique Blonder discussed the purpose of the research study, the investigational nature of the study, study procedures and requirements for study participation, potential risks and benefits of study participation, as well as alternatives to participation.  This study is not blinded or double-blinded. The patient understands participation is voluntary and they may withdraw from study participation at any time.  This study does not involve randomization.  This study does not involve an investigational drug or device. This study does not involve a placebo. Patient understands enrollment is pending full eligibility review.   Confidentiality and how the patient's information will be used as part of study participation were discussed.  Patient was informed there is reimbursement provided for their time and effort spent on trial participation.  The patient is encouraged to discuss research study participation with their insurance provider to determine what costs they may incur as part of study participation, including research related injury.    All questions were answered to patient's satisfaction.  The informed consent with embedded HIPAA language was reviewed page by page.  The patient's mental and emotional status is appropriate to provide informed consent, and the  patient verbalizes an understanding of study participation.  Patient has agreed to participate in the above listed research study and has voluntarily signed the informed consent [employer] Approved version [phone] (revised [phone]) with embedded HIPAA language, version [employer] approved Version [id number] (revised [id number])  on 08/28/22 at 1:15PM.  The patient was provided with a copy of the signed informed consent form with embedded HIPAA language for their reference.  No study specific procedures were obtained prior to the signing of the informed consent document.  Approximately 25 minutes were spent with the patient reviewing the informed consent documents.  Patient was not requested to complete a Release of Information form.   Eligibilty:  This Nurse has reviewed this patient's inclusion and exclusion criteria with the patient, and this nurse confirmed Brigitte Soderberg is eligible for study participation. 2nd Eligibility review was completed by Carol Ada, research coordinator.  Eligibility confirmed by treating investigator, Dr. Minna Antis also agrees that patient should proceed with enrollment. Patient will continue with enrollment.  Medical History Data: The research nurse asked the pt the following medical questions after the pt signed the consent form.  The pt provided the following answers.   Menopausal status: Kelli Estrada is post menopausal. Medical History:  High Blood Pressure  No Coronary Artery Disease No Lupus    No Rheumatoid Arthritis  No Diabetes   No      Lynch Syndrome  No  Is the patient currently taking a magnesium supplement?   Yes If yes, dose and frequency? The pt is unsure about her dose, but she reports taking the supplement daily.  The pt was asked to call the research nurse with the pt's magnesium dose.  Indication? She  takes the supplement for symptom relief of anxiety,  fatigue and stress. Start date? She states she began the supplement on August 1st  2023  Does the patient have a personal history of cancer (greater than 5 years ago)?  No  Does the patient have a family history of cancer in 1st or 2nd degree relatives? Yes  If yes, Relationship(s) and Cancer type(s)? 1st degree -mother- lung cancer  2nd degree -grandmother (maternal) - breast cancer  Does the patient have history of alcohol consumption? Yes   If yes, current or former? former If former, year stopped? 2021 Number of years? 30 years Drinks per week? 5 drinks/week  Does the patient have history of cigarette, cigar, pipe, or chewing tobacco use?  No   Blood Collection:  Research blood obtained by venipuncture.  Patient tolerated the blood draw well without any adverse events.    Gift Card: $50 gift card was given to the pt by Remer Macho, research specialist.   The pt was thanked for her participation in this data and specimen study.  The research nurse will request the pt's tumor block to submit to the study and enter her required study data.    Brion Aliment RN, BSN, CCRP Clinical Research Nurse Lead 08/28/2022 1:48 PM

## 2022-08-29 ENCOUNTER — Encounter (HOSPITAL_COMMUNITY): Payer: Self-pay

## 2022-08-29 ENCOUNTER — Telehealth: Payer: Self-pay

## 2022-08-29 ENCOUNTER — Encounter: Payer: Self-pay | Admitting: Physician Assistant

## 2022-08-29 NOTE — Telephone Encounter (Signed)
Made patient aware that her culture indicates Augmentin covers bacteria well. No change in tx recommended. Patient voiced understanding

## 2022-08-29 NOTE — Telephone Encounter (Signed)
-----   Message from [facility], [place] sent at 08/29/2022  2:19 PM EST ----- Please let pt know her culture indicates Augmentin covers bacteria well. No change in tx recommended. ----- Message ----- From: Jennette Bill Sent: 08/29/2022   9:23 AM EST To: Reynaldo Minium, PA-C

## 2022-08-30 ENCOUNTER — Encounter: Payer: Self-pay | Admitting: *Deleted

## 2022-08-30 ENCOUNTER — Telehealth: Payer: Self-pay | Admitting: *Deleted

## 2022-08-30 NOTE — Telephone Encounter (Signed)
Spoke with patient to follow up with [facility] 11/8 and assess navigation needs.  Patient denies any questions or concerns at this time.  Encouraged her to call should anything arise.  Patient verbalized understanding.

## 2022-08-31 ENCOUNTER — Ambulatory Visit: Payer: Self-pay | Admitting: Genetic Counselor

## 2022-08-31 ENCOUNTER — Telehealth: Payer: Self-pay | Admitting: Genetic Counselor

## 2022-08-31 DIAGNOSIS — Z803 Family history of malignant neoplasm of breast: Secondary | ICD-10-CM

## 2022-08-31 DIAGNOSIS — Z170 Estrogen receptor positive status [ER+]: Secondary | ICD-10-CM

## 2022-08-31 DIAGNOSIS — Z1379 Encounter for other screening for genetic and chromosomal anomalies: Secondary | ICD-10-CM

## 2022-08-31 NOTE — Telephone Encounter (Signed)
Revealed negative genetics.   

## 2022-09-03 ENCOUNTER — Other Ambulatory Visit: Payer: Self-pay | Admitting: *Deleted

## 2022-09-03 DIAGNOSIS — C50112 Malignant neoplasm of central portion of left female breast: Secondary | ICD-10-CM

## 2022-09-04 NOTE — Progress Notes (Signed)
HPI:   Ms. Newstrom was previously seen in the [place] clinic due to a personal history of breast cancer and concerns regarding a hereditary predisposition to cancer. Please refer to our prior cancer genetics clinic note for more information regarding our discussion, assessment and recommendations, at the time. Ms. Doering recent genetic test results were disclosed to her, as were recommendations warranted by these results. These results and recommendations are discussed in more detail below.  CANCER HISTORY:  In 2023, at the age of 61, Ms. Sun was diagnosed with invasive ductal carcinoma of the left breast (ER+/PR+/HER2-).    Oncology History  Malignant neoplasm of central portion of left breast in female, estrogen receptor positive ([place])  07/18/2022 Imaging   Mammogram showed indeterminate left breast mass central to the nipple in the retroareolar region, indeterminate lymph node.  Ultrasound done showed 1.9 x 1.4 x 1.5 cm taller than wide irregular mass in the left breast highly suggestive of malignancy.  No significant abnormality seen in the left axilla   08/16/2022 Pathology Results   Pathology from the left breast mass showed grade 2 invasive ductal carcinoma ER 100% positive strong staining PR 90% positive strong staining Ki-67 of 10% and HER2 1+ by [facility]   08/20/2022 Initial Diagnosis   Malignant neoplasm of central portion of left breast in female, estrogen receptor positive ([place])   08/27/2022 Genetic Testing   Negative CustomNext-Cancer +RNAinsight Panel.  Report date is 08/29/2022.   The CustomNext-Cancer+RNAinsight panel offered by [facility] includes sequencing and rearrangement analysis for the following 47 genes:  APC, ATM, AXIN2, BARD1, BMPR1A, BRCA1, BRCA2, BRIP1, CDH1, CDK4, CDKN2A, CHEK2, DICER1, EPCAM, GREM1, HOXB13, MEN1, MLH1, MSH2, MSH3, MSH6, MUTYH, NBN, NF1, NF2, NTHL1, PALB2, PMS2, POLD1, POLE, PTEN, RAD51C, RAD51D, RECQL, RET, SDHA, SDHAF2, SDHB,  SDHC, SDHD, SMAD4, SMARCA4, STK11, TP53, TSC1, TSC2, and VHL.  RNA data is routinely analyzed for use in variant interpretation for all genes.     FAMILY HISTORY:  We obtained a detailed, 4-generation family history.  Significant diagnoses are listed below: Family History  Problem Relation Age of Onset   Lung cancer Mother        dx > 44   Breast cancer Maternal Grandmother 42     Ms. Vandezande is unaware of previous family history of genetic testing for hereditary cancer risks. There is no reported Ashkenazi Jewish ancestry. There is no known consanguinity.  GENETIC TEST RESULTS:  The [facility] CustomNext-Cancer +RNAinsight  Panel found no pathogenic mutations.   The CustomNext-Cancer+RNAinsight panel offered by [facility] includes sequencing and rearrangement analysis for the following 47 genes:  APC, ATM, AXIN2, BARD1, BMPR1A, BRCA1, BRCA2, BRIP1, CDH1, CDK4, CDKN2A, CHEK2, DICER1, EPCAM, GREM1, HOXB13, MEN1, MLH1, MSH2, MSH3, MSH6, MUTYH, NBN, NF1, NF2, NTHL1, PALB2, PMS2, POLD1, POLE, PTEN, RAD51C, RAD51D, RECQL, RET, SDHA, SDHAF2, SDHB, SDHC, SDHD, SMAD4, SMARCA4, STK11, TP53, TSC1, TSC2, and VHL.  RNA data is routinely analyzed for use in variant interpretation for all genes. .   The test report has been scanned into EPIC and is located under the Molecular Pathology section of the Results Review tab.  A portion of the result report is included below for reference. Genetic testing reported out on August 29, 2022.      Even though a pathogenic variant was not identified, possible explanations for the cancer in the family may include: There may be no hereditary risk for cancer in the family. The cancers in Ms. Golonka and/or her family may be  sporadic/familial or due to other genetic and environmental factors. There may be a gene mutation in one of these genes that current testing methods cannot detect but that chance is small. There could be another gene that has not yet been  discovered, or that we have not yet tested, that is responsible for the cancer diagnoses in the family.    Therefore, it is important to remain in touch with cancer genetics in the future so that we can continue to offer Ms. Glasby the most up to date genetic testing.     ADDITIONAL GENETIC TESTING:  We discussed with Ms. Eads that her genetic testing was fairly extensive.  If there are additional relevant genes identified to increase cancer risk that can be analyzed in the future, we would be happy to discuss and coordinate this testing at that time.      CANCER SCREENING RECOMMENDATIONS:  Ms. Filyaw test result is considered negative (normal).  This means that we have not identified a hereditary cause for her personal history of breast cancer at this time.   An individual's cancer risk and medical management are not determined by genetic test results alone. Overall cancer risk assessment incorporates additional factors, including personal medical history, family history, and any available genetic information that may result in a personalized plan for cancer prevention and surveillance. Therefore, it is recommended she continue to follow the cancer management and screening guidelines provided by her oncology and primary healthcare provider.   RECOMMENDATIONS FOR FAMILY MEMBERS:   Since she did not inherit a identifiable mutation in a cancer predisposition gene included on this panel, her children could not have inherited a known mutation from her in one of these genes. Individuals in this family might be at some increased risk of developing cancer, over the general population risk, due to the family history of cancer.  Individuals in the family should notify their providers of the family history of cancer. We recommend women in this family have a yearly mammogram beginning at age 36, or 75 years younger than the earliest onset of cancer, an annual clinical breast exam, and perform monthly  breast self-exams.    FOLLOW-UP:  Lastly, we discussed with Ms. Delancy that cancer genetics is a rapidly advancing field and it is possible that new genetic tests will be appropriate for her and/or her family members in the future. We encouraged her to remain in contact with cancer genetics on an annual basis so we can update her personal and family histories and let her know of advances in cancer genetics that may benefit this family.   Our contact number was provided. Ms. Darr questions were answered to her satisfaction, and she knows she is welcome to call us at anytime with additional questions or concerns.   Keyetta Hollingworth M. Joette Catching, [place], [facility] Genetic Counselor [email or web address] [email or web address] (P) [phone]

## 2022-09-10 ENCOUNTER — Encounter (HOSPITAL_BASED_OUTPATIENT_CLINIC_OR_DEPARTMENT_OTHER): Payer: Self-pay | Admitting: General Surgery

## 2022-09-10 NOTE — Progress Notes (Signed)
Dr. Roderic Palau reviewed pt's chart and pt states she has a flare up of bronchitis.  Pt states when she is stressed her bronchitis flares up with a sore throat and dry cough.  Pt denies fever at this time.  Pt was prescribed Augmentin and is currently taking along with other medications.  Dr. Ola Spurr request RN to call pt tomorrow, 09/11/2022, to see if she is feeling better, and the cough has not become productive.  Also to ask pt if she has developed a fever.  RN will reassess with anesthesia tomorrow, 09/11/2022 after speaking with the pt.

## 2022-09-11 NOTE — Progress Notes (Signed)
Patient states she feels like her cough is better today, not coughing as much. Denies any fever, cough is also non-productive. Made Dr Lanetta Inch aware of patient status. OK to proceed with surgery.

## 2022-09-14 ENCOUNTER — Encounter (HOSPITAL_BASED_OUTPATIENT_CLINIC_OR_DEPARTMENT_OTHER)
Admission: RE | Disposition: A | Discharge status: Home / Self Care | Payer: Self-pay | Source: Home / Self Care | Attending: General Surgery

## 2022-09-14 ENCOUNTER — Ambulatory Visit (HOSPITAL_BASED_OUTPATIENT_CLINIC_OR_DEPARTMENT_OTHER): Payer: Medicare Other | Admitting: Anesthesiology

## 2022-09-14 ENCOUNTER — Encounter (HOSPITAL_COMMUNITY)
Admission: RE | Admit: 2022-09-14 | Discharge: 2022-09-14 | Disposition: A | Discharge status: Home / Self Care | Payer: Medicare Other | Source: Ambulatory Visit | Attending: General Surgery | Admitting: General Surgery

## 2022-09-14 ENCOUNTER — Other Ambulatory Visit: Payer: Self-pay

## 2022-09-14 ENCOUNTER — Ambulatory Visit (HOSPITAL_BASED_OUTPATIENT_CLINIC_OR_DEPARTMENT_OTHER)
Admission: RE | Admit: 2022-09-14 | Discharge: 2022-09-14 | Disposition: A | Discharge status: Home / Self Care | Payer: Medicare Other | Attending: General Surgery | Admitting: General Surgery

## 2022-09-14 ENCOUNTER — Encounter (HOSPITAL_BASED_OUTPATIENT_CLINIC_OR_DEPARTMENT_OTHER): Payer: Self-pay | Admitting: General Surgery

## 2022-09-14 DIAGNOSIS — F32A Depression, unspecified: Secondary | ICD-10-CM | POA: Insufficient documentation

## 2022-09-14 DIAGNOSIS — Z170 Estrogen receptor positive status [ER+]: Secondary | ICD-10-CM | POA: Insufficient documentation

## 2022-09-14 DIAGNOSIS — Z803 Family history of malignant neoplasm of breast: Secondary | ICD-10-CM | POA: Insufficient documentation

## 2022-09-14 DIAGNOSIS — Z6841 Body Mass Index (BMI) 40.0 and over, adult: Secondary | ICD-10-CM | POA: Diagnosis not present

## 2022-09-14 DIAGNOSIS — G8929 Other chronic pain: Secondary | ICD-10-CM | POA: Diagnosis not present

## 2022-09-14 DIAGNOSIS — G4733 Obstructive sleep apnea (adult) (pediatric): Secondary | ICD-10-CM | POA: Diagnosis not present

## 2022-09-14 DIAGNOSIS — C50912 Malignant neoplasm of unspecified site of left female breast: Secondary | ICD-10-CM | POA: Diagnosis not present

## 2022-09-14 DIAGNOSIS — F418 Other specified anxiety disorders: Secondary | ICD-10-CM | POA: Diagnosis not present

## 2022-09-14 DIAGNOSIS — K219 Gastro-esophageal reflux disease without esophagitis: Secondary | ICD-10-CM | POA: Diagnosis not present

## 2022-09-14 DIAGNOSIS — F4310 Post-traumatic stress disorder, unspecified: Secondary | ICD-10-CM | POA: Diagnosis not present

## 2022-09-14 DIAGNOSIS — F1120 Opioid dependence, uncomplicated: Secondary | ICD-10-CM | POA: Insufficient documentation

## 2022-09-14 DIAGNOSIS — E119 Type 2 diabetes mellitus without complications: Secondary | ICD-10-CM | POA: Insufficient documentation

## 2022-09-14 DIAGNOSIS — Z7984 Long term (current) use of oral hypoglycemic drugs: Secondary | ICD-10-CM | POA: Insufficient documentation

## 2022-09-14 DIAGNOSIS — C773 Secondary and unspecified malignant neoplasm of axilla and upper limb lymph nodes: Secondary | ICD-10-CM | POA: Insufficient documentation

## 2022-09-14 DIAGNOSIS — Z79899 Other long term (current) drug therapy: Secondary | ICD-10-CM | POA: Insufficient documentation

## 2022-09-14 DIAGNOSIS — D0512 Intraductal carcinoma in situ of left breast: Secondary | ICD-10-CM | POA: Diagnosis present

## 2022-09-14 DIAGNOSIS — F419 Anxiety disorder, unspecified: Secondary | ICD-10-CM | POA: Insufficient documentation

## 2022-09-14 HISTORY — DX: Prediabetes: R73.03

## 2022-09-14 HISTORY — PX: BREAST LUMPECTOMY WITH SENTINEL LYMPH NODE BIOPSY: SHX5597

## 2022-09-14 HISTORY — DX: Sleep apnea, unspecified: G47.30

## 2022-09-14 LAB — GLUCOSE, CAPILLARY
Glucose-Capillary: 103 mg/dL — ABNORMAL HIGH (ref 70–99)
Glucose-Capillary: 92 mg/dL (ref 70–99)

## 2022-09-14 SURGERY — BREAST LUMPECTOMY WITH SENTINEL LYMPH NODE BX
Anesthesia: General | Site: Breast | Laterality: Left

## 2022-09-14 MED ORDER — TECHNETIUM TC 99M TILMANOCEPT KIT
1.0000 | PACK | Freq: Once | INTRAVENOUS | Status: AC | PRN
  Administered 2022-09-14: 1 via INTRADERMAL

## 2022-09-14 MED ORDER — GABAPENTIN 300 MG PO CAPS
300.0000 mg | ORAL_CAPSULE | ORAL | Status: AC
  Administered 2022-09-14: 300 mg via ORAL

## 2022-09-14 MED ORDER — ACETAMINOPHEN 500 MG PO TABS
ORAL_TABLET | ORAL | Status: AC
  Filled 2022-09-14: qty 2

## 2022-09-14 MED ORDER — BUPIVACAINE HCL (PF) 0.5 % IJ SOLN
INTRAMUSCULAR | Status: DC | PRN
  Administered 2022-09-14: 15 mL

## 2022-09-14 MED ORDER — BUPIVACAINE-EPINEPHRINE 0.25% -1:200000 IJ SOLN
INTRAMUSCULAR | Status: DC | PRN
  Administered 2022-09-14: 25 mL

## 2022-09-14 MED ORDER — FENTANYL CITRATE (PF) 100 MCG/2ML IJ SOLN
INTRAMUSCULAR | Status: AC
  Filled 2022-09-14: qty 2

## 2022-09-14 MED ORDER — MIDAZOLAM HCL 2 MG/2ML IJ SOLN
INTRAMUSCULAR | Status: AC
  Filled 2022-09-14: qty 2

## 2022-09-14 MED ORDER — OXYCODONE HCL 5 MG/5ML PO SOLN: 5.0000 mg | Freq: Once | ORAL | Status: AC | PRN

## 2022-09-14 MED ORDER — CEFAZOLIN SODIUM-DEXTROSE 2-4 GM/100ML-% IV SOLN
2.0000 g | INTRAVENOUS | Status: AC
  Administered 2022-09-14: 2 g via INTRAVENOUS

## 2022-09-14 MED ORDER — PROMETHAZINE HCL 25 MG/ML IJ SOLN: 6.2500 mg | INTRAMUSCULAR | Status: DC | PRN

## 2022-09-14 MED ORDER — PROPOFOL 500 MG/50ML IV EMUL
INTRAVENOUS | Status: DC | PRN
  Administered 2022-09-14: 200 ug/kg/min via INTRAVENOUS

## 2022-09-14 MED ORDER — SUCCINYLCHOLINE CHLORIDE 200 MG/10ML IV SOSY
PREFILLED_SYRINGE | INTRAVENOUS | Status: AC
  Filled 2022-09-14: qty 10

## 2022-09-14 MED ORDER — LIDOCAINE 2% (20 MG/ML) 5 ML SYRINGE
INTRAMUSCULAR | Status: AC
  Filled 2022-09-14: qty 5

## 2022-09-14 MED ORDER — LACTATED RINGERS IV SOLN: INTRAVENOUS | Status: DC

## 2022-09-14 MED ORDER — CELECOXIB 200 MG PO CAPS: 200.0000 mg | ORAL_CAPSULE | Freq: Once | ORAL | Status: DC

## 2022-09-14 MED ORDER — CEFAZOLIN SODIUM-DEXTROSE 2-4 GM/100ML-% IV SOLN
INTRAVENOUS | Status: AC
  Filled 2022-09-14: qty 100

## 2022-09-14 MED ORDER — EPHEDRINE SULFATE (PRESSORS) 50 MG/ML IJ SOLN
INTRAMUSCULAR | Status: DC | PRN
  Administered 2022-09-14: 10 mg via INTRAVENOUS

## 2022-09-14 MED ORDER — PHENYLEPHRINE 80 MCG/ML (10ML) SYRINGE FOR IV PUSH (FOR BLOOD PRESSURE SUPPORT)
PREFILLED_SYRINGE | INTRAVENOUS | Status: AC
  Filled 2022-09-14: qty 10

## 2022-09-14 MED ORDER — FENTANYL CITRATE (PF) 100 MCG/2ML IJ SOLN
25.0000 ug | INTRAMUSCULAR | Status: DC | PRN
  Administered 2022-09-14: 50 ug via INTRAVENOUS

## 2022-09-14 MED ORDER — FENTANYL CITRATE (PF) 100 MCG/2ML IJ SOLN
100.0000 ug | Freq: Once | INTRAMUSCULAR | Status: AC
  Administered 2022-09-14: 50 ug via INTRAVENOUS

## 2022-09-14 MED ORDER — GABAPENTIN 300 MG PO CAPS
ORAL_CAPSULE | ORAL | Status: AC
  Filled 2022-09-14: qty 1

## 2022-09-14 MED ORDER — PROPOFOL 10 MG/ML IV BOLUS
INTRAVENOUS | Status: DC | PRN
  Administered 2022-09-14: 200 mg via INTRAVENOUS

## 2022-09-14 MED ORDER — CHLORHEXIDINE GLUCONATE CLOTH 2 % EX PADS: 6.0000 | MEDICATED_PAD | Freq: Once | CUTANEOUS | Status: DC

## 2022-09-14 MED ORDER — ACETAMINOPHEN 500 MG PO TABS: 1000.0000 mg | ORAL_TABLET | ORAL | Status: DC

## 2022-09-14 MED ORDER — DEXAMETHASONE SODIUM PHOSPHATE 10 MG/ML IJ SOLN
INTRAMUSCULAR | Status: AC
  Filled 2022-09-14: qty 1

## 2022-09-14 MED ORDER — EPHEDRINE 5 MG/ML INJ
INTRAVENOUS | Status: AC
  Filled 2022-09-14: qty 5

## 2022-09-14 MED ORDER — BUPIVACAINE LIPOSOME 1.3 % IJ SUSP
INTRAMUSCULAR | Status: DC | PRN
  Administered 2022-09-14: 10 mL

## 2022-09-14 MED ORDER — FENTANYL CITRATE (PF) 100 MCG/2ML IJ SOLN
INTRAMUSCULAR | Status: DC | PRN
  Administered 2022-09-14: 50 ug via INTRAVENOUS

## 2022-09-14 MED ORDER — PHENYLEPHRINE HCL (PRESSORS) 10 MG/ML IV SOLN
INTRAVENOUS | Status: DC | PRN
  Administered 2022-09-14 (×2): 80 ug via INTRAVENOUS

## 2022-09-14 MED ORDER — CELECOXIB 200 MG PO CAPS
ORAL_CAPSULE | ORAL | Status: AC
  Filled 2022-09-14: qty 1

## 2022-09-14 MED ORDER — OXYCODONE HCL 5 MG PO TABS: 5.0000 mg | ORAL_TABLET | Freq: Four times a day (QID) | ORAL | 0 refills | Status: DC | PRN

## 2022-09-14 MED ORDER — CELECOXIB 200 MG PO CAPS
200.0000 mg | ORAL_CAPSULE | ORAL | Status: AC
  Administered 2022-09-14: 200 mg via ORAL

## 2022-09-14 MED ORDER — ONDANSETRON HCL 4 MG/2ML IJ SOLN
INTRAMUSCULAR | Status: AC
  Filled 2022-09-14: qty 2

## 2022-09-14 MED ORDER — OXYCODONE HCL 5 MG PO TABS
5.0000 mg | ORAL_TABLET | Freq: Once | ORAL | Status: AC | PRN
  Administered 2022-09-14: 5 mg via ORAL

## 2022-09-14 MED ORDER — OXYCODONE HCL 5 MG PO TABS
ORAL_TABLET | ORAL | Status: AC
  Filled 2022-09-14: qty 1

## 2022-09-14 MED ORDER — LIDOCAINE HCL (CARDIAC) PF 100 MG/5ML IV SOSY
PREFILLED_SYRINGE | INTRAVENOUS | Status: DC | PRN
  Administered 2022-09-14: 60 mg via INTRAVENOUS

## 2022-09-14 MED ORDER — MIDAZOLAM HCL 2 MG/2ML IJ SOLN
2.0000 mg | Freq: Once | INTRAMUSCULAR | Status: AC
  Administered 2022-09-14: 1 mg via INTRAVENOUS

## 2022-09-14 MED ORDER — ACETAMINOPHEN 500 MG PO TABS
1000.0000 mg | ORAL_TABLET | Freq: Once | ORAL | Status: AC
  Administered 2022-09-14: 1000 mg via ORAL

## 2022-09-14 MED ORDER — ATROPINE SULFATE 0.4 MG/ML IV SOLN
INTRAVENOUS | Status: AC
  Filled 2022-09-14: qty 1

## 2022-09-14 SURGICAL SUPPLY — 42 items
ADH SKN CLS APL DERMABOND .7 (GAUZE/BANDAGES/DRESSINGS) ×1
APL PRP STRL LF DISP 70% ISPRP (MISCELLANEOUS) ×1
APPLIER CLIP 11 MED OPEN (CLIP) ×2
APR CLP MED 11 20 MLT OPN (CLIP) ×2
BLADE SURG 15 STRL LF DISP TIS (BLADE) ×1 IMPLANT
BLADE SURG 15 STRL SS (BLADE) ×1
CANISTER SUCT 1200ML W/VALVE (MISCELLANEOUS) ×1 IMPLANT
CHLORAPREP W/TINT 26 (MISCELLANEOUS) ×1 IMPLANT
CLIP APPLIE 11 MED OPEN (CLIP) ×1 IMPLANT
COVER BACK TABLE 60X90IN (DRAPES) ×1 IMPLANT
COVER MAYO STAND STRL (DRAPES) ×1 IMPLANT
COVER PROBE CYLINDRICAL 5X96 (MISCELLANEOUS) ×1 IMPLANT
DERMABOND ADVANCED .7 DNX12 (GAUZE/BANDAGES/DRESSINGS) ×1 IMPLANT
DRAPE LAPAROSCOPIC ABDOMINAL (DRAPES) ×1 IMPLANT
DRAPE UTILITY XL STRL (DRAPES) ×1 IMPLANT
ELECT COATED BLADE 2.86 ST (ELECTRODE) ×1 IMPLANT
ELECT REM PT RETURN 9FT ADLT (ELECTROSURGICAL) ×1
ELECTRODE REM PT RTRN 9FT ADLT (ELECTROSURGICAL) ×1 IMPLANT
GLOVE BIO SURGEON STRL SZ7.5 (GLOVE) ×1 IMPLANT
GOWN STRL REUS W/ TWL LRG LVL3 (GOWN DISPOSABLE) ×2 IMPLANT
GOWN STRL REUS W/TWL LRG LVL3 (GOWN DISPOSABLE) ×2
ILLUMINATOR WAVEGUIDE N/F (MISCELLANEOUS) IMPLANT
KIT MARKER MARGIN INK (KITS) IMPLANT
LIGHT WAVEGUIDE WIDE FLAT (MISCELLANEOUS) IMPLANT
NDL HYPO 25X1 1.5 SAFETY (NEEDLE) ×1 IMPLANT
NDL SAFETY ECLIP 18X1.5 (MISCELLANEOUS) IMPLANT
NEEDLE HYPO 25X1 1.5 SAFETY (NEEDLE) ×1 IMPLANT
NS IRRIG 1000ML POUR BTL (IV SOLUTION) ×1 IMPLANT
PACK BASIN DAY SURGERY FS (CUSTOM PROCEDURE TRAY) ×1 IMPLANT
PENCIL SMOKE EVACUATOR (MISCELLANEOUS) ×1 IMPLANT
SLEEVE SCD COMPRESS KNEE MED (STOCKING) ×1 IMPLANT
SPIKE FLUID TRANSFER (MISCELLANEOUS) IMPLANT
SPONGE T-LAP 18X18 ~~LOC~~+RFID (SPONGE) ×1 IMPLANT
SUT MON AB 4-0 PC3 18 (SUTURE) ×1 IMPLANT
SUT SILK 3 0 PS 1 (SUTURE) IMPLANT
SUT VICRYL 3-0 CR8 SH (SUTURE) ×1 IMPLANT
SYR CONTROL 10ML LL (SYRINGE) ×1 IMPLANT
TOWEL GREEN STERILE FF (TOWEL DISPOSABLE) ×1 IMPLANT
TRACER MAGTRACE VIAL (MISCELLANEOUS) IMPLANT
TRAY FAXITRON CT DISP (TRAY / TRAY PROCEDURE) IMPLANT
TUBE CONNECTING 20X1/4 (TUBING) ×1 IMPLANT
YANKAUER SUCT BULB TIP NO VENT (SUCTIONS) ×1 IMPLANT

## 2022-09-14 NOTE — Anesthesia Preprocedure Evaluation (Addendum)
Anesthesia Evaluation  Patient identified by MRN, date of birth, ID band Patient awake    Reviewed: Allergy & Precautions, NPO status , Patient's Chart, lab work & pertinent test results  History of Anesthesia Complications Negative for: history of anesthetic complications  Airway Mallampati: II  TM Distance: >3 FB Neck ROM: Full    Dental  (+) Dental Advisory Given, Chipped, Missing, Poor Dentition, Loose   Pulmonary sleep apnea (intermittent CPAP use)    Pulmonary exam normal        Cardiovascular negative cardio ROS Normal cardiovascular exam     Neuro/Psych  PSYCHIATRIC DISORDERS Anxiety Depression    negative neurological ROS     GI/Hepatic negative GI ROS,,,  Endo/Other    Morbid obesity Pre-DM   Renal/GU negative Renal ROS     Musculoskeletal negative musculoskeletal ROS (+)  narcotic dependent  Abdominal   Peds  Hematology negative hematology ROS (+)   Anesthesia Other Findings On GLP-1 agonist, last use over 1 week ago    Reproductive/Obstetrics  Breast cancer                              Anesthesia Physical Anesthesia Plan  ASA: 3  Anesthesia Plan: General   Post-op Pain Management: Tylenol PO (pre-op)*, Regional block* and Celebrex PO (pre-op)*   Induction: Intravenous  PONV Risk Score and Plan: 3 and Treatment may vary due to age or medical condition, Ondansetron, Dexamethasone and Midazolam  Airway Management Planned: LMA  Additional Equipment: None  Intra-op Plan:   Post-operative Plan: Extubation in OR  Informed Consent: I have reviewed the patients History and Physical, chart, labs and discussed the procedure including the risks, benefits and alternatives for the proposed anesthesia with the patient or authorized representative who has indicated his/her understanding and acceptance.     Dental advisory given  Plan Discussed with: CRNA and  Anesthesiologist  Anesthesia Plan Comments:        Anesthesia Quick Evaluation

## 2022-09-14 NOTE — Discharge Instructions (Addendum)
Post Anesthesia Home Care Instructions  Activity: Get plenty of rest for the remainder of the day. A responsible individual must stay with you for 24 hours following the procedure.  For the next 24 hours, DO NOT: -Drive a car -Paediatric nurse -Drink alcoholic beverages -Take any medication unless instructed by your physician -Make any legal decisions or sign important papers.  Meals: Start with liquid foods such as gelatin or soup. Progress to regular foods as tolerated. Avoid greasy, spicy, heavy foods. If nausea and/or vomiting occur, drink only clear liquids until the nausea and/or vomiting subsides. Call your physician if vomiting continues.  Special Instructions/Symptoms: Your throat may feel dry or sore from the anesthesia or the breathing tube placed in your throat during surgery. If this causes discomfort, gargle with warm salt water. The discomfort should disappear within 24 hours.  Tylenol can be taken after 4:30 Ibuprofen can be taken after 6:30      Regional Anesthesia Blocks  1. Numbness or the inability to move the "blocked" extremity may last from 3-48 hours after placement. The length of time depends on the medication injected and your individual response to the medication. If the numbness is not going away after 48 hours, call your surgeon.  2. The extremity that is blocked will need to be protected until the numbness is gone and the  Strength has returned. Because you cannot feel it, you will need to take extra care to avoid injury. Because it may be weak, you may have difficulty moving it or using it. You may not know what position it is in without looking at it while the block is in effect.  3. For blocks in the legs and feet, returning to weight bearing and walking needs to be done carefully. You will need to wait until the numbness is entirely gone and the strength has returned. You should be able to move your leg and foot normally before you try and bear weight  or walk. You will need someone to be with you when you first try to ensure you do not fall and possibly risk injury.  4. Bruising and tenderness at the needle site are common side effects and will resolve in a few days.  5. Persistent numbness or new problems with movement should be communicated to the surgeon or the [place] [phone] [place] [phone]).  Information for Discharge Teaching: EXPAREL (bupivacaine liposome injectable suspension)   Your surgeon or anesthesiologist gave you EXPAREL(bupivacaine) to help control your pain after surgery.  EXPAREL is a local anesthetic that provides pain relief by numbing the tissue around the surgical site. EXPAREL is designed to release pain medication over time and can control pain for up to 72 hours. Depending on how you respond to EXPAREL, you may require less pain medication during your recovery.  Possible side effects: Temporary loss of sensation or ability to move in the area where bupivacaine was injected. Nausea, vomiting, constipation Rarely, numbness and tingling in your mouth or lips, lightheadedness, or anxiety may occur. Call your doctor right away if you think you may be experiencing any of these sensations, or if you have other questions regarding possible side effects.  Follow all other discharge instructions given to you by your surgeon or nurse. Eat a healthy diet and drink plenty of water or other fluids.  If you return to the hospital for any reason within 96 hours following the administration of EXPAREL, it is important for health care providers to  know that you have received this anesthetic. A teal colored band has been placed on your arm with the date, time and amount of EXPAREL you have received in order to alert and inform your health care providers. Please leave this armband in place for the full 96 hours following administration, and then you may remove the band.

## 2022-09-14 NOTE — Progress Notes (Signed)
Assisted Dr. Brock with left, pectoralis, ultrasound guided block. Side rails up, monitors on throughout procedure. See vital signs in flow sheet. Tolerated Procedure well. 

## 2022-09-14 NOTE — Progress Notes (Signed)
Nuc med at bedside to inject left breast.  Pt tolerated well, VSS.

## 2022-09-14 NOTE — Interval H&P Note (Signed)
History and Physical Interval Note:  09/14/2022 11:32 AM  Kelli Estrada  has presented today for surgery, with the diagnosis of LEFT BREAST CANCER.  The various methods of treatment have been discussed with the patient and family. After consideration of risks, benefits and other options for treatment, the patient has consented to  Procedure(s): LEFT BREAST CENTRAL LUMPECTOMY WITH SENTINEL LYMPH NODE BX (Left) as a surgical intervention.  The patient's history has been reviewed, patient examined, no change in status, stable for surgery.  I have reviewed the patient's chart and labs.  Questions were answered to the patient's satisfaction.     Autumn Messing III

## 2022-09-14 NOTE — H&P (Signed)
REFERRING PHYSICIAN: Mel Almond*  PROVIDER: Landry Corporal, MD  MRN: [id number] DOB: [birth year] Subjective   Chief Complaint: Breast Cancer   History of Present Illness: Kelli Estrada is a 61 y.o. female who is seen today as an office consultation for evaluation of Breast Cancer .   We are asked to see the patient in consultation by Dr.Iruku to evaluate her for a new left breast cancer. The patient is a 61 year old white female who recently went for a routine screening mammogram. At that time she was found to have a 1.9 cm mass in the retroareolar left breast. The lymph nodes looked normal. The mass was biopsied and came back as a grade 2 invasive ductal cancer that was ER and PR positive and HER2 negative with a Ki-67 of 10%. She does have morbid obesity and obstructive sleep apnea. She does not smoke. She has a family history of breast cancer in a maternal grandmother.  Review of Systems: A complete review of systems was obtained from the patient. I have reviewed this information and discussed as appropriate with the patient. See HPI as well for other ROS.  Review of Systems  Constitutional: Negative.  HENT: Negative.  Eyes: Negative.  Respiratory: Negative.  Cardiovascular: Negative.  Gastrointestinal: Negative.  Genitourinary: Negative.  Musculoskeletal: Negative.  Skin: Negative.  Neurological: Negative.  Endo/Heme/Allergies: Negative.  Psychiatric/Behavioral: Negative.    Medical History: Past Medical History:  Diagnosis Date  Anxiety  Arthritis  Diabetes mellitus without complication (CMS-HCC)  GERD (gastroesophageal reflux disease)  Sleep apnea   Patient Active Problem List  Diagnosis  Chronic pain of left knee  Malignant neoplasm of central portion of left breast in female, estrogen receptor positive  Obstructive sleep apnea  PTSD (post-traumatic stress disorder)  Morbid obesity with body mass index of 50 or higher (CMS-HCC)  Depression, major,  recurrent, moderate (CMS-HCC)  Rash  Ringing in the ears, unspecified laterality  Heartburn  Neck pain  Nervousness  Anxiousness  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, estrogen receptor positive   Past Surgical History:  Procedure Laterality Date  neck surgery N/A  unknown date    No Known Allergies  Current Outpatient Medications on File Prior to Visit  Medication Sig Dispense Refill  albuterol 90 mcg/actuation inhaler Inhale 1 Puff into the lungs every 6 (six) hours as needed for Wheezing or Shortness of Breath  alendronate (FOSAMAX) 70 MG tablet Take 70 mg by mouth every 7 (seven) days  amoxicillin-clavulanate (AUGMENTIN) 875-125 mg tablet Take 1 tablet by mouth every 12 (twelve) hours  gabapentin (NEURONTIN) 600 MG tablet Take 600 mg by mouth 3 (three) times daily  metFORMIN (GLUCOPHAGE) 500 MG tablet Take 500 mg by mouth every evening  mirabegron (MYRBETRIQ) 25 mg ER Tablet Take 1 tablet by mouth once daily  omeprazole (PRILOSEC) 20 MG DR capsule Take 20 mg by mouth every morning  rosuvastatin (CRESTOR) 20 MG tablet Take 20 mg by mouth at bedtime  tamsulosin (FLOMAX) 0.4 mg capsule Take 1 capsule by mouth once daily  topiramate (TOPAMAX) 100 MG tablet Take 100 mg by mouth once daily  vitamin E 400 unit capsule Take 2 capsules by mouth once daily  fenofibrate nanocrystallized (TRICOR) 48 MG tablet Take 40 mg by mouth once daily  TRELEGY ELLIPTA 100-62.5-25 mcg inhaler Inhale 1 Puff into the lungs once daily   No current facility-administered medications on file prior to visit.   Family History  Problem Relation Age of  Onset  Obesity Father    Social History   Tobacco Use  Smoking Status Never  Smokeless Tobacco Never    Social History   Socioeconomic History  Marital status: Single  Tobacco Use  Smoking status: Never  Smokeless tobacco: Never  Vaping Use  Vaping Use: Never used  Substance and Sexual Activity  Alcohol  use: Never  Drug use: Never  Sexual activity: Defer   Objective:  There were no vitals filed for this visit.  There is no height or weight on file to calculate BMI.  Physical Exam Vitals reviewed.  Constitutional:  General: She is not in acute distress. Appearance: Normal appearance.  HENT:  Head: Normocephalic and atraumatic.  Right Ear: External ear normal.  Left Ear: External ear normal.  Nose: Nose normal.  Mouth/Throat:  Mouth: Mucous membranes are moist.  Pharynx: Oropharynx is clear.  Eyes:  General: No scleral icterus. Extraocular Movements: Extraocular movements intact.  Conjunctiva/sclera: Conjunctivae normal.  Pupils: Pupils are equal, round, and reactive to light.  Cardiovascular:  Rate and Rhythm: Normal rate and regular rhythm.  Pulses: Normal pulses.  Heart sounds: Normal heart sounds.  Pulmonary:  Effort: Pulmonary effort is normal. No respiratory distress.  Breath sounds: Normal breath sounds.  Abdominal:  General: Bowel sounds are normal.  Palpations: Abdomen is soft.  Tenderness: There is no abdominal tenderness.  Musculoskeletal:  General: No swelling, tenderness or deformity. Normal range of motion.  Cervical back: Normal range of motion and neck supple.  Skin: General: Skin is warm and dry.  Coloration: Skin is not jaundiced.  Neurological:  General: No focal deficit present.  Mental Status: She is alert and oriented to person, place, and time.  Psychiatric:  Mood and Affect: Mood normal.  Behavior: Behavior normal.     Breast: There is a 2 cm palpable mass in the outer subareolar left breast very close to the nipple. There is no palpable mass in the right breast. There is no palpable axillary, supraclavicular, or cervical lymphadenopathy.  Labs, Imaging and Diagnostic Testing:  Assessment and Plan:   Diagnoses and all orders for this visit:  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, estrogen  receptor positive  - [facility] Case Posting Request; Future    The patient appears to have a 1.9 cm cancer in the subareolar left breast with clinically negative nodes and favorable markers. I have discussed with her in detail the different options for treatment and at this point she favors breast conservation which I feel is very reasonable. She would need a central lumpectomy for this. She is also a good candidate for sentinel node biopsy as well. I have discussed with her in detail the risks and benefits of the operation as well as some of the technical aspects and she understands and wishes to proceed. She will meet with medical and radiation oncology to discuss adjuvant therapy as well as physical therapy for preoperative lymphedema testing. We will begin surgical planning.

## 2022-09-14 NOTE — Anesthesia Procedure Notes (Signed)
Procedure Name: LMA Insertion Date/Time: 09/14/2022 12:03 PM  Performed by: Willa Frater, CRNAPre-anesthesia Checklist: Patient identified, Emergency Drugs available, Suction available and Patient being monitored Patient Re-evaluated:Patient Re-evaluated prior to induction Oxygen Delivery Method: Circle system utilized Preoxygenation: Pre-oxygenation with 100% oxygen Induction Type: IV induction Ventilation: Mask ventilation without difficulty LMA: LMA inserted LMA Size: 4.0 Number of attempts: 1 Airway Equipment and Method: Bite block Placement Confirmation: positive ETCO2 Tube secured with: Tape Dental Injury: Teeth and Oropharynx as per pre-operative assessment

## 2022-09-14 NOTE — Anesthesia Postprocedure Evaluation (Signed)
Anesthesia Post Note  Patient: Kelli Estrada  Procedure(s) Performed: LEFT BREAST CENTRAL LUMPECTOMY WITH SENTINEL LYMPH NODE BX (Left: Breast)     Patient location during evaluation: PACU Anesthesia Type: General Level of consciousness: awake and alert Pain management: pain level controlled Vital Signs Assessment: post-procedure vital signs reviewed and stable Respiratory status: spontaneous breathing, nonlabored ventilation and respiratory function stable Cardiovascular status: stable and blood pressure returned to baseline Anesthetic complications: no   No notable events documented.  Last Vitals:  Vitals:   09/14/22 1400 09/14/22 1413  BP: 114/71 (!) 132/98  Pulse: 71 64  Resp: 18 18  Temp:  37.2 C  SpO2: 98% 99%    Last Pain:  Vitals:   09/14/22 1417  TempSrc:   PainSc: [place]

## 2022-09-14 NOTE — Transfer of Care (Signed)
Immediate Anesthesia Transfer of Care Note  Patient: Kelli Estrada  Procedure(s) Performed: LEFT BREAST CENTRAL LUMPECTOMY WITH SENTINEL LYMPH NODE BX (Left: Breast)  Patient Location: PACU  Anesthesia Type:General  Level of Consciousness: awake, alert , and oriented  Airway & Oxygen Therapy: Patient Spontanous Breathing and Patient connected to face mask oxygen  Post-op Assessment: Report given to RN and Post -op Vital signs reviewed and stable  Post vital signs: Reviewed and stable  Last Vitals:  Vitals Value Taken Time  BP    Temp    Pulse    Resp    SpO2      Last Pain:  Vitals:   09/14/22 1031  TempSrc: Oral  PainSc: 5       Patients Stated Pain Goal: 5 (01/77/93 9030)  Complications: No notable events documented.

## 2022-09-14 NOTE — Anesthesia Procedure Notes (Signed)
Anesthesia Regional Block: Pectoralis block   Pre-Anesthetic Checklist: , timeout performed,  Correct Patient, Correct Site, Correct Laterality,  Correct Procedure, Correct Position, site marked,  Risks and benefits discussed,  Surgical consent,  Pre-op evaluation,  At surgeon's request and post-op pain management  Laterality: Left  Prep: chloraprep       Needles:  Injection technique: Single-shot  Needle Type: Echogenic Needle     Needle Length: 10cm  Needle Gauge: 21     Additional Needles:   Narrative:  Start time: 09/14/2022 11:10 AM End time: 09/14/2022 11:14 AM Injection made incrementally with aspirations every 5 mL.  Performed by: Personally  Anesthesiologist: Audry Pili, MD  Additional Notes: No pain on injection. No increased resistance to injection. Injection made in 5cc increments. Good needle visualization. Patient tolerated the procedure well.

## 2022-09-14 NOTE — Op Note (Signed)
09/14/2022  1:11 PM  PATIENT:  Kelli Estrada  61 y.o. female  PRE-OPERATIVE DIAGNOSIS:  LEFT BREAST CANCER  POST-OPERATIVE DIAGNOSIS:  LEFT BREAST CANCER  PROCEDURE:  Procedure(s): LEFT BREAST CENTRAL LUMPECTOMY WITH SENTINEL LYMPH NODE BX (Left)  SURGEON:  Surgeon(s) and Role:    * Jovita Kussmaul, MD - Primary  PHYSICIAN ASSISTANT:   ASSISTANTS: none   ANESTHESIA:   local and general  EBL:  minimal   BLOOD ADMINISTERED:none  DRAINS: none   LOCAL MEDICATIONS USED:  MARCAINE     SPECIMEN:  Source of Specimen:  left breast tissue and sentinel node x 2  DISPOSITION OF SPECIMEN:  PATHOLOGY  COUNTS:  YES  TOURNIQUET:  * No tourniquets in log *  DICTATION: .Dragon Dictation  After informed consent was obtained the patient was brought to the operating room and placed in the supine position on the operating table.  After adequate induction of general anesthesia the patient's left breast and chest and axilla were prepped with ChloraPrep, allowed to dry, draped in usual sterile manner.  An appropriate timeout was performed.  Earlier in the day the patient underwent injection of 1 mCi of technetium sulfur colloid in the subareolar position on the left.  The neoprobe was set to technetium in the area of radioactivity in the left axilla was readily identified.  The area overlying this was infiltrated with quarter percent Marcaine.  A small transversely oriented incision was made with a 15 blade knife overlying the area of radioactivity.  The incision was then carried through the skin and subcutaneous tissue sharply with electrocautery until the deep left axillary space was entered.  I was able to identify 2 lymph nodes with radioactive signal.  These were excised sharply as 1 specimen with the electrocautery and the surrounding small vessels and lymphatics were controlled with clips.  No other hot or palpable nodes were identified in the left axilla.  These nodes were sent as sentinel node  #1.  Hemostasis was achieved using the Bovie electrocautery.  The deep layer of the axilla was then closed with layers of interrupted 3-0 Vicryl stitches.  The skin was then closed with a running 4-0 Monocryl subcuticular stitch.  Attention was then turned to the left breast.  The patient had a palpable cancer in the outer subareolar left breast.  An elliptical incision was made around the nipple and areola complex and around the palpable mass with a 15 blade knife.  The incision was carried through the skin and subcutaneous tissue sharply with electrocautery.  Dissection was then carried widely around the palpable mass sharply with electrocautery.  Once the entire mass was removed it was oriented with the appropriate pink colors.  A specimen radiograph was obtained that showed the clip to be within the specimen.  The specimen was then sent to pathology for further evaluation.  Hemostasis was achieved using the Bovie electrocautery.  The wound was irrigated with saline and infiltrated with more quarter percent Marcaine.  The deep layer of the incision was then closed with layers of interrupted 3-0 Vicryl stitches.  The skin was then closed with a running 4-0 Monocryl subcuticular stitch.  Dermabond dressings were applied.  The patient tolerated procedure well.  At the end of the case all needle sponge and instrument counts were correct.  The patient was then awakened and taken to recovery in stable condition.  PLAN OF CARE: Discharge to home after PACU  PATIENT DISPOSITION:  PACU - hemodynamically stable.   Delay  start of Pharmacological VTE agent (>24hrs) due to surgical blood loss or risk of bleeding: not applicable

## 2022-09-15 ENCOUNTER — Encounter (HOSPITAL_BASED_OUTPATIENT_CLINIC_OR_DEPARTMENT_OTHER): Payer: Self-pay | Admitting: General Surgery

## 2022-09-17 LAB — SURGICAL PATHOLOGY

## 2022-09-19 ENCOUNTER — Ambulatory Visit (INDEPENDENT_AMBULATORY_CARE_PROVIDER_SITE_OTHER): Payer: Medicare Other | Admitting: Urology

## 2022-09-19 ENCOUNTER — Encounter: Payer: Self-pay | Admitting: *Deleted

## 2022-09-19 ENCOUNTER — Telehealth: Payer: Self-pay | Admitting: *Deleted

## 2022-09-19 VITALS — BP 127/85 | HR 83

## 2022-09-19 DIAGNOSIS — N3281 Overactive bladder: Secondary | ICD-10-CM | POA: Diagnosis not present

## 2022-09-19 DIAGNOSIS — N3000 Acute cystitis without hematuria: Secondary | ICD-10-CM

## 2022-09-19 DIAGNOSIS — N200 Calculus of kidney: Secondary | ICD-10-CM

## 2022-09-19 LAB — URINALYSIS, ROUTINE W REFLEX MICROSCOPIC
Bilirubin, UA: NEGATIVE
Glucose, UA: NEGATIVE
Ketones, UA: NEGATIVE
Nitrite, UA: POSITIVE — AB
Protein,UA: NEGATIVE
Specific Gravity, UA: 1.015 (ref 1.005–1.030)
Urobilinogen, Ur: 2 mg/dL — ABNORMAL HIGH (ref 0.2–1.0)
pH, UA: 7 (ref 5.0–7.5)

## 2022-09-19 LAB — MICROSCOPIC EXAMINATION

## 2022-09-19 LAB — BLADDER SCAN AMB NON-IMAGING: Scan Result: 16

## 2022-09-19 MED ORDER — MIRABEGRON ER 25 MG PO TB24: 25.0000 mg | ORAL_TABLET | Freq: Every day | ORAL | 11 refills | Status: DC

## 2022-09-19 NOTE — Progress Notes (Signed)
post void residual=16 

## 2022-09-19 NOTE — Progress Notes (Signed)
09/19/2022 2:21 PM   Kelli Estrada 03-Sep-1961 [id number]  Referring provider: Sherald Estrada., MD [place],  [place] [address]  Followup nephrolithiasis and OAB   HPI: Ms Kelli Estrada is a 61yo here for followup for nephrolithiasis and OAB. Renal US 06/2022 shows a left 69m renal calculus. No left flank pain. She is doing well on mirabegron. Urinary urgency and frequency have significantly improved. No other complaints today   PMH: Past Medical History:  Diagnosis Date   Anxiety    Chronic pain    Depression    Family history of breast cancer 08/23/2022   H/O degenerative disc disease    Pre-diabetes    PTSD (post-traumatic stress disorder)    Sleep apnea     Surgical History: Past Surgical History:  Procedure Laterality Date   ABDOMINAL HYSTERECTOMY     BREAST LUMPECTOMY WITH SENTINEL LYMPH NODE BIOPSY Left 09/14/2022   Procedure: LEFT BREAST CENTRAL LUMPECTOMY WITH SENTINEL LYMPH NODE BX;  Surgeon: Kelli Kussmaul MD;  [place] [facility]  Service: General;  Laterality: Left;   COLONOSCOPY WITH PROPOFOL N/A 03/15/2016   Procedure: COLONOSCOPY WITH PROPOFOL;  Surgeon: JTeena Irani MD;  Location: [facility];  Service: Endoscopy;  Laterality: N/A;   NECK SURGERY      Home Medications:  Allergies as of 09/19/2022   No Known Allergies      Medication List        Accurate as of September 19, 2022  2:21 PM. If you have any questions, ask your nurse or doctor.          albuterol 108 (90 Base) MCG/ACT inhaler Commonly known as: VENTOLIN HFA SMARTSIG:1 Puff(s) Via Inhaler 4 Times Daily PRN   alendronate 70 MG tablet Commonly known as: FOSAMAX Take 70 mg by mouth once a week.   amoxicillin-clavulanate 875-125 MG tablet Commonly known as: AUGMENTIN Take 1 tablet by mouth every 12 (twelve) hours.   CVS Calcium 600 & Vitamin D3 600-20 MG-MCG Tabs Generic drug: Calcium Carb-Cholecalciferol Take 1 tablet by mouth daily.   CVS Vitamin E  180 MG (400 UNIT) Caps Generic drug: Vitamin E Take 2 capsules by mouth daily.   fenofibrate 48 MG tablet Commonly known as: TRICOR Take 40 mg by mouth daily.   gabapentin 300 MG capsule Commonly known as: NEURONTIN Take 300 mg by mouth 3 (three) times daily.   hydrOXYzine 25 MG capsule Commonly known as: VISTARIL Take 25 mg by mouth 2 (two) times daily as needed for anxiety.   metFORMIN 500 MG tablet Commonly known as: GLUCOPHAGE SMARTSIG:1 Tablet(s) By Mouth Every Evening   mirabegron ER 25 MG Tb24 tablet Commonly known as: MYRBETRIQ Take 1 tablet (25 mg total) by mouth daily.   omeprazole 20 MG tablet Commonly known as: PRILOSEC OTC Take 20 mg by mouth daily.   ondansetron 4 MG disintegrating tablet Commonly known as: Zofran ODT Take 1 tablet (4 mg total) by mouth every 8 (eight) hours as needed for nausea or vomiting.   OneTouch Verio Reflect w/Device Kit See admin instructions.   Oxcarbazepine 300 MG tablet Commonly known as: TRILEPTAL Take 300 mg by mouth 2 (two) times daily.   oxyCODONE 5 MG immediate release tablet Commonly known as: Roxicodone Take 1 tablet (5 mg total) by mouth every 6 (six) hours as needed for severe pain.   oxyCODONE-acetaminophen 5-325 MG tablet Commonly known as: PERCOCET/ROXICET Take 1 tablet by mouth every 6 (six) hours as needed (For pain.).  Ozempic (1 MG/DOSE) 4 MG/3ML Sopn Generic drug: Semaglutide (1 MG/DOSE) Inject 1 mg into the skin once a week.   rosuvastatin 20 MG tablet Commonly known as: CRESTOR Take 20 mg by mouth at bedtime.   sertraline 100 MG tablet Commonly known as: ZOLOFT Take 100 mg by mouth 2 (two) times daily.   tamsulosin 0.4 MG Caps capsule Commonly known as: FLOMAX TAKE 1 CAPSULE BY MOUTH EVERY DAY   topiramate 100 MG tablet Commonly known as: TOPAMAX Take 100 mg by mouth daily.   Trelegy Ellipta 100-62.5-25 MCG/ACT Aepb Generic drug: Fluticasone-Umeclidin-Vilant Inhale 100 mcg into the  lungs 1 day or 1 dose.        Allergies: No Known Allergies  Family History: Family History  Problem Relation Age of Onset   Lung cancer Mother        dx > 27   Breast cancer Maternal Grandmother 65    Social History:  reports that she has never smoked. She does not have any smokeless tobacco history on file. She reports current alcohol use. She reports that she does not use drugs.  ROS: All other review of systems were reviewed and are negative except what is noted above in HPI  Physical Exam: BP 127/85   Pulse 83   Constitutional:  Alert and oriented, No acute distress. HEENT: Kelli Estrada AT, moist mucus membranes.  Trachea midline, no masses. Cardiovascular: No clubbing, cyanosis, or edema. Respiratory: Normal respiratory effort, no increased work of breathing. GI: Abdomen is soft, nontender, nondistended, no abdominal masses GU: No CVA tenderness.  Lymph: No cervical or inguinal lymphadenopathy. Skin: No rashes, bruises or suspicious lesions. Neurologic: Grossly intact, no focal deficits, moving all 4 extremities. Psychiatric: Normal mood and affect.  Laboratory Data: Lab Results  Component Value Date   WBC 4.7 08/22/2022   HGB 13.5 08/22/2022   HCT 40.1 08/22/2022   MCV 87.6 08/22/2022   PLT 157 08/22/2022    Lab Results  Component Value Date   CREATININE 0.71 08/22/2022    No results found for: "PSA"  No results found for: "TESTOSTERONE"  No results found for: "HGBA1C"  Urinalysis    Component Value Date/Time   COLORURINE YELLOW 05/28/2022 0419   APPEARANCEUR Cloudy (A) 08/20/2022 1105   LABSPEC 1.023 05/28/2022 0419   PHURINE 5.0 05/28/2022 0419   GLUCOSEU Negative 08/20/2022 1105   HGBUR MODERATE (A) 05/28/2022 0419   BILIRUBINUR Negative 08/20/2022 1105   KETONESUR NEGATIVE 05/28/2022 0419   PROTEINUR Negative 08/20/2022 1105   PROTEINUR 30 (A) 05/28/2022 0419   NITRITE Positive (A) 08/20/2022 1105   NITRITE NEGATIVE 05/28/2022 0419    LEUKOCYTESUR Trace (A) 08/20/2022 1105   LEUKOCYTESUR TRACE (A) 05/28/2022 0419    Lab Results  Component Value Date   LABMICR See below: 08/20/2022   WBCUA 11-30 (A) 08/20/2022   LABEPIT 0-10 08/20/2022   BACTERIA Many (A) 08/20/2022    Pertinent Imaging: Renal US 06/2022: Images reviewed and discussed with the patient  Results for orders placed during the hospital encounter of 05/30/22  DG Abd 1 View  Narrative CLINICAL DATA:  Left flank pain.  EXAM: ABDOMEN - 1 VIEW  COMPARISON:  None Available.  FINDINGS: The bowel gas pattern is normal. A moderate to large stool burden is seen involving the ascending and distal transverse colon. A 5 mm soft tissue calcification is seen overlying the medial aspect of the mid left abdomen.  IMPRESSION: 1. Suspected 5 mm left ureteral calculus.   Electronically Signed By:  Virgina Norfolk M.D. On: 05/31/2022 23:55  No results found for this or any previous visit.  No results found for this or any previous visit.  No results found for this or any previous visit.  Results for orders placed during the hospital encounter of 07/05/22  Ultrasound renal complete  Narrative CLINICAL DATA:  Follow-up left renal calculi.  EXAM: RENAL / URINARY TRACT ULTRASOUND COMPLETE  COMPARISON:  CT scan of the abdomen and pelvis May 28, 2022.  FINDINGS: Right Kidney:  Renal measurements: 13.7 x 4.9 x 5.4 cm = volume: 186 mL. Echogenicity within normal limits. No mass or hydronephrosis visualized.  Left Kidney:  Renal measurements: 30.3 x 4.9 x 6.2 cm = volume: 212 mL. Contains a 1.4 cm cyst. No follow-up imaging recommended for the cyst. Contains an 8 mm nonobstructive stone.  Bladder:  Appears normal for degree of bladder distention.  Other:  None.  IMPRESSION: 1. There is an 8 mm nonobstructive stone in the left kidney. 2. The kidneys are otherwise unremarkable. 3. The bladder is normal.   Electronically  Signed By: Dorise Bullion III M.D. On: 07/05/2022 16:50  No valid procedures specified. No results found for this or any previous visit.  Results for orders placed during the hospital encounter of 05/28/22  CT RENAL STONE STUDY  Narrative CLINICAL DATA:  Left lower quadrant pain.  EXAM: CT ABDOMEN AND PELVIS WITHOUT CONTRAST  TECHNIQUE: Multidetector CT imaging of the abdomen and pelvis was performed following the standard protocol without IV contrast.  RADIATION DOSE REDUCTION: This exam was performed according to the departmental dose-optimization program which includes automated exposure control, adjustment of the mA and/or kV according to patient size and/or use of iterative reconstruction technique.  COMPARISON:  April 29, 2018  FINDINGS: Lower chest: No acute abnormality.  Hepatobiliary: No focal liver abnormality is seen. No gallstones, gallbladder wall thickening, or biliary dilatation.  Pancreas: Unremarkable. No pancreatic ductal dilatation or surrounding inflammatory changes.  Spleen: Normal in size without focal abnormality.  Adrenals/Urinary Tract: Adrenal glands are unremarkable. Kidneys are normal in size, without focal lesions. A 3 mm nonobstructing renal calculus is seen within the posterior aspect of the mid to lower left kidney. An additional 3 mm obstructing renal calculus is seen at the left UVJ. Mild to moderate severity left-sided hydronephrosis and hydroureter are seen. The urinary bladder is poorly distended and subsequently limited in evaluation.  Stomach/Bowel: Stomach is within normal limits. The appendix is not clearly identified. No evidence of bowel wall thickening, distention, or inflammatory changes. Noninflamed diverticula are seen throughout the sigmoid colon.  Vascular/Lymphatic: No significant vascular findings are present. No enlarged abdominal or pelvic lymph nodes.  Reproductive: Status post hysterectomy. No adnexal  masses.  Other: No abdominal wall hernia or abnormality. No abdominopelvic ascites.  Musculoskeletal: No acute or significant osseous findings.  IMPRESSION: 1. 3 mm obstructing renal calculus at the left UVJ. 2. 3 mm nonobstructing left renal calculus. 3. Sigmoid diverticulosis.   Electronically Signed By: Virgina Norfolk M.D. On: 05/28/2022 05:03   Assessment & Plan:    1. OAB (overactive bladder) -Continue mirabegron 86m daily - Urinalysis, Routine w reflex microscopic - BLADDER SCAN AMB NON-IMAGING  2. Kidney stones -RTC 3 months with KUB   No follow-ups on file.  PNicolette Bang MD  [facility] [place]

## 2022-09-19 NOTE — Patient Instructions (Signed)

## 2022-09-19 NOTE — Telephone Encounter (Signed)
Ordered oncotype per Dr. Chryl Heck. Requisition sent to pathology and exact sciences.

## 2022-09-24 LAB — URINE CULTURE

## 2022-09-25 ENCOUNTER — Encounter: Payer: Self-pay | Admitting: Urology

## 2022-09-26 ENCOUNTER — Other Ambulatory Visit: Payer: Self-pay

## 2022-09-26 ENCOUNTER — Inpatient Hospital Stay: Payer: Medicare Other | Attending: Hematology and Oncology | Admitting: Hematology and Oncology

## 2022-09-26 ENCOUNTER — Telehealth: Payer: Self-pay

## 2022-09-26 DIAGNOSIS — Z801 Family history of malignant neoplasm of trachea, bronchus and lung: Secondary | ICD-10-CM | POA: Insufficient documentation

## 2022-09-26 DIAGNOSIS — C50112 Malignant neoplasm of central portion of left female breast: Secondary | ICD-10-CM | POA: Insufficient documentation

## 2022-09-26 DIAGNOSIS — Z90710 Acquired absence of both cervix and uterus: Secondary | ICD-10-CM | POA: Insufficient documentation

## 2022-09-26 DIAGNOSIS — Z803 Family history of malignant neoplasm of breast: Secondary | ICD-10-CM | POA: Insufficient documentation

## 2022-09-26 DIAGNOSIS — Z170 Estrogen receptor positive status [ER+]: Secondary | ICD-10-CM | POA: Diagnosis not present

## 2022-09-26 DIAGNOSIS — N3000 Acute cystitis without hematuria: Secondary | ICD-10-CM

## 2022-09-26 NOTE — Telephone Encounter (Signed)
Made patient aware that a rx for Macrobid '100mg'$  bid for 7 days was sent to her pharmacy due to positive urine culture. Patient voiced understanding

## 2022-09-26 NOTE — Assessment & Plan Note (Signed)
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   Given early stage, strong ER/PR positivity and no evidence of lymph node involvement we have discussed about upfront surgery followed by Oncotype testing. We have discussed final pathology which showed 1 lymph node involvement malignancy. Oncotype has been sent, awaiting results.  If she does not need adjuvant chemotherapy, then she can proceed with adjuvant radiation followed by antiestrogen therapy with abemaciclib.  Have clearly explained the role of abemaciclib and antiestrogen therapy today.  She is hoping and praying that she will not need chemotherapy. Will try to call her once we have the Oncotype results.  Thank you for consulting [place] in the care of this patient.  Please do not hesitate to contact us with any additional questions or concerns

## 2022-09-26 NOTE — Telephone Encounter (Signed)
Tried calling patient with no answer.

## 2022-09-26 NOTE — Progress Notes (Signed)
[place] NOTE  Patient Care Team: Sherald Hess., MD as PCP - General (Family Medicine) Benay Pike, MD as Consulting Physician (Hematology and Oncology) Jovita Kussmaul, MD as Consulting Physician (General Surgery) Gery Pray, MD as Consulting Physician (Radiation Oncology) Mauro Kaufmann, RN as Oncology Nurse Navigator Rockwell Germany, RN as Oncology Nurse Navigator  CHIEF COMPLAINTS/PURPOSE OF CONSULTATION:  Newly diagnosed breast cancer  HISTORY OF PRESENTING ILLNESS:  Kelli Estrada 61 y.o. female is here because of recent diagnosis of left breast cancer  I reviewed her records extensively and collaborated the history with the patient.  SUMMARY OF ONCOLOGIC HISTORY: Oncology History  Malignant neoplasm of central portion of left breast in female, estrogen receptor positive ([place])  07/18/2022 Imaging   Mammogram showed indeterminate left breast mass central to the nipple in the retroareolar region, indeterminate lymph node.  Ultrasound done showed 1.9 x 1.4 x 1.5 cm taller than wide irregular mass in the left breast highly suggestive of malignancy.  No significant abnormality seen in the left axilla   08/16/2022 Pathology Results   Pathology from the left breast mass showed grade 2 invasive ductal carcinoma ER 100% positive strong staining PR 90% positive strong staining Ki-67 of 10% and HER2 1+ by [facility]   08/20/2022 Initial Diagnosis   Malignant neoplasm of central portion of left breast in female, estrogen receptor positive ([place])   08/27/2022 Genetic Testing   Negative CustomNext-Cancer +RNAinsight Panel.  Report date is 08/29/2022.   The CustomNext-Cancer+RNAinsight panel offered by [facility] includes sequencing and rearrangement analysis for the following 47 genes:  APC, ATM, AXIN2, BARD1, BMPR1A, BRCA1, BRCA2, BRIP1, CDH1, CDK4, CDKN2A, CHEK2, DICER1, EPCAM, GREM1, HOXB13, MEN1, MLH1, MSH2, MSH3, MSH6, MUTYH, NBN, NF1, NF2, NTHL1, PALB2,  PMS2, POLD1, POLE, PTEN, RAD51C, RAD51D, RECQL, RET, SDHA, SDHAF2, SDHB, SDHC, SDHD, SMAD4, SMARCA4, STK11, TP53, TSC1, TSC2, and VHL.  RNA data is routinely analyzed for use in variant interpretation for all genes.    Interval History  Kelli Estrada is here for an initial visit with her daughter.  She tells me that she did not feel this mass.  When she went for her regular visit, she had a breast exam and then noticed something hard in her left breast and hence recommended further evaluation.  She had a mammogram last year according to her which was unremarkable.  At baseline she has chronic pain issues and takes pain medication.  Her pain is secondary to degenerative disc disease.  She also has some underlying anxiety and depression.  Rest of the pertinent 10 point ROS reviewed and negative  MEDICAL HISTORY:  Past Medical History:  Diagnosis Date   Anxiety    Chronic pain    Depression    Family history of breast cancer 08/23/2022   H/O degenerative disc disease    Pre-diabetes    PTSD (post-traumatic stress disorder)    Sleep apnea     SURGICAL HISTORY: Past Surgical History:  Procedure Laterality Date   ABDOMINAL HYSTERECTOMY     BREAST LUMPECTOMY WITH SENTINEL LYMPH NODE BIOPSY Left 09/14/2022   Procedure: LEFT BREAST CENTRAL LUMPECTOMY WITH SENTINEL LYMPH NODE BX;  Surgeon: Jovita Kussmaul, MD;  Location: [facility];  Service: General;  Laterality: Left;   COLONOSCOPY WITH PROPOFOL N/A 03/15/2016   Procedure: COLONOSCOPY WITH PROPOFOL;  Surgeon: Teena Irani, MD;  Location[facility];  Service: Endoscopy;  Laterality: N/A;   NECK SURGERY      SOCIAL  HISTORY: Social History   Socioeconomic History   Marital status: Single    Spouse name: Not on file   Number of children: Not on file   Years of education: Not on file   Highest education level: Not on file  Occupational History   Not on file  Tobacco Use   Smoking status: Never   Smokeless tobacco: Not on file   Substance and Sexual Activity   Alcohol use: Yes    Comment: occasionally    Drug use: No   Sexual activity: Not on file  Other Topics Concern   Not on file  Social History Narrative   Not on file   Social Determinants of Health   Financial Resource Strain: Not on file  Food Insecurity: Not on file  Transportation Needs: Not on file  Physical Activity: Not on file  Stress: Not on file  Social Connections: Not on file  Intimate Partner Violence: Not on file    FAMILY HISTORY: Family History  Problem Relation Age of Onset   Lung cancer Mother        dx > 66   Breast cancer Maternal Grandmother 21    ALLERGIES:  has No Known Allergies.  MEDICATIONS:  Current Outpatient Medications  Medication Sig Dispense Refill   albuterol (VENTOLIN HFA) 108 (90 Base) MCG/ACT inhaler SMARTSIG:1 Puff(s) Via Inhaler 4 Times Daily PRN     alendronate (FOSAMAX) 70 MG tablet Take 70 mg by mouth once a week.     amoxicillin-clavulanate (AUGMENTIN) 875-125 MG tablet Take 1 tablet by mouth every 12 (twelve) hours. 14 tablet 0   Blood Glucose Monitoring Suppl (ONETOUCH VERIO REFLECT) w/Device KIT See admin instructions.     CVS CALCIUM 600 & VITAMIN D3 600-800 MG-UNIT TABS Take 1 tablet by mouth daily.     CVS VITAMIN E 180 MG (400 UNIT) CAPS Take 2 capsules by mouth daily.     fenofibrate (TRICOR) 48 MG tablet Take 40 mg by mouth daily.     Fluticasone-Umeclidin-Vilant (TRELEGY ELLIPTA) 100-62.5-25 MCG/ACT AEPB Inhale 100 mcg into the lungs 1 day or 1 dose.     gabapentin (NEURONTIN) 300 MG capsule Take 300 mg by mouth 3 (three) times daily.     hydrOXYzine (VISTARIL) 25 MG capsule Take 25 mg by mouth 2 (two) times daily as needed for anxiety.   0   metFORMIN (GLUCOPHAGE) 500 MG tablet SMARTSIG:1 Tablet(s) By Mouth Every Evening     mirabegron ER (MYRBETRIQ) 25 MG TB24 tablet Take 1 tablet (25 mg total) by mouth daily. 30 tablet 11   omeprazole (PRILOSEC OTC) 20 MG tablet Take 20 mg by mouth  daily.     ondansetron (ZOFRAN ODT) 4 MG disintegrating tablet Take 1 tablet (4 mg total) by mouth every 8 (eight) hours as needed for nausea or vomiting. 8 tablet 0   Oxcarbazepine (TRILEPTAL) 300 MG tablet Take 300 mg by mouth 2 (two) times daily.     oxyCODONE (ROXICODONE) 5 MG immediate release tablet Take 1 tablet (5 mg total) by mouth every 6 (six) hours as needed for severe pain. 10 tablet 0   oxyCODONE-acetaminophen (PERCOCET/ROXICET) 5-325 MG tablet Take 1 tablet by mouth every 6 (six) hours as needed (For pain.).      OZEMPIC, 1 MG/DOSE, 4 MG/3ML SOPN Inject 1 mg into the skin once a week.     rosuvastatin (CRESTOR) 20 MG tablet Take 20 mg by mouth at bedtime.     sertraline (ZOLOFT) 100 MG tablet  Take 100 mg by mouth 2 (two) times daily.  2   tamsulosin (FLOMAX) 0.4 MG CAPS capsule TAKE 1 CAPSULE BY MOUTH EVERY DAY 90 capsule 1   topiramate (TOPAMAX) 100 MG tablet Take 100 mg by mouth daily.     No current facility-administered medications for this visit.    REVIEW OF SYSTEMS:   Constitutional: Denies fevers, chills or abnormal night sweats Eyes: Denies blurriness of vision, double vision or watery eyes Ears, nose, mouth, throat, and face: Denies mucositis or sore throat Respiratory: Denies cough, dyspnea or wheezes Cardiovascular: Denies palpitation, chest discomfort or lower extremity swelling Gastrointestinal:  Denies nausea, heartburn or change in bowel habits Skin: Denies abnormal skin rashes Lymphatics: Denies new lymphadenopathy or easy bruising Neurological:Denies numbness, tingling or new weaknesses Behavioral/Psych: Mood is stable, no new changes  Breast: Denies any palpable lumps or discharge All other systems were reviewed with the patient and are negative.  PHYSICAL EXAMINATION: ECOG PERFORMANCE STATUS: 0 - Asymptomatic  There were no vitals filed for this visit.  There were no vitals filed for this visit.   GENERAL:alert, no distress and comfortable SKIN:  skin color, texture, turgor are normal, no rashes or significant lesions EYES: normal, conjunctiva are pink and non-injected, sclera clear OROPHARYNX:no exudate, no erythema and lips, buccal mucosa, and tongue normal  NECK: supple, thyroid normal size, non-tender, without nodularity LYMPH:  no palpable lymphadenopathy in the cervical, axillary or inguinal LUNGS: clear to auscultation and percussion with normal breathing effort HEART: regular rate & rhythm and no murmurs and no lower extremity edema ABDOMEN:abdomen soft, non-tender and normal bowel sounds Musculoskeletal:no cyanosis of digits and no clubbing  PSYCH: alert & oriented x 3 with fluent speech NEURO: no focal motor/sensory deficits BREAST: Bilateral breasts inspected.  Small palpable mass inferior to the nipple at 5 o'clock position measuring about a centimeter to centimeter and a half noted.  She has chronically inverted nipples.  No change.  No regional adenopathy.  LABORATORY DATA:  I have reviewed the data as listed Lab Results  Component Value Date   WBC 4.7 08/22/2022   HGB 13.5 08/22/2022   HCT 40.1 08/22/2022   MCV 87.6 08/22/2022   PLT 157 08/22/2022   Lab Results  Component Value Date   NA 141 08/22/2022   K 3.9 08/22/2022   CL 109 08/22/2022   CO2 25 08/22/2022    RADIOGRAPHIC STUDIES: I have personally reviewed the radiological reports and agreed with the findings in the report.  ASSESSMENT AND PLAN:  No problem-specific Assessment & Plan notes found for this encounter.  Total time spent: 60 minutes including history, physical exam, review of records, counseling and coordination of care All questions were answered. The patient knows to call the clinic with any problems, questions or concerns.    Benay Pike, MD 09/26/22

## 2022-10-04 ENCOUNTER — Encounter: Payer: Self-pay | Admitting: *Deleted

## 2022-10-04 ENCOUNTER — Inpatient Hospital Stay (HOSPITAL_BASED_OUTPATIENT_CLINIC_OR_DEPARTMENT_OTHER): Payer: Medicare Other | Admitting: Hematology and Oncology

## 2022-10-04 ENCOUNTER — Telehealth: Payer: Self-pay | Admitting: *Deleted

## 2022-10-04 ENCOUNTER — Encounter: Payer: Self-pay | Admitting: Hematology and Oncology

## 2022-10-04 DIAGNOSIS — Z170 Estrogen receptor positive status [ER+]: Secondary | ICD-10-CM

## 2022-10-04 DIAGNOSIS — C50112 Malignant neoplasm of central portion of left female breast: Secondary | ICD-10-CM | POA: Diagnosis not present

## 2022-10-04 NOTE — Telephone Encounter (Signed)
Received Oncotype Score of 15. Dr. Chryl Heck notified pt during [place]

## 2022-10-04 NOTE — Assessment & Plan Note (Signed)
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   Given early stage, strong ER/PR positivity and no evidence of lymph node involvement we have discussed about upfront surgery followed by Oncotype testing. We have discussed final pathology which showed 1 lymph node involvement malignancy. Oncotype resulted at 15,no benefit from chemotherapy.  We have reviewed that she should proceed with adjuvant radiation followed by antiestrogen therapy along with abemaciclib.  I have sent an in basket message to Dr. Sondra Come requesting that she be rescheduled for follow-up. She is thankful for these results.  She will return to clinic to follow-up with [place] in mid February.  Thank you for consulting [place] in the care of this patient.  Please do not hesitate to contact us with any additional questions or concerns

## 2022-10-04 NOTE — Progress Notes (Signed)
[place] NOTE  Patient Care Team: Sherald Hess., MD as PCP - General (Family Medicine) Benay Pike, MD as Consulting Physician (Hematology and Oncology) Jovita Kussmaul, MD as Consulting Physician (General Surgery) Gery Pray, MD as Consulting Physician (Radiation Oncology) Mauro Kaufmann, RN as Oncology Nurse Navigator Rockwell Germany, RN as Oncology Nurse Navigator  CHIEF COMPLAINTS/PURPOSE OF CONSULTATION:  Newly diagnosed breast cancer  HISTORY OF PRESENTING ILLNESS:  Natale Barba 61 y.o. female is here because of recent diagnosis of left breast cancer  I reviewed her records extensively and collaborated the history with the patient.  SUMMARY OF ONCOLOGIC HISTORY: Oncology History  Malignant neoplasm of central portion of left breast in female, estrogen receptor positive ([place])  07/18/2022 Imaging   Mammogram showed indeterminate left breast mass central to the nipple in the retroareolar region, indeterminate lymph node.  Ultrasound done showed 1.9 x 1.4 x 1.5 cm taller than wide irregular mass in the left breast highly suggestive of malignancy.  No significant abnormality seen in the left axilla   08/16/2022 Pathology Results   Pathology from the left breast mass showed grade 2 invasive ductal carcinoma ER 100% positive strong staining PR 90% positive strong staining Ki-67 of 10% and HER2 1+ by [facility]   08/20/2022 Initial Diagnosis   Malignant neoplasm of central portion of left breast in female, estrogen receptor positive ([place])   08/27/2022 Genetic Testing   Negative CustomNext-Cancer +RNAinsight Panel.  Report date is 08/29/2022.   The CustomNext-Cancer+RNAinsight panel offered by [facility] includes sequencing and rearrangement analysis for the following 47 genes:  APC, ATM, AXIN2, BARD1, BMPR1A, BRCA1, BRCA2, BRIP1, CDH1, CDK4, CDKN2A, CHEK2, DICER1, EPCAM, GREM1, HOXB13, MEN1, MLH1, MSH2, MSH3, MSH6, MUTYH, NBN, NF1, NF2, NTHL1, PALB2,  PMS2, POLD1, POLE, PTEN, RAD51C, RAD51D, RECQL, RET, SDHA, SDHAF2, SDHB, SDHC, SDHD, SMAD4, SMARCA4, STK11, TP53, TSC1, TSC2, and VHL.  RNA data is routinely analyzed for use in variant interpretation for all genes.    Interval History  Ms. Lechelle is here for a telephone visit to review Oncotype results.  She is doing okay in the meantime.  Rest of the pertinent 10 point ROS reviewed and negative  MEDICAL HISTORY:  Past Medical History:  Diagnosis Date   Anxiety    Chronic pain    Depression    Family history of breast cancer 08/23/2022   H/O degenerative disc disease    Pre-diabetes    PTSD (post-traumatic stress disorder)    Sleep apnea     SURGICAL HISTORY: Past Surgical History:  Procedure Laterality Date   ABDOMINAL HYSTERECTOMY     BREAST LUMPECTOMY WITH SENTINEL LYMPH NODE BIOPSY Left 09/14/2022   Procedure: LEFT BREAST CENTRAL LUMPECTOMY WITH SENTINEL LYMPH NODE BX;  Surgeon: Jovita Kussmaul, MD;  Location: [facility];  Service: General;  Laterality: Left;   COLONOSCOPY WITH PROPOFOL N/A 03/15/2016   Procedure: COLONOSCOPY WITH PROPOFOL;  Surgeon: Teena Irani, MD;  Location: [facility];  Service: Endoscopy;  Laterality: N/A;   NECK SURGERY      SOCIAL HISTORY: Social History   Socioeconomic History   Marital status: Single    Spouse name: Not on file   Number of children: Not on file   Years of education: Not on file   Highest education level: Not on file  Occupational History   Not on file  Tobacco Use   Smoking status: Never   Smokeless tobacco: Not on file  Substance and Sexual Activity  Alcohol use: Yes    Comment: occasionally    Drug use: No   Sexual activity: Not on file  Other Topics Concern   Not on file  Social History Narrative   Not on file   Social Determinants of Health   Financial Resource Strain: Not on file  Food Insecurity: Not on file  Transportation Needs: Not on file  Physical Activity: Not on file  Stress: Not on  file  Social Connections: Not on file  Intimate Partner Violence: Not on file    FAMILY HISTORY: Family History  Problem Relation Age of Onset   Lung cancer Mother        dx > 76   Breast cancer Maternal Grandmother 14    ALLERGIES:  has No Known Allergies.  MEDICATIONS:  Current Outpatient Medications  Medication Sig Dispense Refill   albuterol (VENTOLIN HFA) 108 (90 Base) MCG/ACT inhaler SMARTSIG:1 Puff(s) Via Inhaler 4 Times Daily PRN     alendronate (FOSAMAX) 70 MG tablet Take 70 mg by mouth once a week.     amoxicillin-clavulanate (AUGMENTIN) 875-125 MG tablet Take 1 tablet by mouth every 12 (twelve) hours. 14 tablet 0   Blood Glucose Monitoring Suppl (ONETOUCH VERIO REFLECT) w/Device KIT See admin instructions.     [employer] CALCIUM 600 & VITAMIN D3 600-800 MG-UNIT TABS Take 1 tablet by mouth daily.     CVS VITAMIN E 180 MG (400 UNIT) CAPS Take 2 capsules by mouth daily.     fenofibrate (TRICOR) 48 MG tablet Take 40 mg by mouth daily.     Fluticasone-Umeclidin-Vilant (TRELEGY ELLIPTA) 100-62.5-25 MCG/ACT AEPB Inhale 100 mcg into the lungs 1 day or 1 dose.     gabapentin (NEURONTIN) 300 MG capsule Take 300 mg by mouth 3 (three) times daily.     hydrOXYzine (VISTARIL) 25 MG capsule Take 25 mg by mouth 2 (two) times daily as needed for anxiety.   0   metFORMIN (GLUCOPHAGE) 500 MG tablet SMARTSIG:1 Tablet(s) By Mouth Every Evening     mirabegron ER (MYRBETRIQ) 25 MG TB24 tablet Take 1 tablet (25 mg total) by mouth daily. 30 tablet 11   omeprazole (PRILOSEC OTC) 20 MG tablet Take 20 mg by mouth daily.     ondansetron (ZOFRAN ODT) 4 MG disintegrating tablet Take 1 tablet (4 mg total) by mouth every 8 (eight) hours as needed for nausea or vomiting. 8 tablet 0   Oxcarbazepine (TRILEPTAL) 300 MG tablet Take 300 mg by mouth 2 (two) times daily.     oxyCODONE (ROXICODONE) 5 MG immediate release tablet Take 1 tablet (5 mg total) by mouth every 6 (six) hours as needed for severe pain. 10 tablet 0    oxyCODONE-acetaminophen (PERCOCET/ROXICET) 5-325 MG tablet Take 1 tablet by mouth every 6 (six) hours as needed (For pain.).      OZEMPIC, 1 MG/DOSE, 4 MG/3ML SOPN Inject 1 mg into the skin once a week.     rosuvastatin (CRESTOR) 20 MG tablet Take 20 mg by mouth at bedtime.     sertraline (ZOLOFT) 100 MG tablet Take 100 mg by mouth 2 (two) times daily.  2   tamsulosin (FLOMAX) 0.4 MG CAPS capsule TAKE 1 CAPSULE BY MOUTH EVERY DAY 90 capsule 1   topiramate (TOPAMAX) 100 MG tablet Take 100 mg by mouth daily.     No current facility-administered medications for this visit.    REVIEW OF SYSTEMS:   Constitutional: Denies fevers, chills or abnormal night sweats Eyes: Denies blurriness of vision,  double vision or watery eyes Ears, nose, mouth, throat, and face: Denies mucositis or sore throat Respiratory: Denies cough, dyspnea or wheezes Cardiovascular: Denies palpitation, chest discomfort or lower extremity swelling Gastrointestinal:  Denies nausea, heartburn or change in bowel habits Skin: Denies abnormal skin rashes Lymphatics: Denies new lymphadenopathy or easy bruising Neurological:Denies numbness, tingling or new weaknesses Behavioral/Psych: Mood is stable, no new changes  Breast: Denies any palpable lumps or discharge All other systems were reviewed with the patient and are negative.  PHYSICAL EXAMINATION: ECOG PERFORMANCE STATUS: 0 - Asymptomatic  Physical exam deferred today in lieu of a telephone visit  LABORATORY DATA:  I have reviewed the data as listed Lab Results  Component Value Date   WBC 4.7 08/22/2022   HGB 13.5 08/22/2022   HCT 40.1 08/22/2022   MCV 87.6 08/22/2022   PLT 157 08/22/2022   Lab Results  Component Value Date   NA 141 08/22/2022   K 3.9 08/22/2022   CL 109 08/22/2022   CO2 25 08/22/2022    RADIOGRAPHIC STUDIES: I have personally reviewed the radiological reports and agreed with the findings in the report.  ASSESSMENT AND PLAN:  Malignant  neoplasm of central portion of left breast in female, estrogen receptor positive ([place]) This is a very pleasant 61 year old female patient with newly diagnosed left breast invasive ductal carcinoma T1N0 grade 2 ER 100% positive strong staining PR 90% positive strong staining Ki-67 of 10% and HER2 1+ by IHC referred to breast [place] for additional recommendations.   Given early stage, strong ER/PR positivity and no evidence of lymph node involvement we have discussed about upfront surgery followed by Oncotype testing. We have discussed final pathology which showed 1 lymph node involvement malignancy. Oncotype resulted at 15,no benefit from chemotherapy.  We have reviewed that she should proceed with adjuvant radiation followed by antiestrogen therapy along with abemaciclib.  I have sent an in basket message to Dr. Sondra Come requesting that she be rescheduled for follow-up. She is thankful for these results.  She will return to clinic to follow-up with [place] in mid February.  Thank you for consulting [place] in the care of this patient.  Please do not hesitate to contact us with any additional questions or concerns  Total time spent: 30 minutes including history, physical exam, review of records, counseling and coordination of care All questions were answered. The patient knows to call the clinic with any problems, questions or concerns.    Benay Pike, MD 10/04/22

## 2022-10-10 ENCOUNTER — Encounter: Payer: Self-pay | Admitting: *Deleted

## 2022-10-10 ENCOUNTER — Ambulatory Visit
Admission: RE | Admit: 2022-10-10 | Discharge: 2022-10-10 | Disposition: A | Discharge status: Home / Self Care | Payer: Self-pay | Source: Ambulatory Visit | Attending: Radiation Oncology | Admitting: Radiation Oncology

## 2022-10-10 ENCOUNTER — Other Ambulatory Visit: Payer: Self-pay | Admitting: Radiation Oncology

## 2022-10-10 ENCOUNTER — Inpatient Hospital Stay
Admission: RE | Admit: 2022-10-10 | Discharge: 2022-10-10 | Disposition: A | Discharge status: Home / Self Care | Payer: Self-pay | Source: Ambulatory Visit | Attending: Radiation Oncology | Admitting: Radiation Oncology

## 2022-10-10 DIAGNOSIS — C50112 Malignant neoplasm of central portion of left female breast: Secondary | ICD-10-CM

## 2022-10-10 NOTE — Progress Notes (Signed)
Location of Breast Cancer: Malignant neoplasm of central portion of left breast in female, estrogen receptor positive ([facility]   Histology per Pathology Report:  FINAL MICROSCOPIC DIAGNOSIS: A. LYMPH NODE, LEFT AXILLARY #1, SENTINEL, EXCISION: -  1 lymph node positive for malignancy, 2.2 mm in size without evidence of extracapsular extension (1/1). B. LYMPH NODE, LEFT AXILLARY, SENTINEL, EXCISION: -  1 lymph node negative for malignancy, multiple levels examined (0/1). C. LYMPH NODE, LEFT AXILLARY, SENTINEL, EXCISION: -  1 lymph node negative for malignancy, multiple levels examined (0/1). D. BREAST, LEFT CENTRAL, LUMPECTOMY: -  Invasive ductal carcinoma (21 mm in greatest dimension), overall - Nottingham histologic score II of III (tubular score 3/3, nuclear pleomorphism 2/3, mitotic rate 1/3) -  Ductal carcinoma in situ, solid and cribriform type, nuclear grade 2 of 3. -  Calcifications present. -  Margins negative (all margins >1.3 cm, see gross description for further information). pT2 pN1a  Receptor Status:  Estrogen Receptor: Positive, 100% strong            Progesterone Receptor: Positive, 90% strong            HER2: 10%            Ki-67: Negative, 1+ out of 3   Past/Anticipated interventions by surgeon, if any: 09/14/2022 SURGEON:  Surgeon(s) and Role:    * Jovita Kussmaul, MD - Primary PROCEDURE:  Procedure(s): LEFT BREAST CENTRAL LUMPECTOMY WITH SENTINEL LYMPH NODE BX (Left)    Past/Anticipated interventions by medical oncology, if any:  Under care of Dr. Arletha Pili Iruku 10/04/2022 This is a very pleasant 61 year old female patient with newly diagnosed left breast invasive ductal carcinoma T1N0 grade 2 ER 100% positive strong staining PR 90% positive strong staining Ki-67 of 10% and HER2 1+ by IHC referred to breast [place] for additional recommendations.   Given early stage, strong ER/PR positivity and no evidence of lymph node involvement we have discussed about upfront surgery  followed by Oncotype testing. We have discussed final pathology which showed 1 lymph node involvement malignancy. Oncotype resulted at 15,no benefit from chemotherapy.  We have reviewed that she should proceed with adjuvant radiation followed by antiestrogen therapy along with abemaciclib.  I have sent an in basket message to Dr. Sondra Come requesting that she be rescheduled for follow-up. She is thankful for these results.  She will return to clinic to follow-up with [place] in mid February.   Lymphedema issues, if any:  Denies   Pain issues, if any:  Occasional pain to  surgical site  SAFETY ISSUES: Prior radiation? No Pacemaker/ICD? No Possible current pregnancy? No--Hysterectomy Is the patient on methotrexate? No  Current Complaints / other details:  Lives at home with her son, daughter and 2 grandchildren

## 2022-10-16 NOTE — Progress Notes (Signed)
Radiation Oncology         ([phone]) [phone] ________________________________  Name: Kelli Estrada MRN: [id number]  Date: 10/17/2022  DOB: 10-15-1961  Re-Evaluation Note  CC: Sherald Hess., MD  Benay Pike, MD    ICD-10-CM   1. Malignant neoplasm of central portion of left breast in female, estrogen receptor positive [facility])  C50.112 Ambulatory referral to Social Work   Z17.0       Diagnosis:   clinical Stage IA (cT1, cN0, cM0) Central Left Breast, Invasive Ductal Carcinoma, ER+ / PR+ / Her2-, Grade 2 : s/p lumpectomy and SLN biopsies with 1 positive node and clean margins   Narrative:  The patient returns today to discuss radiation treatment options. She was seen in the multidisciplinary breast clinic on 08/22/22.  On her consultation date, she agreed to pursue genetic testing. Results showed no clinically significant variants detected by BRCAplus or +RNAinsight testing.  She opted to proceed with a left breast lumpectomy with nodal biopsies on 09/14/22 under the care of Dr. Marlou Starks. Pathology from the procedure revealed: grade 2 invasive ductal carcinoma measuring 21 mm in the greatest extent with intermediate grade DCIS and calcifications. All margins negative for both invasive and in-situ disease. Nodal status of 1/3 left axillary sentinel lymph node excisions positive for metastatic carcinoma measuring 2.2 mm (negative for extracapsular extension). Prognostic indicators significant for: estrogen receptor 100% positive and progesterone receptor 90% positive, both with strong staining intensity; Proliferation marker Ki67 at 10%; Her2 status negative; Grade 2.    Oncotype DX was obtained on the final surgical sample and the recurrence score of 15 predicts no significant benefit from chemotherapy.  Post-operatively, the patient was noted to have a small left axillary seroma which was too small to aspirate. Dr. Marlou Starks is confident that this was resolve on it's own.   The patient has met  with Dr. Chryl Heck and is agreeable to proceed with antiestrogen therapy along with abemaciclib following XRT. The patient will return to Dr. Chryl Heck next month for further discussion of antiestrogen treatment options.    On review of systems, the patient reports no significant discomfort in the left breast or axillary area.. She denies swelling in her left arm or hand.  She does report some itching along the sternum region related to her compression bra.   Allergies:  has No Known Allergies.  Meds: Current Outpatient Medications  Medication Sig Dispense Refill   albuterol (VENTOLIN HFA) 108 (90 Base) MCG/ACT inhaler SMARTSIG:1 Puff(s) Via Inhaler 4 Times Daily PRN     alendronate (FOSAMAX) 70 MG tablet Take 70 mg by mouth once a week.     Blood Glucose Monitoring Suppl (ONETOUCH VERIO REFLECT) w/Device KIT See admin instructions.     [employer]S CALCIUM 600 & VITAMIN D3 600-800 MG-UNIT TABS Take 1 tablet by mouth daily.     CVS VITAMIN E 180 MG (400 UNIT) CAPS Take 2 capsules by mouth daily.     fenofibrate (TRICOR) 48 MG tablet Take 40 mg by mouth daily.     Fluticasone-Umeclidin-Vilant (TRELEGY ELLIPTA) 100-62.5-25 MCG/ACT AEPB Inhale 100 mcg into the lungs 1 day or 1 dose.     gabapentin (NEURONTIN) 300 MG capsule Take 300 mg by mouth 3 (three) times daily.     hydrOXYzine (VISTARIL) 25 MG capsule Take 25 mg by mouth 2 (two) times daily as needed for anxiety.   0   metFORMIN (GLUCOPHAGE) 500 MG tablet SMARTSIG:1 Tablet(s) By Mouth Every Evening     mirabegron ER (  MYRBETRIQ) 25 MG TB24 tablet Take 1 tablet (25 mg total) by mouth daily. 30 tablet 11   omeprazole (PRILOSEC OTC) 20 MG tablet Take 20 mg by mouth daily.     Oxcarbazepine (TRILEPTAL) 300 MG tablet Take 300 mg by mouth 2 (two) times daily.     oxyCODONE-acetaminophen (PERCOCET/ROXICET) 5-325 MG tablet Take 1 tablet by mouth every 6 (six) hours as needed (For pain.).      OZEMPIC, 1 MG/DOSE, 4 MG/3ML SOPN Inject 1 mg into the skin once a week.      rosuvastatin (CRESTOR) 20 MG tablet Take 20 mg by mouth at bedtime.     sertraline (ZOLOFT) 100 MG tablet Take 100 mg by mouth 2 (two) times daily.  2   tamsulosin (FLOMAX) 0.4 MG CAPS capsule TAKE 1 CAPSULE BY MOUTH EVERY DAY 90 capsule 1   topiramate (TOPAMAX) 100 MG tablet Take 100 mg by mouth daily.     No current facility-administered medications for this encounter.    Physical Findings: The patient is in no acute distress. Patient is alert and oriented.  height is _0  (1.6 m) and weight is 253 lb 6.4 oz (114.9 kg). Her temperature is 97.6 F (36.4 C). Her blood pressure is 109/69 and her pulse is 70. Her respiration is 20 and oxygen saturation is 98%.   Lungs are clear to auscultation bilaterally. Heart has regular rate and rhythm. No palpable cervical, supraclavicular, or axillary adenopathy. Abdomen soft, non-tender, normal bowel sounds. Right Breast: no palpable mass, nipple discharge or bleeding.  Large and pendulous Left Breast: Central scar noted.  nipple areolar complex area surgically missing.  Separate scar in the axillary region from sentinel node procedure,  both well-healed.  No signs of infection within the breast.  Lab Findings: Lab Results  Component Value Date   WBC 4.7 08/22/2022   HGB 13.5 08/22/2022   HCT 40.1 08/22/2022   MCV 87.6 08/22/2022   PLT 157 08/22/2022    Radiographic Findings: No results found.  Impression:   (pT2, pN1, cM0) Central Left Breast, Invasive Ductal Carcinoma, ER+ / PR+ / Her2-, Grade 2 : s/p lumpectomy and SLN biopsies with 1 positive node and clean margins   She would be a good candidate for adjuvant radiation therapy to reduce chances of recurrence within the breast.  Given her node positivity would also recommend local regional radiation.  I discussed the overall course of treatment,  anticipated side effects and potential long-term toxicities comprehensive left-sided  breast radiation therapy.  She appears understand and  wishes to proceed with planned course of treatment.  Plan:  She is scheduled for CT simulation later today.  Anticipate 6-1/2 weeks of radiation therapy.  Will use cardiac sparing techniques if necessary.  -----------------------------------  Blair Promise, PhD, MD  This document serves as a record of services personally performed by Gery Pray, MD. It was created on his behalf by Roney Mans, a trained medical scribe. The creation of this record is based on the scribe's personal observations and the provider's statements to them. This document has been checked and approved by the attending provider.

## 2022-10-17 ENCOUNTER — Encounter: Payer: Self-pay | Admitting: Radiation Oncology

## 2022-10-17 ENCOUNTER — Ambulatory Visit
Admission: RE | Admit: 2022-10-17 | Discharge: 2022-10-17 | Disposition: A | Discharge status: Home / Self Care | Payer: Medicare Other | Source: Ambulatory Visit | Attending: Radiation Oncology | Admitting: Radiation Oncology

## 2022-10-17 ENCOUNTER — Ambulatory Visit
Admission: RE | Admit: 2022-10-17 | Discharge: 2022-10-17 | Disposition: A | Discharge status: Home / Self Care | Payer: 59 | Source: Ambulatory Visit | Attending: Radiation Oncology | Admitting: Radiation Oncology

## 2022-10-17 ENCOUNTER — Other Ambulatory Visit: Payer: Self-pay

## 2022-10-17 VITALS — BP 109/69 | HR 70 | Temp 97.6°F | Resp 20 | Ht 63.0 in | Wt 253.4 lb

## 2022-10-17 DIAGNOSIS — Z170 Estrogen receptor positive status [ER+]: Secondary | ICD-10-CM | POA: Insufficient documentation

## 2022-10-17 DIAGNOSIS — C50112 Malignant neoplasm of central portion of left female breast: Secondary | ICD-10-CM

## 2022-10-17 DIAGNOSIS — Z510 Encounter for antineoplastic radiation therapy: Secondary | ICD-10-CM | POA: Insufficient documentation

## 2022-10-17 DIAGNOSIS — Z79899 Other long term (current) drug therapy: Secondary | ICD-10-CM | POA: Diagnosis not present

## 2022-10-17 DIAGNOSIS — Z7984 Long term (current) use of oral hypoglycemic drugs: Secondary | ICD-10-CM | POA: Insufficient documentation

## 2022-10-18 ENCOUNTER — Telehealth: Payer: Self-pay | Admitting: Licensed Clinical Social Worker

## 2022-10-18 ENCOUNTER — Inpatient Hospital Stay: Payer: 59 | Attending: Hematology and Oncology | Admitting: Licensed Clinical Social Worker

## 2022-10-18 NOTE — Telephone Encounter (Signed)
[facility] Clinical Social Work  Clinical Social Work was referred by nurse for assessment of psychosocial needs.  Clinical Social Worker attempted to contact patient by phone  to offer support and assess for needs.   No answer. Left VM with direct contact information.     Evelyn Moch E Johnita Palleschi, LCSW  Clinical Social Worker [facility]        

## 2022-10-18 NOTE — Progress Notes (Signed)
[facility] Clinical Social Work  Initial Assessment   Kelli Estrada is a 62 y.o. year old female contacted by phone. Clinical Social Work was referred by nurse for assessment of psychosocial needs.   SDOH (Social Determinants of Health) assessments performed: Yes SDOH Interventions    Flowsheet Row Clinical Support from 10/18/2022 in [facility]  SDOH Interventions   Financial Strain Interventions Other (Comment)  [employer]]       SDOH Screenings   Financial Resource Strain: Medium Risk (10/18/2022)  Tobacco Use: Unknown (10/17/2022)     Distress Screen completed: No    08/22/2022    3:26 PM  ONCBCN DISTRESS SCREENING  Distress experienced in past week (1-10) 10  Practical problem type Transportation  Emotional problem type Depression;Nervousness/Anxiety;Adjusting to illness;Isolation/feeling alone;Feeling hopeless  Spiritual/Religous concerns type Loss of sense of purpose  Information Concerns Type Lack of info about diagnosis;Lack of info about treatment;Lack of info about complementary therapy choices;Lack of info about maintaining fitness  Physical Problem type Pain;Changes in urination;Tingling hands/feet;Skin dry/itchy;Swollen arms/legs  Referral to support programs No      Family/Social Information:  Housing Arrangement: patient lives with daughter and grandkids Family members/support persons in your life? Family Transportation concerns: daughter helps and pt is aware of her insurance transportation if needed  Employment: Legally disabled .  Income source: Electrical engineer, Theatre stage manager Income, and Supported by [employer] Financial concerns: Yes, current concerns Type of concern: Social worker access concerns: no Religious or spiritual practice: Not known Services Currently in place:  SSI/SSA benefits. Medicare & Medicaid  Coping/ Adjustment to diagnosis: Patient understands treatment plan and what  happens next? yes, has radiation coming up starting next week Concerns about diagnosis and/or treatment:  Increased living costs and strain on daughter Patient reported stressors: Finances and Adjusting to my illness Hopes and/or priorities: wants to finish treatment and reorganize her finances to help her daughter and save for her dream tiny home Patient enjoys time with family/ friends Current coping skills/ strengths: Motivation for treatment/growth  and Supportive family/friends     SUMMARY: Current SDOH Barriers:  Financial constraints related to increased costs with travel for treatment Depression related to adjustment to illness  Clinical Social Work Clinical Goal(s):  Explore community resource options for unmet needs related to:  Data processing manager for counseling related to adjustment to illness  Interventions: Discussed common feeling and emotions when being diagnosed with cancer, and the importance of support during treatment Informed patient of the support team roles and support services at [facility] Provided [place] contact information and encouraged patient to call with any questions or concerns Submitted Lacy Duverney application Pt to bring documents for [employer] (working with Vincente Liberty) Will sign up for [employer] at future visit   Follow Up Plan: Patient will contact [place] with any support or resource needs and CSW will plan to see pt during a radiation appt to sign up for [employer] Patient verbalizes understanding of plan: Yes    Floride Hutmacher E Kerryann Allaire, LCSW

## 2022-10-24 DIAGNOSIS — Z510 Encounter for antineoplastic radiation therapy: Secondary | ICD-10-CM | POA: Diagnosis not present

## 2022-10-25 ENCOUNTER — Ambulatory Visit
Admission: RE | Admit: 2022-10-25 | Discharge: 2022-10-25 | Disposition: A | Discharge status: Home / Self Care | Payer: 59 | Source: Ambulatory Visit | Attending: Radiation Oncology | Admitting: Radiation Oncology

## 2022-10-25 ENCOUNTER — Other Ambulatory Visit: Payer: Self-pay

## 2022-10-25 ENCOUNTER — Telehealth: Payer: Self-pay

## 2022-10-25 DIAGNOSIS — Z510 Encounter for antineoplastic radiation therapy: Secondary | ICD-10-CM | POA: Diagnosis not present

## 2022-10-25 DIAGNOSIS — C50112 Malignant neoplasm of central portion of left female breast: Secondary | ICD-10-CM

## 2022-10-25 LAB — RAD ONC ARIA SESSION SUMMARY

## 2022-10-25 NOTE — Telephone Encounter (Signed)
Lysle Morales, counseling intern, called patient to schedule a counseling session after receiving a referral.   The patient did not answer the phone. The counselor left a message and will try again next week.   Lysle Morales,  Counseling Intern  [phone] [email or web address]

## 2022-10-26 ENCOUNTER — Ambulatory Visit
Admission: RE | Admit: 2022-10-26 | Discharge: 2022-10-26 | Disposition: A | Discharge status: Home / Self Care | Payer: 59 | Source: Ambulatory Visit | Attending: Radiation Oncology | Admitting: Radiation Oncology

## 2022-10-26 ENCOUNTER — Inpatient Hospital Stay: Payer: 59

## 2022-10-26 ENCOUNTER — Other Ambulatory Visit: Payer: Self-pay

## 2022-10-26 DIAGNOSIS — Z510 Encounter for antineoplastic radiation therapy: Secondary | ICD-10-CM | POA: Diagnosis not present

## 2022-10-26 LAB — RAD ONC ARIA SESSION SUMMARY

## 2022-10-26 NOTE — Progress Notes (Signed)
[facility] CSW Progress Note  Clinical Education officer, museum met with patient to discuss her Community Alternatives Program (CAP) request forms.  Reviewed with patient and will forward appropriate forms to MD.    Patient expressed increased anxiety after treatment today.  She feels overwhelmed with tasks she said she must complete.  Her daughter, Anderson Malta, was also present and was very supportive.  Will provide information to primary CSW, Garnet Koyanagi.    Rodman Pickle Kaleeya Hancock, LCSW    Patient is participating in a Managed Medicaid Plan:  Yes

## 2022-10-29 ENCOUNTER — Ambulatory Visit
Admission: RE | Admit: 2022-10-29 | Discharge: 2022-10-29 | Disposition: A | Discharge status: Home / Self Care | Payer: 59 | Source: Ambulatory Visit | Attending: Radiation Oncology | Admitting: Radiation Oncology

## 2022-10-29 ENCOUNTER — Telehealth: Payer: Self-pay

## 2022-10-29 ENCOUNTER — Other Ambulatory Visit: Payer: Self-pay

## 2022-10-29 DIAGNOSIS — Z510 Encounter for antineoplastic radiation therapy: Secondary | ICD-10-CM | POA: Diagnosis not present

## 2022-10-29 LAB — RAD ONC ARIA SESSION SUMMARY
Course Elapsed Days: 4
Plan Fractions Treated to Date: 3
Plan Fractions Treated to Date: 3
Plan Prescribed Dose Per Fraction: 1.8 Gy
Plan Prescribed Dose Per Fraction: 1.8 Gy
Plan Total Fractions Prescribed: 28
Plan Total Fractions Prescribed: 28
Plan Total Prescribed Dose: 50.4 Gy
Plan Total Prescribed Dose: 50.4 Gy
Reference Point Dosage Given to Date: 5.4 Gy
Reference Point Dosage Given to Date: 5.4 Gy
Reference Point Session Dosage Given: 1.8 Gy
Reference Point Session Dosage Given: 1.8 Gy
Session Number: 3

## 2022-10-29 NOTE — Telephone Encounter (Signed)
Kelli Estrada, counseling intern, called patient after receiving a referral from the social worker.   Patient did not answer their phone. The counselor left a message and will try calling again next week.   Kelli Estrada,  Counseling Intern  [phone] [email or web address]

## 2022-10-30 ENCOUNTER — Other Ambulatory Visit: Payer: Self-pay

## 2022-10-30 ENCOUNTER — Ambulatory Visit
Admission: RE | Admit: 2022-10-30 | Discharge: 2022-10-30 | Disposition: A | Discharge status: Home / Self Care | Payer: 59 | Source: Ambulatory Visit | Attending: Radiation Oncology | Admitting: Radiation Oncology

## 2022-10-30 DIAGNOSIS — Z510 Encounter for antineoplastic radiation therapy: Secondary | ICD-10-CM | POA: Diagnosis not present

## 2022-10-30 DIAGNOSIS — Z170 Estrogen receptor positive status [ER+]: Secondary | ICD-10-CM

## 2022-10-30 LAB — RAD ONC ARIA SESSION SUMMARY

## 2022-10-30 MED ORDER — ALRA NON-METALLIC DEODORANT (RAD-ONC)
1.0000 | Freq: Once | TOPICAL | Status: AC
  Administered 2022-10-30: 1 via TOPICAL

## 2022-10-30 MED ORDER — RADIAPLEXRX EX GEL: Freq: Once | CUTANEOUS | Status: AC

## 2022-10-31 ENCOUNTER — Ambulatory Visit
Admission: RE | Admit: 2022-10-31 | Discharge: 2022-10-31 | Disposition: A | Discharge status: Home / Self Care | Payer: 59 | Source: Ambulatory Visit | Attending: Radiation Oncology | Admitting: Radiation Oncology

## 2022-10-31 ENCOUNTER — Other Ambulatory Visit: Payer: Self-pay

## 2022-10-31 DIAGNOSIS — Z510 Encounter for antineoplastic radiation therapy: Secondary | ICD-10-CM | POA: Diagnosis not present

## 2022-10-31 LAB — RAD ONC ARIA SESSION SUMMARY

## 2022-11-01 ENCOUNTER — Encounter: Payer: Self-pay | Admitting: *Deleted

## 2022-11-01 ENCOUNTER — Other Ambulatory Visit: Payer: Self-pay

## 2022-11-01 ENCOUNTER — Ambulatory Visit
Admission: RE | Admit: 2022-11-01 | Discharge: 2022-11-01 | Disposition: A | Discharge status: Home / Self Care | Payer: 59 | Source: Ambulatory Visit | Attending: Radiation Oncology | Admitting: Radiation Oncology

## 2022-11-01 DIAGNOSIS — Z510 Encounter for antineoplastic radiation therapy: Secondary | ICD-10-CM | POA: Diagnosis not present

## 2022-11-01 LAB — RAD ONC ARIA SESSION SUMMARY

## 2022-11-02 ENCOUNTER — Other Ambulatory Visit: Payer: Self-pay

## 2022-11-02 ENCOUNTER — Ambulatory Visit
Admission: RE | Admit: 2022-11-02 | Discharge: 2022-11-02 | Disposition: A | Discharge status: Home / Self Care | Payer: 59 | Source: Ambulatory Visit | Attending: Radiation Oncology | Admitting: Radiation Oncology

## 2022-11-02 DIAGNOSIS — Z510 Encounter for antineoplastic radiation therapy: Secondary | ICD-10-CM | POA: Diagnosis not present

## 2022-11-02 LAB — RAD ONC ARIA SESSION SUMMARY

## 2022-11-05 ENCOUNTER — Other Ambulatory Visit: Payer: Self-pay

## 2022-11-05 ENCOUNTER — Ambulatory Visit: Payer: 59

## 2022-11-05 ENCOUNTER — Ambulatory Visit
Admission: RE | Admit: 2022-11-05 | Discharge: 2022-11-05 | Disposition: A | Discharge status: Home / Self Care | Payer: 59 | Source: Ambulatory Visit | Attending: Radiation Oncology | Admitting: Radiation Oncology

## 2022-11-05 DIAGNOSIS — Z510 Encounter for antineoplastic radiation therapy: Secondary | ICD-10-CM | POA: Diagnosis not present

## 2022-11-05 LAB — RAD ONC ARIA SESSION SUMMARY
Course Elapsed Days: 11
Plan Fractions Treated to Date: 8
Plan Fractions Treated to Date: 8
Plan Prescribed Dose Per Fraction: 1.8 Gy
Plan Prescribed Dose Per Fraction: 1.8 Gy
Plan Total Fractions Prescribed: 28
Plan Total Fractions Prescribed: 28
Plan Total Prescribed Dose: 50.4 Gy
Plan Total Prescribed Dose: 50.4 Gy
Reference Point Dosage Given to Date: 14.4 Gy
Reference Point Dosage Given to Date: 14.4 Gy
Reference Point Session Dosage Given: 1.8 Gy
Reference Point Session Dosage Given: 1.8 Gy
Session Number: 8

## 2022-11-06 ENCOUNTER — Ambulatory Visit
Admission: RE | Admit: 2022-11-06 | Discharge: 2022-11-06 | Disposition: A | Discharge status: Home / Self Care | Payer: 59 | Source: Ambulatory Visit | Attending: Radiation Oncology | Admitting: Radiation Oncology

## 2022-11-06 ENCOUNTER — Other Ambulatory Visit: Payer: Self-pay

## 2022-11-06 DIAGNOSIS — Z510 Encounter for antineoplastic radiation therapy: Secondary | ICD-10-CM | POA: Diagnosis not present

## 2022-11-06 LAB — RAD ONC ARIA SESSION SUMMARY
Course Elapsed Days: 12
Plan Fractions Treated to Date: 9
Plan Fractions Treated to Date: 9
Plan Prescribed Dose Per Fraction: 1.8 Gy
Plan Prescribed Dose Per Fraction: 1.8 Gy
Plan Total Fractions Prescribed: 28
Plan Total Fractions Prescribed: 28
Plan Total Prescribed Dose: 50.4 Gy
Plan Total Prescribed Dose: 50.4 Gy
Reference Point Dosage Given to Date: 16.2 Gy
Reference Point Dosage Given to Date: 16.2 Gy
Reference Point Session Dosage Given: 1.8 Gy
Reference Point Session Dosage Given: 1.8 Gy
Session Number: 9

## 2022-11-07 ENCOUNTER — Ambulatory Visit
Admission: RE | Admit: 2022-11-07 | Discharge: 2022-11-07 | Disposition: A | Discharge status: Home / Self Care | Payer: 59 | Source: Ambulatory Visit | Attending: Radiation Oncology | Admitting: Radiation Oncology

## 2022-11-07 ENCOUNTER — Telehealth: Payer: Self-pay

## 2022-11-07 ENCOUNTER — Other Ambulatory Visit: Payer: Self-pay

## 2022-11-07 DIAGNOSIS — Z510 Encounter for antineoplastic radiation therapy: Secondary | ICD-10-CM | POA: Diagnosis not present

## 2022-11-07 LAB — RAD ONC ARIA SESSION SUMMARY

## 2022-11-07 NOTE — Telephone Encounter (Signed)
Lysle Morales, counseling intern, called patient to schedule a counseling session.   The patient scheduled their first counseling session for Monday, January 29th, at 3pm. The session will be conducted over [employer].   Lysle Morales,  Counseling Intern  [phone] [email or web address]

## 2022-11-08 ENCOUNTER — Other Ambulatory Visit: Payer: Self-pay

## 2022-11-08 ENCOUNTER — Ambulatory Visit
Admission: RE | Admit: 2022-11-08 | Discharge: 2022-11-08 | Disposition: A | Discharge status: Home / Self Care | Payer: 59 | Source: Ambulatory Visit | Attending: Radiation Oncology | Admitting: Radiation Oncology

## 2022-11-08 ENCOUNTER — Inpatient Hospital Stay: Payer: 59 | Admitting: Dietician

## 2022-11-08 DIAGNOSIS — Z510 Encounter for antineoplastic radiation therapy: Secondary | ICD-10-CM | POA: Diagnosis not present

## 2022-11-08 LAB — RAD ONC ARIA SESSION SUMMARY

## 2022-11-08 NOTE — Progress Notes (Signed)
Patient attended Nutrition 101 class 11/08/22

## 2022-11-09 ENCOUNTER — Other Ambulatory Visit: Payer: Self-pay

## 2022-11-09 ENCOUNTER — Ambulatory Visit
Admission: RE | Admit: 2022-11-09 | Discharge: 2022-11-09 | Disposition: A | Discharge status: Home / Self Care | Payer: 59 | Source: Ambulatory Visit | Attending: Radiation Oncology | Admitting: Radiation Oncology

## 2022-11-09 DIAGNOSIS — Z510 Encounter for antineoplastic radiation therapy: Secondary | ICD-10-CM | POA: Diagnosis not present

## 2022-11-09 LAB — RAD ONC ARIA SESSION SUMMARY

## 2022-11-12 ENCOUNTER — Ambulatory Visit
Admission: RE | Admit: 2022-11-12 | Discharge: 2022-11-12 | Disposition: A | Discharge status: Home / Self Care | Payer: 59 | Source: Ambulatory Visit | Attending: Radiation Oncology | Admitting: Radiation Oncology

## 2022-11-12 ENCOUNTER — Other Ambulatory Visit: Payer: Self-pay

## 2022-11-12 DIAGNOSIS — Z510 Encounter for antineoplastic radiation therapy: Secondary | ICD-10-CM | POA: Diagnosis not present

## 2022-11-12 LAB — RAD ONC ARIA SESSION SUMMARY
Course Elapsed Days: 18
Plan Fractions Treated to Date: 13
Plan Fractions Treated to Date: 13
Plan Prescribed Dose Per Fraction: 1.8 Gy
Plan Prescribed Dose Per Fraction: 1.8 Gy
Plan Total Fractions Prescribed: 28
Plan Total Fractions Prescribed: 28
Plan Total Prescribed Dose: 50.4 Gy
Plan Total Prescribed Dose: 50.4 Gy
Reference Point Dosage Given to Date: 23.4 Gy
Reference Point Dosage Given to Date: 23.4 Gy
Reference Point Session Dosage Given: 1.8 Gy
Reference Point Session Dosage Given: 1.8 Gy
Session Number: 13

## 2022-11-12 NOTE — Progress Notes (Signed)
Lysle Morales, counseling intern, met with patient for their scheduled counseling session.   The counselor shared their credentials with the client and reviewed the counselor's PDS.   The patient shared the stress they have undergone over the last handful of years. The patient shared they experienced stress with a roommate which forced them to move in with their daughter. The patient shared last year they had a difficult financial year and they are trying to catch up.   The patient shared sometimes they wish someone would care for them. The patient shared they feel hopeless and want to give up. The counselor asked if the feeling of wanting to give up meant they wanted to die. The patient responded no.   The counselor and patient set goals for next session. Those goals are to prioritize the patient's to do list while keeping the exhaustion of treatment in mind.   The patient scheduled their next counseling session for Monday, Feb 5th at [employer] via [employer].   Lysle Morales,  Counseling Intern  [phone] [email or web address]

## 2022-11-13 ENCOUNTER — Ambulatory Visit
Admission: RE | Admit: 2022-11-13 | Discharge: 2022-11-13 | Disposition: A | Discharge status: Home / Self Care | Payer: 59 | Source: Ambulatory Visit | Attending: Radiation Oncology | Admitting: Radiation Oncology

## 2022-11-13 ENCOUNTER — Other Ambulatory Visit: Payer: Self-pay

## 2022-11-13 DIAGNOSIS — Z510 Encounter for antineoplastic radiation therapy: Secondary | ICD-10-CM | POA: Diagnosis not present

## 2022-11-13 LAB — RAD ONC ARIA SESSION SUMMARY
Course Elapsed Days: 19
Plan Fractions Treated to Date: 14
Plan Fractions Treated to Date: 14
Plan Prescribed Dose Per Fraction: 1.8 Gy
Plan Prescribed Dose Per Fraction: 1.8 Gy
Plan Total Fractions Prescribed: 28
Plan Total Fractions Prescribed: 28
Plan Total Prescribed Dose: 50.4 Gy
Plan Total Prescribed Dose: 50.4 Gy
Reference Point Dosage Given to Date: 25.2 Gy
Reference Point Dosage Given to Date: 25.2 Gy
Reference Point Session Dosage Given: 1.8 Gy
Reference Point Session Dosage Given: 1.8 Gy
Session Number: 14

## 2022-11-14 ENCOUNTER — Other Ambulatory Visit: Payer: Self-pay

## 2022-11-14 ENCOUNTER — Ambulatory Visit
Admission: RE | Admit: 2022-11-14 | Discharge: 2022-11-14 | Disposition: A | Discharge status: Home / Self Care | Payer: 59 | Source: Ambulatory Visit | Attending: Radiation Oncology | Admitting: Radiation Oncology

## 2022-11-14 DIAGNOSIS — Z510 Encounter for antineoplastic radiation therapy: Secondary | ICD-10-CM | POA: Diagnosis not present

## 2022-11-14 LAB — RAD ONC ARIA SESSION SUMMARY
Course Elapsed Days: 20
Plan Fractions Treated to Date: 15
Plan Fractions Treated to Date: 15
Plan Prescribed Dose Per Fraction: 1.8 Gy
Plan Prescribed Dose Per Fraction: 1.8 Gy
Plan Total Fractions Prescribed: 28
Plan Total Fractions Prescribed: 28
Plan Total Prescribed Dose: 50.4 Gy
Plan Total Prescribed Dose: 50.4 Gy
Reference Point Dosage Given to Date: 27 Gy
Reference Point Dosage Given to Date: 27 Gy
Reference Point Session Dosage Given: 1.8 Gy
Reference Point Session Dosage Given: 1.8 Gy
Session Number: 15

## 2022-11-15 ENCOUNTER — Other Ambulatory Visit: Payer: Self-pay

## 2022-11-15 ENCOUNTER — Ambulatory Visit
Admission: RE | Admit: 2022-11-15 | Discharge: 2022-11-15 | Disposition: A | Discharge status: Home / Self Care | Payer: 59 | Source: Ambulatory Visit | Attending: Radiation Oncology | Admitting: Radiation Oncology

## 2022-11-15 DIAGNOSIS — C50112 Malignant neoplasm of central portion of left female breast: Secondary | ICD-10-CM | POA: Diagnosis present

## 2022-11-15 DIAGNOSIS — Z170 Estrogen receptor positive status [ER+]: Secondary | ICD-10-CM | POA: Diagnosis present

## 2022-11-15 LAB — RAD ONC ARIA SESSION SUMMARY
Course Elapsed Days: 21
Plan Fractions Treated to Date: 16
Plan Fractions Treated to Date: 16
Plan Prescribed Dose Per Fraction: 1.8 Gy
Plan Prescribed Dose Per Fraction: 1.8 Gy
Plan Total Fractions Prescribed: 28
Plan Total Fractions Prescribed: 28
Plan Total Prescribed Dose: 50.4 Gy
Plan Total Prescribed Dose: 50.4 Gy
Reference Point Dosage Given to Date: 28.8 Gy
Reference Point Dosage Given to Date: 28.8 Gy
Reference Point Session Dosage Given: 1.8 Gy
Reference Point Session Dosage Given: 1.8 Gy
Session Number: 16

## 2022-11-16 ENCOUNTER — Other Ambulatory Visit: Payer: Self-pay

## 2022-11-16 ENCOUNTER — Ambulatory Visit
Admission: RE | Admit: 2022-11-16 | Discharge: 2022-11-16 | Disposition: A | Discharge status: Home / Self Care | Payer: 59 | Source: Ambulatory Visit | Attending: Radiation Oncology | Admitting: Radiation Oncology

## 2022-11-16 DIAGNOSIS — C50112 Malignant neoplasm of central portion of left female breast: Secondary | ICD-10-CM | POA: Diagnosis not present

## 2022-11-16 LAB — RAD ONC ARIA SESSION SUMMARY
Course Elapsed Days: 22
Plan Fractions Treated to Date: 17
Plan Fractions Treated to Date: 17
Plan Prescribed Dose Per Fraction: 1.8 Gy
Plan Prescribed Dose Per Fraction: 1.8 Gy
Plan Total Fractions Prescribed: 28
Plan Total Fractions Prescribed: 28
Plan Total Prescribed Dose: 50.4 Gy
Plan Total Prescribed Dose: 50.4 Gy
Reference Point Dosage Given to Date: 30.6 Gy
Reference Point Dosage Given to Date: 30.6 Gy
Reference Point Session Dosage Given: 1.8 Gy
Reference Point Session Dosage Given: 1.8 Gy
Session Number: 17

## 2022-11-19 ENCOUNTER — Ambulatory Visit
Admission: RE | Admit: 2022-11-19 | Discharge: 2022-11-19 | Disposition: A | Discharge status: Home / Self Care | Payer: 59 | Source: Ambulatory Visit | Attending: Radiation Oncology | Admitting: Radiation Oncology

## 2022-11-19 ENCOUNTER — Other Ambulatory Visit: Payer: Self-pay

## 2022-11-19 DIAGNOSIS — C50112 Malignant neoplasm of central portion of left female breast: Secondary | ICD-10-CM | POA: Diagnosis not present

## 2022-11-19 LAB — RAD ONC ARIA SESSION SUMMARY
Course Elapsed Days: 25
Plan Fractions Treated to Date: 18
Plan Fractions Treated to Date: 18
Plan Prescribed Dose Per Fraction: 1.8 Gy
Plan Prescribed Dose Per Fraction: 1.8 Gy
Plan Total Fractions Prescribed: 28
Plan Total Fractions Prescribed: 28
Plan Total Prescribed Dose: 50.4 Gy
Plan Total Prescribed Dose: 50.4 Gy
Reference Point Dosage Given to Date: 32.4 Gy
Reference Point Dosage Given to Date: 32.4 Gy
Reference Point Session Dosage Given: 1.8 Gy
Reference Point Session Dosage Given: 1.8 Gy
Session Number: 18

## 2022-11-19 NOTE — Progress Notes (Signed)
Kelli Estrada, counseling intern, met with patient for their scheduled counseling session.   The patient shared their daughter had a financial setback this week and it has caused temporary stress.   The patient shared they find strength and community watching a tiny home builder on youtube. The patient shared this builder has gone through a lot in their life and overcame. The patient shared it makes them feel like they can also overcome.   The patient shared they have spent their life getting into trouble for things other people did. The patient shared this left them feeling "why me."  The patient shared when they were younger they could persevere but now they feel like they can't focus. The counselor and patient will talk about strategies to focus and what to prioritize in the next session.   The patient scheduled their next counseling session for Monday, Feb. 19th at [place],  Counseling Intern [phone] [email or web address]

## 2022-11-20 ENCOUNTER — Other Ambulatory Visit: Payer: Self-pay

## 2022-11-20 ENCOUNTER — Ambulatory Visit
Admission: RE | Admit: 2022-11-20 | Discharge: 2022-11-20 | Disposition: A | Discharge status: Home / Self Care | Payer: 59 | Source: Ambulatory Visit | Attending: Radiation Oncology | Admitting: Radiation Oncology

## 2022-11-20 DIAGNOSIS — Z170 Estrogen receptor positive status [ER+]: Secondary | ICD-10-CM

## 2022-11-20 DIAGNOSIS — C50112 Malignant neoplasm of central portion of left female breast: Secondary | ICD-10-CM | POA: Diagnosis not present

## 2022-11-20 LAB — RAD ONC ARIA SESSION SUMMARY
Course Elapsed Days: 26
Plan Fractions Treated to Date: 19
Plan Fractions Treated to Date: 19
Plan Prescribed Dose Per Fraction: 1.8 Gy
Plan Prescribed Dose Per Fraction: 1.8 Gy
Plan Total Fractions Prescribed: 28
Plan Total Fractions Prescribed: 28
Plan Total Prescribed Dose: 50.4 Gy
Plan Total Prescribed Dose: 50.4 Gy
Reference Point Dosage Given to Date: 34.2 Gy
Reference Point Dosage Given to Date: 34.2 Gy
Reference Point Session Dosage Given: 1.8 Gy
Reference Point Session Dosage Given: 1.8 Gy
Session Number: 19

## 2022-11-20 MED ORDER — RADIAPLEXRX EX GEL: Freq: Once | CUTANEOUS | Status: AC

## 2022-11-21 ENCOUNTER — Other Ambulatory Visit: Payer: Self-pay

## 2022-11-21 ENCOUNTER — Ambulatory Visit
Admission: RE | Admit: 2022-11-21 | Discharge: 2022-11-21 | Disposition: A | Discharge status: Home / Self Care | Payer: 59 | Source: Ambulatory Visit | Attending: Radiation Oncology | Admitting: Radiation Oncology

## 2022-11-21 DIAGNOSIS — C50112 Malignant neoplasm of central portion of left female breast: Secondary | ICD-10-CM | POA: Diagnosis not present

## 2022-11-21 LAB — RAD ONC ARIA SESSION SUMMARY
Course Elapsed Days: 27
Plan Fractions Treated to Date: 20
Plan Fractions Treated to Date: 20
Plan Prescribed Dose Per Fraction: 1.8 Gy
Plan Prescribed Dose Per Fraction: 1.8 Gy
Plan Total Fractions Prescribed: 28
Plan Total Fractions Prescribed: 28
Plan Total Prescribed Dose: 50.4 Gy
Plan Total Prescribed Dose: 50.4 Gy
Reference Point Dosage Given to Date: 36 Gy
Reference Point Dosage Given to Date: 36 Gy
Reference Point Session Dosage Given: 1.8 Gy
Reference Point Session Dosage Given: 1.8 Gy
Session Number: 20

## 2022-11-22 ENCOUNTER — Other Ambulatory Visit: Payer: Self-pay

## 2022-11-22 ENCOUNTER — Ambulatory Visit
Admission: RE | Admit: 2022-11-22 | Discharge: 2022-11-22 | Disposition: A | Discharge status: Home / Self Care | Payer: 59 | Source: Ambulatory Visit | Attending: Radiation Oncology | Admitting: Radiation Oncology

## 2022-11-22 DIAGNOSIS — C50112 Malignant neoplasm of central portion of left female breast: Secondary | ICD-10-CM | POA: Diagnosis not present

## 2022-11-22 LAB — RAD ONC ARIA SESSION SUMMARY
Course Elapsed Days: 28
Plan Fractions Treated to Date: 21
Plan Fractions Treated to Date: 21
Plan Prescribed Dose Per Fraction: 1.8 Gy
Plan Prescribed Dose Per Fraction: 1.8 Gy
Plan Total Fractions Prescribed: 28
Plan Total Fractions Prescribed: 28
Plan Total Prescribed Dose: 50.4 Gy
Plan Total Prescribed Dose: 50.4 Gy
Reference Point Dosage Given to Date: 37.8 Gy
Reference Point Dosage Given to Date: 37.8 Gy
Reference Point Session Dosage Given: 1.8 Gy
Reference Point Session Dosage Given: 1.8 Gy
Session Number: 21

## 2022-11-23 ENCOUNTER — Other Ambulatory Visit: Payer: Self-pay

## 2022-11-23 ENCOUNTER — Ambulatory Visit
Admission: RE | Admit: 2022-11-23 | Discharge: 2022-11-23 | Disposition: A | Discharge status: Home / Self Care | Payer: 59 | Source: Ambulatory Visit | Attending: Radiation Oncology | Admitting: Radiation Oncology

## 2022-11-23 DIAGNOSIS — C50112 Malignant neoplasm of central portion of left female breast: Secondary | ICD-10-CM | POA: Diagnosis not present

## 2022-11-23 LAB — RAD ONC ARIA SESSION SUMMARY
Course Elapsed Days: 29
Plan Fractions Treated to Date: 22
Plan Fractions Treated to Date: 22
Plan Prescribed Dose Per Fraction: 1.8 Gy
Plan Prescribed Dose Per Fraction: 1.8 Gy
Plan Total Fractions Prescribed: 28
Plan Total Fractions Prescribed: 28
Plan Total Prescribed Dose: 50.4 Gy
Plan Total Prescribed Dose: 50.4 Gy
Reference Point Dosage Given to Date: 39.6 Gy
Reference Point Dosage Given to Date: 39.6 Gy
Reference Point Session Dosage Given: 1.8 Gy
Reference Point Session Dosage Given: 1.8 Gy
Session Number: 22

## 2022-11-26 ENCOUNTER — Ambulatory Visit
Admission: RE | Admit: 2022-11-26 | Discharge: 2022-11-26 | Disposition: A | Discharge status: Home / Self Care | Payer: 59 | Source: Ambulatory Visit | Attending: Radiation Oncology | Admitting: Radiation Oncology

## 2022-11-26 ENCOUNTER — Other Ambulatory Visit: Payer: Self-pay

## 2022-11-26 DIAGNOSIS — C50112 Malignant neoplasm of central portion of left female breast: Secondary | ICD-10-CM | POA: Diagnosis not present

## 2022-11-26 LAB — RAD ONC ARIA SESSION SUMMARY
Course Elapsed Days: 32
Plan Fractions Treated to Date: 23
Plan Fractions Treated to Date: 23
Plan Prescribed Dose Per Fraction: 1.8 Gy
Plan Prescribed Dose Per Fraction: 1.8 Gy
Plan Total Fractions Prescribed: 28
Plan Total Fractions Prescribed: 28
Plan Total Prescribed Dose: 50.4 Gy
Plan Total Prescribed Dose: 50.4 Gy
Reference Point Dosage Given to Date: 41.4 Gy
Reference Point Dosage Given to Date: 41.4 Gy
Reference Point Session Dosage Given: 1.8 Gy
Reference Point Session Dosage Given: 1.8 Gy
Session Number: 23

## 2022-11-27 ENCOUNTER — Other Ambulatory Visit: Payer: Self-pay

## 2022-11-27 ENCOUNTER — Ambulatory Visit
Admission: RE | Admit: 2022-11-27 | Discharge: 2022-11-27 | Disposition: A | Discharge status: Home / Self Care | Payer: 59 | Source: Ambulatory Visit | Attending: Radiation Oncology | Admitting: Radiation Oncology

## 2022-11-27 DIAGNOSIS — C50112 Malignant neoplasm of central portion of left female breast: Secondary | ICD-10-CM | POA: Diagnosis not present

## 2022-11-27 LAB — RAD ONC ARIA SESSION SUMMARY
Course Elapsed Days: 33
Plan Fractions Treated to Date: 24
Plan Fractions Treated to Date: 24
Plan Prescribed Dose Per Fraction: 1.8 Gy
Plan Prescribed Dose Per Fraction: 1.8 Gy
Plan Total Fractions Prescribed: 28
Plan Total Fractions Prescribed: 28
Plan Total Prescribed Dose: 50.4 Gy
Plan Total Prescribed Dose: 50.4 Gy
Reference Point Dosage Given to Date: 43.2 Gy
Reference Point Dosage Given to Date: 43.2 Gy
Reference Point Session Dosage Given: 1.8 Gy
Reference Point Session Dosage Given: 1.8 Gy
Session Number: 24

## 2022-11-28 ENCOUNTER — Ambulatory Visit
Admission: RE | Admit: 2022-11-28 | Discharge: 2022-11-28 | Disposition: A | Discharge status: Home / Self Care | Payer: 59 | Source: Ambulatory Visit | Attending: Radiation Oncology | Admitting: Radiation Oncology

## 2022-11-28 ENCOUNTER — Other Ambulatory Visit: Payer: Self-pay

## 2022-11-28 DIAGNOSIS — C50112 Malignant neoplasm of central portion of left female breast: Secondary | ICD-10-CM | POA: Diagnosis not present

## 2022-11-28 LAB — RAD ONC ARIA SESSION SUMMARY
Course Elapsed Days: 34
Plan Fractions Treated to Date: 25
Plan Fractions Treated to Date: 25
Plan Prescribed Dose Per Fraction: 1.8 Gy
Plan Prescribed Dose Per Fraction: 1.8 Gy
Plan Total Fractions Prescribed: 28
Plan Total Fractions Prescribed: 28
Plan Total Prescribed Dose: 50.4 Gy
Plan Total Prescribed Dose: 50.4 Gy
Reference Point Dosage Given to Date: 45 Gy
Reference Point Dosage Given to Date: 45 Gy
Reference Point Session Dosage Given: 1.8 Gy
Reference Point Session Dosage Given: 1.8 Gy
Session Number: 25

## 2022-11-29 ENCOUNTER — Ambulatory Visit
Admission: RE | Admit: 2022-11-29 | Discharge: 2022-11-29 | Disposition: A | Discharge status: Home / Self Care | Payer: 59 | Source: Ambulatory Visit | Attending: Radiation Oncology | Admitting: Radiation Oncology

## 2022-11-29 ENCOUNTER — Other Ambulatory Visit: Payer: Self-pay

## 2022-11-29 DIAGNOSIS — C50112 Malignant neoplasm of central portion of left female breast: Secondary | ICD-10-CM | POA: Diagnosis not present

## 2022-11-29 LAB — RAD ONC ARIA SESSION SUMMARY
Course Elapsed Days: 35
Plan Fractions Treated to Date: 26
Plan Fractions Treated to Date: 26
Plan Prescribed Dose Per Fraction: 1.8 Gy
Plan Prescribed Dose Per Fraction: 1.8 Gy
Plan Total Fractions Prescribed: 28
Plan Total Fractions Prescribed: 28
Plan Total Prescribed Dose: 50.4 Gy
Plan Total Prescribed Dose: 50.4 Gy
Reference Point Dosage Given to Date: 46.8 Gy
Reference Point Dosage Given to Date: 46.8 Gy
Reference Point Session Dosage Given: 1.8 Gy
Reference Point Session Dosage Given: 1.8 Gy
Session Number: 26

## 2022-11-30 ENCOUNTER — Other Ambulatory Visit: Payer: Self-pay

## 2022-11-30 ENCOUNTER — Ambulatory Visit
Admission: RE | Admit: 2022-11-30 | Discharge: 2022-11-30 | Disposition: A | Discharge status: Home / Self Care | Payer: 59 | Source: Ambulatory Visit | Attending: Radiation Oncology | Admitting: Radiation Oncology

## 2022-11-30 DIAGNOSIS — C50112 Malignant neoplasm of central portion of left female breast: Secondary | ICD-10-CM | POA: Diagnosis not present

## 2022-11-30 LAB — RAD ONC ARIA SESSION SUMMARY
Course Elapsed Days: 36
Plan Fractions Treated to Date: 27
Plan Fractions Treated to Date: 27
Plan Prescribed Dose Per Fraction: 1.8 Gy
Plan Prescribed Dose Per Fraction: 1.8 Gy
Plan Total Fractions Prescribed: 28
Plan Total Fractions Prescribed: 28
Plan Total Prescribed Dose: 50.4 Gy
Plan Total Prescribed Dose: 50.4 Gy
Reference Point Dosage Given to Date: 48.6 Gy
Reference Point Dosage Given to Date: 48.6 Gy
Reference Point Session Dosage Given: 1.8 Gy
Reference Point Session Dosage Given: 1.8 Gy
Session Number: 27

## 2022-12-02 DIAGNOSIS — C50112 Malignant neoplasm of central portion of left female breast: Secondary | ICD-10-CM | POA: Diagnosis not present

## 2022-12-03 ENCOUNTER — Ambulatory Visit: Payer: 59

## 2022-12-03 NOTE — Progress Notes (Signed)
Kelli Estrada, counseling intern, met with patient for their scheduled counseling session.   The patient shared they did not make it to radiation today because they were not feeling well. The patient shared they felt better after resting.   The patient shared she is low on money this month and was in the process of refinancing a loan. The loan officer called while we were in session and said the change needed to happen by the end of the business day. We cut the session short so the patient could take care of her loan.   The counselor told the patient she would assist the patient in finding a new mental health counselor that takes her insurance next session.   The patient scheduled their next counseling session for Monday, Feb. 26th at [place],  Counseling Intern

## 2022-12-04 ENCOUNTER — Other Ambulatory Visit: Payer: Self-pay

## 2022-12-04 ENCOUNTER — Ambulatory Visit
Admission: RE | Admit: 2022-12-04 | Discharge: 2022-12-04 | Disposition: A | Discharge status: Home / Self Care | Payer: 59 | Source: Ambulatory Visit | Attending: Radiation Oncology | Admitting: Radiation Oncology

## 2022-12-04 ENCOUNTER — Telehealth: Payer: Self-pay | Admitting: *Deleted

## 2022-12-04 ENCOUNTER — Ambulatory Visit: Payer: 59

## 2022-12-04 DIAGNOSIS — C50112 Malignant neoplasm of central portion of left female breast: Secondary | ICD-10-CM | POA: Diagnosis not present

## 2022-12-04 DIAGNOSIS — Z170 Estrogen receptor positive status [ER+]: Secondary | ICD-10-CM

## 2022-12-04 LAB — RAD ONC ARIA SESSION SUMMARY
Course Elapsed Days: 40
Plan Fractions Treated to Date: 28
Plan Fractions Treated to Date: 28
Plan Prescribed Dose Per Fraction: 1.8 Gy
Plan Prescribed Dose Per Fraction: 1.8 Gy
Plan Total Fractions Prescribed: 28
Plan Total Fractions Prescribed: 28
Plan Total Prescribed Dose: 50.4 Gy
Plan Total Prescribed Dose: 50.4 Gy
Reference Point Dosage Given to Date: 50.4 Gy
Reference Point Dosage Given to Date: 50.4 Gy
Reference Point Session Dosage Given: 1.8 Gy
Reference Point Session Dosage Given: 1.8 Gy
Session Number: 28

## 2022-12-04 MED ORDER — SILVER SULFADIAZINE 1 % EX CREA: TOPICAL_CREAM | Freq: Every day | CUTANEOUS | Status: DC

## 2022-12-04 NOTE — Telephone Encounter (Signed)
Returned patient's phone call, lvm for a return call 

## 2022-12-05 ENCOUNTER — Telehealth: Payer: Self-pay | Admitting: *Deleted

## 2022-12-05 ENCOUNTER — Inpatient Hospital Stay (HOSPITAL_BASED_OUTPATIENT_CLINIC_OR_DEPARTMENT_OTHER): Payer: 59 | Admitting: Hematology and Oncology

## 2022-12-05 ENCOUNTER — Other Ambulatory Visit: Payer: Self-pay

## 2022-12-05 ENCOUNTER — Ambulatory Visit: Payer: 59

## 2022-12-05 ENCOUNTER — Telehealth: Payer: Self-pay | Admitting: Pharmacy Technician

## 2022-12-05 ENCOUNTER — Ambulatory Visit
Admission: RE | Admit: 2022-12-05 | Discharge: 2022-12-05 | Disposition: A | Discharge status: Home / Self Care | Payer: 59 | Source: Ambulatory Visit | Attending: Radiation Oncology | Admitting: Radiation Oncology

## 2022-12-05 ENCOUNTER — Other Ambulatory Visit (HOSPITAL_COMMUNITY): Payer: Self-pay

## 2022-12-05 VITALS — BP 106/72 | HR 75 | Temp 98.2°F | Resp 16 | Ht 63.0 in | Wt 258.1 lb

## 2022-12-05 DIAGNOSIS — Z79811 Long term (current) use of aromatase inhibitors: Secondary | ICD-10-CM | POA: Insufficient documentation

## 2022-12-05 DIAGNOSIS — Z90710 Acquired absence of both cervix and uterus: Secondary | ICD-10-CM | POA: Insufficient documentation

## 2022-12-05 DIAGNOSIS — C50112 Malignant neoplasm of central portion of left female breast: Secondary | ICD-10-CM

## 2022-12-05 DIAGNOSIS — Z801 Family history of malignant neoplasm of trachea, bronchus and lung: Secondary | ICD-10-CM | POA: Insufficient documentation

## 2022-12-05 DIAGNOSIS — Z803 Family history of malignant neoplasm of breast: Secondary | ICD-10-CM | POA: Insufficient documentation

## 2022-12-05 DIAGNOSIS — Z170 Estrogen receptor positive status [ER+]: Secondary | ICD-10-CM | POA: Insufficient documentation

## 2022-12-05 LAB — RAD ONC ARIA SESSION SUMMARY
Course Elapsed Days: 41
Plan Fractions Treated to Date: 1
Plan Prescribed Dose Per Fraction: 2 Gy
Plan Total Fractions Prescribed: 5
Plan Total Prescribed Dose: 10 Gy
Reference Point Dosage Given to Date: 2 Gy
Reference Point Session Dosage Given: 2 Gy
Session Number: 29

## 2022-12-05 MED ORDER — ABEMACICLIB 100 MG PO TABS
100.0000 mg | ORAL_TABLET | Freq: Two times a day (BID) | ORAL | 1 refills | Status: DC
  Filled 2022-12-05 – 2022-12-25 (×2): qty 56, 28d supply, fill #0
  Filled 2023-01-15: qty 56, 28d supply, fill #1

## 2022-12-05 MED ORDER — ANASTROZOLE 1 MG PO TABS: 1.0000 mg | ORAL_TABLET | Freq: Every day | ORAL | 3 refills | Status: DC

## 2022-12-05 NOTE — Assessment & Plan Note (Addendum)
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   Given early stage, strong ER/PR positivity and no evidence of lymph node involvement we have discussed about upfront surgery followed by Oncotype testing. We have discussed final pathology which showed 1 lymph node involvement malignancy. Oncotype resulted at 15,no benefit from chemotherapy.  We have reviewed that she should proceed with adjuvant radiation followed by antiestrogen therapy along with abemaciclib.   She is here while undergoing radiation. She is handling it reasonably well. We have discussed about starting anastrozole and abemaciclib , anticipate starting middle March. I have discussed about mechanism of action of anastrozole, adverse effects from this including but not limited to post menopausal symptoms, vaginal dryness and bone loss. With regards to abemaciclib, we discussed mechanism of action, adverse effects including but not limited to fatigue, cytopenias, hepatotoxicity, nausea, vomiting, dyspepsia etc. She is willing to try both of them.  RTC in mid April for tox check, ok to alternate between me and Jenny Reichmann our CPP

## 2022-12-05 NOTE — Telephone Encounter (Signed)
RETURNED PATIENT'S PHONE CALL, LVM FOR A RETURN CALL 

## 2022-12-05 NOTE — Telephone Encounter (Signed)
Oral Oncology Patient Advocate Encounter   Received notification that prior authorization for Verzenio is required.   PA submitted on 12/05/22 Key [id number]    Status is pending     Lady Deutscher, CPhT-Adv Oncology Pharmacy Patient [place] Direct Number: [phone]  Fax: [phone]

## 2022-12-05 NOTE — Progress Notes (Signed)
[facility] NOTE  Patient Care Team: Sherald Hess., MD as PCP - General (Family Medicine) Benay Pike, MD as Consulting Physician (Hematology and Oncology) Jovita Kussmaul, MD as Consulting Physician (General Surgery) Gery Pray, MD as Consulting Physician (Radiation Oncology) Mauro Kaufmann, RN as Oncology Nurse Navigator Rockwell Germany, RN as Oncology Nurse Navigator  CHIEF COMPLAINTS/PURPOSE OF CONSULTATION:  Breast cancer follow up.  HISTORY OF PRESENTING ILLNESS:  Kelli Estrada 62 y.o. female is here because of recent diagnosis of left breast cancer  I reviewed her records extensively and collaborated the history with the patient.  SUMMARY OF ONCOLOGIC HISTORY: Oncology History  Malignant neoplasm of central portion of left breast in female, estrogen receptor positive ([place])  07/18/2022 Imaging   Mammogram showed indeterminate left breast mass central to the nipple in the retroareolar region, indeterminate lymph node.  Ultrasound done showed 1.9 x 1.4 x 1.5 cm taller than wide irregular mass in the left breast highly suggestive of malignancy.  No significant abnormality seen in the left axilla   08/16/2022 Pathology Results   Pathology from the left breast mass showed grade 2 invasive ductal carcinoma ER 100% positive strong staining PR 90% positive strong staining Ki-67 of 10% and HER2 1+ by [facility]   08/20/2022 Initial Diagnosis   Malignant neoplasm of central portion of left breast in female, estrogen receptor positive ([place])   08/27/2022 Genetic Testing   Negative CustomNext-Cancer +RNAinsight Panel.  Report date is 08/29/2022.   The CustomNext-Cancer+RNAinsight panel offered by [facility] includes sequencing and rearrangement analysis for the following 47 genes:  APC, ATM, AXIN2, BARD1, BMPR1A, BRCA1, BRCA2, BRIP1, CDH1, CDK4, CDKN2A, CHEK2, DICER1, EPCAM, GREM1, HOXB13, MEN1, MLH1, MSH2, MSH3, MSH6, MUTYH, NBN, NF1, NF2, NTHL1, PALB2, PMS2,  POLD1, POLE, PTEN, RAD51C, RAD51D, RECQL, RET, SDHA, SDHAF2, SDHB, SDHC, SDHD, SMAD4, SMARCA4, STK11, TP53, TSC1, TSC2, and VHL.  RNA data is routinely analyzed for use in variant interpretation for all genes.   10/04/2022 Oncotype testing   Oncotype DX of 15, no role for adj chemo.    Interval History  Rest of the pertinent 10 point ROS reviewed and negative  MEDICAL HISTORY:  Past Medical History:  Diagnosis Date   Anxiety    Chronic pain    Depression    Family history of breast cancer 08/23/2022   H/O degenerative disc disease    Pre-diabetes    PTSD (post-traumatic stress disorder)    Sleep apnea     SURGICAL HISTORY: Past Surgical History:  Procedure Laterality Date   ABDOMINAL HYSTERECTOMY     BREAST LUMPECTOMY WITH SENTINEL LYMPH NODE BIOPSY Left 09/14/2022   Procedure: LEFT BREAST CENTRAL LUMPECTOMY WITH SENTINEL LYMPH NODE BX;  Surgeon: Jovita Kussmaul, MD;  Location: [facility];  Service: General;  Laterality: Left;   COLONOSCOPY WITH PROPOFOL N/A 03/15/2016   Procedure: COLONOSCOPY WITH PROPOFOL;  Surgeon: Teena Irani, MD;  Location: [facility];  Service: Endoscopy;  Laterality: N/A;   NECK SURGERY      SOCIAL HISTORY: Social History   Socioeconomic History   Marital status: Single    Spouse name: Not on file   Number of children: Not on file   Years of education: Not on file   Highest education level: Not on file  Occupational History   Not on file  Tobacco Use   Smoking status: Never   Smokeless tobacco: Not on file  Vaping Use   Vaping Use: Never used  Substance and Sexual Activity   Alcohol use: Not Currently    Comment: occasionally    Drug use: No   Sexual activity: Not Currently    Birth control/protection: Surgical  Other Topics Concern   Not on file  Social History Narrative   Not on file   Social Determinants of Health   Financial Resource Strain: Medium Risk (10/18/2022)   Overall Financial Resource Strain (CARDIA)     Difficulty of Paying Living Expenses: Somewhat hard  Food Insecurity: Not on file  Transportation Needs: Not on file  Physical Activity: Not on file  Stress: Not on file  Social Connections: Not on file  Intimate Partner Violence: Not on file    FAMILY HISTORY: Family History  Problem Relation Age of Onset   Lung cancer Mother        dx > 23   Breast cancer Maternal Grandmother 85    ALLERGIES:  has No Known Allergies.  MEDICATIONS:  Current Outpatient Medications  Medication Sig Dispense Refill   abemaciclib (VERZENIO) 100 MG tablet Take 1 tablet (100 mg total) by mouth 2 (two) times daily. 56 tablet 1   [START ON 12/31/2022] anastrozole (ARIMIDEX) 1 MG tablet Take 1 tablet (1 mg total) by mouth daily. 90 tablet 3   albuterol (VENTOLIN HFA) 108 (90 Base) MCG/ACT inhaler SMARTSIG:1 Puff(s) Via Inhaler 4 Times Daily PRN     alendronate (FOSAMAX) 70 MG tablet Take 70 mg by mouth once a week.     Blood Glucose Monitoring Suppl (ONETOUCH VERIO REFLECT) w/Device KIT See admin instructions.     [employer] CALCIUM 600 & VITAMIN D3 600-800 MG-UNIT TABS Take 1 tablet by mouth daily.     [employer] VITAMIN E 180 MG (400 UNIT) CAPS Take 2 capsules by mouth daily.     fenofibrate (TRICOR) 48 MG tablet Take 40 mg by mouth daily.     Fluticasone-Umeclidin-Vilant (TRELEGY ELLIPTA) 100-62.5-25 MCG/ACT AEPB Inhale 100 mcg into the lungs 1 day or 1 dose.     gabapentin (NEURONTIN) 300 MG capsule Take 300 mg by mouth 3 (three) times daily.     hydrOXYzine (VISTARIL) 25 MG capsule Take 25 mg by mouth 2 (two) times daily as needed for anxiety.   0   metFORMIN (GLUCOPHAGE) 500 MG tablet SMARTSIG:1 Tablet(s) By Mouth Every Evening     mirabegron ER (MYRBETRIQ) 25 MG TB24 tablet Take 1 tablet (25 mg total) by mouth daily. 30 tablet 11   omeprazole (PRILOSEC OTC) 20 MG tablet Take 20 mg by mouth daily.     Oxcarbazepine (TRILEPTAL) 300 MG tablet Take 300 mg by mouth 2 (two) times daily.     oxyCODONE-acetaminophen  (PERCOCET/ROXICET) 5-325 MG tablet Take 1 tablet by mouth every 6 (six) hours as needed (For pain.).      OZEMPIC, 1 MG/DOSE, 4 MG/3ML SOPN Inject 1 mg into the skin once a week.     rosuvastatin (CRESTOR) 20 MG tablet Take 20 mg by mouth at bedtime.     sertraline (ZOLOFT) 100 MG tablet Take 100 mg by mouth 2 (two) times daily.  2   tamsulosin (FLOMAX) 0.4 MG CAPS capsule TAKE 1 CAPSULE BY MOUTH EVERY DAY 90 capsule 1   topiramate (TOPAMAX) 100 MG tablet Take 100 mg by mouth daily.     No current facility-administered medications for this visit.    REVIEW OF SYSTEMS:   Constitutional: Denies fevers, chills or abnormal night sweats Eyes: Denies blurriness of vision, double vision or watery eyes  Ears, nose, mouth, throat, and face: Denies mucositis or sore throat Respiratory: Denies cough, dyspnea or wheezes Cardiovascular: Denies palpitation, chest discomfort or lower extremity swelling Gastrointestinal:  Denies nausea, heartburn or change in bowel habits Skin: Denies abnormal skin rashes Lymphatics: Denies new lymphadenopathy or easy bruising Neurological:Denies numbness, tingling or new weaknesses Behavioral/Psych: Mood is stable, no new changes  Breast: Denies any palpable lumps or discharge All other systems were reviewed with the patient and are negative.  PHYSICAL EXAMINATION: ECOG PERFORMANCE STATUS: 0 - Asymptomatic  Physical exam deferred today in lieu of a telephone visit  LABORATORY DATA:  I have reviewed the data as listed Lab Results  Component Value Date   WBC 4.7 08/22/2022   HGB 13.5 08/22/2022   HCT 40.1 08/22/2022   MCV 87.6 08/22/2022   PLT 157 08/22/2022   Lab Results  Component Value Date   NA 141 08/22/2022   K 3.9 08/22/2022   CL 109 08/22/2022   CO2 25 08/22/2022    RADIOGRAPHIC STUDIES: I have personally reviewed the radiological reports and agreed with the findings in the report.  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   Given early stage, strong ER/PR positivity and no evidence of lymph node involvement we have discussed about upfront surgery followed by Oncotype testing. We have discussed final pathology which showed 1 lymph node involvement malignancy. Oncotype resulted at 15,no benefit from chemotherapy.  We have reviewed that she should proceed with adjuvant radiation followed by antiestrogen therapy along with abemaciclib.   She is here while undergoing radiation. She is handling it reasonably well. We have discussed about starting anastrozole and abemaciclib , anticipate starting middle March. I have discussed about mechanism of action of anastrozole, adverse effects from this including but not limited to post menopausal symptoms, vaginal dryness and bone loss. With regards to abemaciclib, we discussed mechanism of action, adverse effects including but not limited to fatigue, cytopenias, hepatotoxicity, nausea, vomiting, dyspepsia etc. She is willing to try both of them.  RTC in mid April for tox check, ok to alternate between me and Jenny Reichmann our CPP   Total time spent: 30 minutes including history, physical exam, review of records, counseling and coordination of care All questions were answered. The patient knows to call the clinic with any problems, questions or concerns.    Benay Pike, MD 12/05/22

## 2022-12-05 NOTE — Telephone Encounter (Signed)
Oral Oncology Patient Advocate Encounter  Prior Authorization for Kelli Estrada has been approved.    PA# [id number] Effective dates: 12/05/22 through 10/15/23  Patients co-pay is $0.    Kelli Estrada, CPhT-Adv Oncology Pharmacy Patient [place] Direct Number: [phone]  Fax: [phone]

## 2022-12-06 ENCOUNTER — Telehealth: Payer: Self-pay

## 2022-12-06 ENCOUNTER — Other Ambulatory Visit: Payer: Self-pay

## 2022-12-06 ENCOUNTER — Ambulatory Visit
Admission: RE | Admit: 2022-12-06 | Discharge: 2022-12-06 | Disposition: A | Discharge status: Home / Self Care | Payer: 59 | Source: Ambulatory Visit | Attending: Radiation Oncology | Admitting: Radiation Oncology

## 2022-12-06 DIAGNOSIS — C50112 Malignant neoplasm of central portion of left female breast: Secondary | ICD-10-CM | POA: Diagnosis not present

## 2022-12-06 LAB — RAD ONC ARIA SESSION SUMMARY
Course Elapsed Days: 42
Plan Fractions Treated to Date: 2
Plan Prescribed Dose Per Fraction: 2 Gy
Plan Total Fractions Prescribed: 5
Plan Total Prescribed Dose: 10 Gy
Reference Point Dosage Given to Date: 4 Gy
Reference Point Session Dosage Given: 2 Gy
Session Number: 30

## 2022-12-06 NOTE — Telephone Encounter (Signed)
Oral Oncology Pharmacist Encounter  Received new prescription for abemaciclib (Verzenio) for the adjuvant treatment of ER positive, HER2 negative breast cancer in conjunction with anastrozole, planned duration until disease progression or unacceptable toxicity or for 2 years.  Prescription dose and frequency assessed for appropriateness. Last labs drawn from 08/22/22- will discuss with MD.   Current medication list in Epic reviewed, DDIs with Verzenio identified: - metformin: verzenio may increase concentration of metformin. Will notify patient to monitor.   Evaluated chart and no patient barriers to medication adherence noted.   Patient agreement for treatment documented in MD note on 2/.  Prescription has been e-scribed to the [facility] for benefits analysis and approval.  Oral Oncology Clinic will continue to follow for insurance authorization, copayment issues, initial counseling and start date.  Drema Halon, PharmD Hematology/Oncology Clinical Pharmacist [facility] [phone] 12/06/2022 12:27 PM

## 2022-12-07 ENCOUNTER — Ambulatory Visit
Admission: RE | Admit: 2022-12-07 | Discharge: 2022-12-07 | Disposition: A | Discharge status: Home / Self Care | Payer: 59 | Source: Ambulatory Visit | Attending: Radiation Oncology | Admitting: Radiation Oncology

## 2022-12-07 ENCOUNTER — Other Ambulatory Visit: Payer: Self-pay

## 2022-12-07 DIAGNOSIS — C50112 Malignant neoplasm of central portion of left female breast: Secondary | ICD-10-CM | POA: Diagnosis not present

## 2022-12-07 LAB — RAD ONC ARIA SESSION SUMMARY
Course Elapsed Days: 43
Plan Fractions Treated to Date: 3
Plan Prescribed Dose Per Fraction: 2 Gy
Plan Total Fractions Prescribed: 5
Plan Total Prescribed Dose: 10 Gy
Reference Point Dosage Given to Date: 6 Gy
Reference Point Session Dosage Given: 2 Gy
Session Number: 31

## 2022-12-10 ENCOUNTER — Other Ambulatory Visit: Payer: Self-pay

## 2022-12-10 ENCOUNTER — Ambulatory Visit
Admission: RE | Admit: 2022-12-10 | Discharge: 2022-12-10 | Disposition: A | Discharge status: Home / Self Care | Payer: 59 | Source: Ambulatory Visit | Attending: Radiation Oncology | Admitting: Radiation Oncology

## 2022-12-10 ENCOUNTER — Telehealth: Payer: Self-pay | Admitting: Hematology and Oncology

## 2022-12-10 ENCOUNTER — Encounter: Payer: Self-pay | Admitting: *Deleted

## 2022-12-10 ENCOUNTER — Inpatient Hospital Stay: Payer: 59 | Admitting: Licensed Clinical Social Worker

## 2022-12-10 DIAGNOSIS — Z170 Estrogen receptor positive status [ER+]: Secondary | ICD-10-CM

## 2022-12-10 DIAGNOSIS — C50112 Malignant neoplasm of central portion of left female breast: Secondary | ICD-10-CM | POA: Diagnosis not present

## 2022-12-10 LAB — RAD ONC ARIA SESSION SUMMARY
Course Elapsed Days: 46
Plan Fractions Treated to Date: 4
Plan Prescribed Dose Per Fraction: 2 Gy
Plan Total Fractions Prescribed: 5
Plan Total Prescribed Dose: 10 Gy
Reference Point Dosage Given to Date: 8 Gy
Reference Point Session Dosage Given: 2 Gy
Session Number: 32

## 2022-12-10 NOTE — Progress Notes (Signed)
Kelli Estrada, counseling intern, met with patient for their scheduled counseling session.   The patient shared they often feel overwhelmed with their long to do list. The patient reflected when they look at it their list their brain feels fried.   The counselor and patient worked to list out three obtainable goals the patient can complete before the next session. The patient plans to:   [facility] their radiation treatment.  Take their son, Annie Main, to get his blood work.  Take care of the dirty dishes on the back porch.   The patient also plans to call resources from the list provided by the counselor to find a new therapist in lieu of the counselor leaving [facility].   The patient scheduled their next counseling session for Thursday, March 14th at [place],  Counseling Intern  [phone] [email or web address]

## 2022-12-10 NOTE — Telephone Encounter (Signed)
Reached out to reschedule patient per [place] being out of the office, left message for patient,

## 2022-12-10 NOTE — Progress Notes (Signed)
[place] CSW Progress Note  TC from patient to follow up on resources. CSW updated on expected timeline for [employer] application which should be in about 3-10 days.  Also discussed contacting DSS about Medicaid gas reimbursement.    Patient then asked about two items that she needs from MD and forgot to ask about at her last appointment. CSW sent message to MD & RN re: request for letter with diagnosis and treatment information as well as CAP forms.    Yevette Knust E Derik Fults, LCSW

## 2022-12-11 ENCOUNTER — Encounter: Payer: Self-pay | Admitting: Radiation Oncology

## 2022-12-11 ENCOUNTER — Other Ambulatory Visit: Payer: Self-pay

## 2022-12-11 ENCOUNTER — Ambulatory Visit
Admission: RE | Admit: 2022-12-11 | Discharge: 2022-12-11 | Disposition: A | Discharge status: Home / Self Care | Payer: 59 | Source: Ambulatory Visit | Attending: Radiation Oncology | Admitting: Radiation Oncology

## 2022-12-11 DIAGNOSIS — C50112 Malignant neoplasm of central portion of left female breast: Secondary | ICD-10-CM | POA: Diagnosis not present

## 2022-12-11 LAB — RAD ONC ARIA SESSION SUMMARY
Course Elapsed Days: 47
Plan Fractions Treated to Date: 5
Plan Prescribed Dose Per Fraction: 2 Gy
Plan Total Fractions Prescribed: 5
Plan Total Prescribed Dose: 10 Gy
Reference Point Dosage Given to Date: 10 Gy
Reference Point Session Dosage Given: 2 Gy
Session Number: 33

## 2022-12-11 NOTE — Progress Notes (Incomplete)
  Radiation Oncology         ([phone]) [phone] ________________________________  Name: Kelli Estrada MRN: [id number]  Date: 12/11/2022  DOB: 07-10-1961  End of Treatment Note  Diagnosis: Clinical Stage IA (cT1, cN0, cM0) Central Left Breast, Invasive Ductal Carcinoma, ER+ / PR+ / Her2-, Grade 2 : s/p lumpectomy and SLN biopsies with 1 positive node and clean margins      Indication for treatment:  Curative        Radiation treatment dates: 10/25/22 through 12/11/22  Site/dose:  Left breast treated with 50.4 Gy in 28 Fx, at 1.8 Gy/Fx + Left Breast Boost with 10 Gy in 5 Fx, at 2 Gy/Fx      Beams/energy: 15X, 10X  Narrative: The patient tolerated radiation treatment relatively well.  On the date of her final treatment, the patient endorsed pain rated at a 6/10, fatigue, skin redness, and blistering. Physical exam performed on the date of her final treatment showed some erythema and hyperpigmentation changes to the left breast area. No moist desquamation in the inframammary fold area was appreciated.  Plan: The patient has completed radiation treatment. The patient will return to radiation oncology clinic for routine followup in one month or sooner if her skin reaction becomes more significant. -----------------------------------  Blair Promise, PhD, MD  This document serves as a record of services personally performed by Gery Pray, MD. It was created on his behalf by Roney Mans, a trained medical scribe. The creation of this record is based on the scribe's personal observations and the provider's statements to them. This document has been checked and approved by the attending provider.

## 2022-12-13 ENCOUNTER — Telehealth: Payer: Self-pay

## 2022-12-13 NOTE — Telephone Encounter (Signed)
Kelli Estrada, counseling intern, called patient to change meeting time.   New meeting time is to Monday, March 11th at [place],  Counseling Intern  [phone] [email or web address]

## 2022-12-13 NOTE — Radiation Completion Notes (Signed)
Patient Name: Kelli Estrada, Kelli Estrada MRN: [id number] Date of Birth: [birth year] Referring Physician: Amedeo Kinsman., M.D. Date of Service: 2022-12-13 Radiation Oncologist: Teryl Lucy, M.D. [place] END OF TREATMENT NOTE     Diagnosis: C50.112 Malignant neoplasm of central portion of left female breast Staging on 2022-08-22: Malignant neoplasm of central portion of left breast in female, estrogen receptor positive ([place]) T=cT1, N=cN0, M=cM0 Intent: Curative     ==========DELIVERED PLANS==========  First Treatment Date: 2022-10-25 - Last Treatment Date: 2022-12-11   Plan Name: Breast_L_BH Site: Breast, Left Technique: 3D Mode: Photon Dose Per Fraction: 1.8 Gy Prescribed Dose (Delivered / Prescribed): 50.4 Gy / 50.4 Gy Prescribed Fxs (Delivered / Prescribed): 28 / 28   Plan Name: Brst_L_SCV_BH Site: Breast, Left Technique: 3D Mode: Photon Dose Per Fraction: 1.8 Gy Prescribed Dose (Delivered / Prescribed): 50.4 Gy / 50.4 Gy Prescribed Fxs (Delivered / Prescribed): 28 / 28   Plan Name: Brst_L_Bst_BH Site: Breast, Left Technique: 3D Mode: Photon Dose Per Fraction: 2 Gy Prescribed Dose (Delivered / Prescribed): 10 Gy / 10 Gy Prescribed Fxs (Delivered / Prescribed): 5 / 5     ==========ON TREATMENT VISIT DATES========== 2022-10-30, 2022-11-06, 2022-11-13, 2022-11-20, 2022-11-27, 2022-12-04, 2022-12-11     ==========UPCOMING VISITS==========       ==========APPENDIX - ON TREATMENT VISIT NOTES==========   See weekly On Treatment Notes is Epic for details.

## 2022-12-19 ENCOUNTER — Encounter: Payer: Self-pay | Admitting: *Deleted

## 2022-12-21 ENCOUNTER — Ambulatory Visit: Payer: 59 | Admitting: Urology

## 2022-12-24 NOTE — Progress Notes (Signed)
Lysle Morales, counseling intern, met with patient for the patient's scheduled counseling session.   The patient shared they had a stressful day because of a miscommunication with their primary care doctor regarding pain management and prescriptions. The patient shared, in the end, everything was figured out.   The patient shared they have a long list of to [place], including organizing their finances and working on organizing their room. The counselor reminded the patient to take small steps to reach big goals.   The patient plans on contacting their insurance regarding making a counseling appointment with another counselor by our next session.   The patient scheduled their last counseling session for Thursday, March 28th at [facility],  Counseling Intern  [phone] [email or web address]

## 2022-12-25 ENCOUNTER — Other Ambulatory Visit (HOSPITAL_COMMUNITY): Payer: Self-pay

## 2022-12-25 ENCOUNTER — Telehealth: Payer: Self-pay

## 2022-12-25 NOTE — Telephone Encounter (Addendum)
Oral Chemotherapy Student Pharmacist Encounter  I spoke with patient for overview of: Verzenio for the treatment of HER2(-), hormone-receptor positive breast cancer, in combination with anastrozole, planned duration until disease progression or unacceptable toxicity.   Counseled patient on administration, dosing, side effects, monitoring, drug-food interactions, safe handling, storage, and disposal.  Patient will take Verzenio 100 mg tablets, 1 tablet by mouth twice daily without regard to food.  Patient knows to avoid grapefruit and grapefruit juice.  Patient is taking anastrozole 1 mg once daily with Verzenio.  Verzenio start date: 12/28/2022  Adverse effects include but are not limited to: diarrhea, fatigue, nausea, abdominal pain, decreased blood counts, and increased liver function tests, and joint pains. Severe, life-threatening, and/or fatal interstitial lung disease (ILD) and/or pneumonitis may occur with CDK 4/6 inhibitors.  Patient does not have anti-emetic on hand, but knows to call if nausea develops.   Patient will obtain anti diarrheal and alert the office of 4 or more loose stools above baseline.  Reviewed with patient importance of keeping a medication schedule and plan for any missed doses. No barriers to medication adherence identified.  Medication reconciliation performed and medication/allergy list updated.  Insurance authorization for [employer] has been obtained. Test claim at the pharmacy revealed copayment $0 for 1st fill of Verzenio. This will ship from the [facility] outpatient pharmacy on 12/26/2022 to deliver to patient's home on 12/27/2022.  Patient informed the pharmacy will reach out 5-7 days prior to needing next fill of Verzenio to coordinate continued medication acquisition to prevent break in therapy.  All questions answered.  [place] voiced understanding and appreciation.   Medication education handout placed in mail for patient. Patient knows to call  the office with questions or concerns. Oral Chemotherapy Clinic phone number provided to patient.   Juluis Mire, PharmD Candidate 12/25/2022   10:41 AM Oral Oncology Clinic [phone]

## 2022-12-28 ENCOUNTER — Encounter: Payer: Self-pay | Admitting: *Deleted

## 2022-12-28 ENCOUNTER — Inpatient Hospital Stay: Payer: 59 | Attending: Radiation Oncology | Admitting: Licensed Clinical Social Worker

## 2022-12-28 NOTE — Progress Notes (Signed)
[place] CSW Progress Note  Clinical Education officer, museum received TC from patient asking about status of Retail buyer as she has not received a response yet. CSW sent secure message to Lacy Duverney requesting an update.  Patient also interested in [facility] class. CSW signed pt up today and reminded of class dates and times.    Rosalie Gelpi E Gaylon Bentz, LCSW

## 2022-12-29 ENCOUNTER — Telehealth: Payer: Self-pay | Admitting: Adult Health

## 2022-12-29 NOTE — Telephone Encounter (Signed)
Scheduled appointment per 3/15 scheduling message. Patient is aware of the made appointment.

## 2022-12-31 NOTE — Progress Notes (Signed)
Received notice from [employer] that pt's application is in final stages and should be mailed either this or next Friday. Called pt to update her and she confirmed that she received email stating she was approved this morning.   Kelli Estrada E Gaynell Eggleton, LCSW

## 2023-01-07 ENCOUNTER — Encounter: Payer: Self-pay | Admitting: Radiation Oncology

## 2023-01-10 ENCOUNTER — Telehealth: Payer: Self-pay | Admitting: *Deleted

## 2023-01-10 ENCOUNTER — Ambulatory Visit
Admission: RE | Admit: 2023-01-10 | Discharge: 2023-01-10 | Disposition: A | Discharge status: Home / Self Care | Payer: 59 | Source: Ambulatory Visit | Attending: Radiation Oncology | Admitting: Radiation Oncology

## 2023-01-10 HISTORY — DX: Personal history of irradiation: Z92.3

## 2023-01-10 NOTE — Telephone Encounter (Signed)
CALLED PATIENT TO INQUIRE IF SHE IS COMING FOR TODAY'S FU, PATIENT OPTED TO CANCEL AND RESCHEDULE FOR 01-17-23

## 2023-01-10 NOTE — Progress Notes (Signed)
Lysle Morales, counseling intern, met with patient for their scheduled counseling session.   The patient shared they had a "chaotic" day with a lot of appointments. The patient shared they are helping care for people in their home. The patient shared they are planning to organize their room and then organize their schedule.   This was the last session between the patient and counselor. The patient was given resources for other counselors in case needs arise.   Lysle Morales,  Counseling Intern  [phone] [email or web address]

## 2023-01-15 ENCOUNTER — Other Ambulatory Visit (HOSPITAL_COMMUNITY): Payer: Self-pay

## 2023-01-15 ENCOUNTER — Other Ambulatory Visit: Payer: Self-pay

## 2023-01-16 ENCOUNTER — Encounter: Payer: Self-pay | Admitting: Radiation Oncology

## 2023-01-16 ENCOUNTER — Telehealth: Payer: Self-pay | Admitting: *Deleted

## 2023-01-16 NOTE — Telephone Encounter (Signed)
Called patient to alter fu appt. for 01-17-23 due to Dr. Sondra Come having an appt., appt. has been rescheduled for 01-17-23 @ 9 am, patient agreed  to new time

## 2023-01-16 NOTE — Progress Notes (Signed)
Radiation Oncology         [phone] ________________________________  Name: Kelli Estrada MRN: [id number]  Date: 01/17/2023  DOB: 1960/12/26  Follow-Up Visit Note  CC: Sherald Hess., MD  Benay Pike, MD  No diagnosis found.  Diagnosis: Clinical Stage IA (cT1, cN0, cM0) Central Left Breast, Invasive Ductal Carcinoma, ER+ / PR+ / Her2-, Grade 2 : s/p lumpectomy and SLN biopsies with 1 positive node and clean margins   Interval Since Last Radiation: 1 month and 8 days   Intent: Curative  Radiation Treatment Dates: 10/25/2022 through 12/11/2022 Site Technique Total Dose (Gy) Dose per Fx (Gy) Completed Fx Beam Energies  Breast, Left: Breast_L 3D 50.4/50.4 1.8 28/28 15X  Breast, Left: Breast_L_SCV_PAB 3D 50.4/50.4 1.8 28/28 10X, 15X  Breast, Left: Breast_L_Bst 3D 10/10 2 5/5 6X, 15X   Narrative:  The patient returns today for routine follow-up. The patient tolerated radiation therapy relatively well. On the date of her final treatment, the patient reported pain rated at a 6/10, fatigue, redness, and blistering. Physical exam performed on that same date showed some erythema and hyperpigmentation changes to the left breast area. No moist desquamation in the inframammary fold was appreciated.   The patient most recent followed up with Dr. Chryl Heck on 12/05/22. During which time, the patient consented to starting antiestrogen therapy consisting of anastrozole and abemaciclib following radiation therapy.    No other significant interval history since the patient was seen in consultation in January.   ***                              Allergies:  has No Known Allergies.  Meds: Current Outpatient Medications  Medication Sig Dispense Refill   abemaciclib (VERZENIO) 100 MG tablet Take 1 tablet (100 mg total) by mouth 2 (two) times daily. 56 tablet 1   albuterol (VENTOLIN HFA) 108 (90 Base) MCG/ACT inhaler SMARTSIG:1 Puff(s) Via Inhaler 4 Times Daily PRN     alendronate (FOSAMAX) 70 MG  tablet Take 70 mg by mouth once a week.     anastrozole (ARIMIDEX) 1 MG tablet Take 1 tablet (1 mg total) by mouth daily. 90 tablet 3   Blood Glucose Monitoring Suppl (ONETOUCH VERIO REFLECT) w/Device KIT See admin instructions.     [employer] CALCIUM 600 & VITAMIN D3 600-800 MG-UNIT TABS Take 1 tablet by mouth daily.     CVS VITAMIN E 180 MG (400 UNIT) CAPS Take 2 capsules by mouth daily.     fenofibrate (TRICOR) 48 MG tablet Take 40 mg by mouth daily.     Fluticasone-Umeclidin-Vilant (TRELEGY ELLIPTA) 100-62.5-25 MCG/ACT AEPB Inhale 100 mcg into the lungs 1 day or 1 dose.     gabapentin (NEURONTIN) 300 MG capsule Take 300 mg by mouth 3 (three) times daily.     hydrOXYzine (VISTARIL) 25 MG capsule Take 25 mg by mouth 2 (two) times daily as needed for anxiety.   0   metFORMIN (GLUCOPHAGE) 500 MG tablet SMARTSIG:1 Tablet(s) By Mouth Every Evening     mirabegron ER (MYRBETRIQ) 25 MG TB24 tablet Take 1 tablet (25 mg total) by mouth daily. 30 tablet 11   omeprazole (PRILOSEC OTC) 20 MG tablet Take 20 mg by mouth daily.     Oxcarbazepine (TRILEPTAL) 300 MG tablet Take 300 mg by mouth 2 (two) times daily.     oxyCODONE-acetaminophen (PERCOCET/ROXICET) 5-325 MG tablet Take 1 tablet by mouth every 6 (six) hours as needed (For  pain.).      OZEMPIC, 1 MG/DOSE, 4 MG/3ML SOPN Inject 1 mg into the skin once a week.     rosuvastatin (CRESTOR) 20 MG tablet Take 20 mg by mouth at bedtime.     sertraline (ZOLOFT) 100 MG tablet Take 100 mg by mouth 2 (two) times daily.  2   tamsulosin (FLOMAX) 0.4 MG CAPS capsule TAKE 1 CAPSULE BY MOUTH EVERY DAY 90 capsule 1   topiramate (TOPAMAX) 100 MG tablet Take 100 mg by mouth daily.     No current facility-administered medications for this encounter.    Physical Findings: The patient is in no acute distress. Patient is alert and oriented.  vitals were not taken for this visit. .  No significant changes. Lungs are clear to auscultation bilaterally. Heart has regular rate and  rhythm. No palpable cervical, supraclavicular, or axillary adenopathy. Abdomen soft, non-tender, normal bowel sounds.  Right Breast: no palpable mass, nipple discharge or bleeding. Left Breast: ***  Lab Findings: Lab Results  Component Value Date   WBC 4.7 08/22/2022   HGB 13.5 08/22/2022   HCT 40.1 08/22/2022   MCV 87.6 08/22/2022   PLT 157 08/22/2022    Radiographic Findings: No results found.  Impression: Clinical Stage IA (cT1, cN0, cM0) Central Left Breast, Invasive Ductal Carcinoma, ER+ / PR+ / Her2-, Grade 2 : s/p lumpectomy and SLN biopsies with 1 positive node and clean margins   The patient is recovering from the effects of radiation.  ***  Plan:  ***   *** minutes of total time was spent for this patient encounter, including preparation, face-to-face counseling with the patient and coordination of care, physical exam, and documentation of the encounter. ____________________________________  Blair Promise, PhD, MD  This document serves as a record of services personally performed by Gery Pray, MD. It was created on his behalf by Roney Mans, a trained medical scribe. The creation of this record is based on the scribe's personal observations and the provider's statements to them. This document has been checked and approved by the attending provider.

## 2023-01-17 ENCOUNTER — Inpatient Hospital Stay: Payer: 59 | Attending: Hematology and Oncology | Admitting: Nutrition

## 2023-01-17 ENCOUNTER — Encounter: Payer: Self-pay | Admitting: Radiation Oncology

## 2023-01-17 ENCOUNTER — Ambulatory Visit
Admission: RE | Admit: 2023-01-17 | Discharge: 2023-01-17 | Disposition: A | Discharge status: Home / Self Care | Payer: 59 | Source: Ambulatory Visit | Attending: Radiation Oncology | Admitting: Radiation Oncology

## 2023-01-17 VITALS — BP 120/84 | HR 74 | Temp 96.8°F | Resp 20 | Ht 63.0 in | Wt 257.2 lb

## 2023-01-17 DIAGNOSIS — Z79811 Long term (current) use of aromatase inhibitors: Secondary | ICD-10-CM | POA: Insufficient documentation

## 2023-01-17 DIAGNOSIS — R112 Nausea with vomiting, unspecified: Secondary | ICD-10-CM | POA: Insufficient documentation

## 2023-01-17 DIAGNOSIS — Z170 Estrogen receptor positive status [ER+]: Secondary | ICD-10-CM | POA: Insufficient documentation

## 2023-01-17 DIAGNOSIS — C50112 Malignant neoplasm of central portion of left female breast: Secondary | ICD-10-CM | POA: Insufficient documentation

## 2023-01-17 DIAGNOSIS — Z923 Personal history of irradiation: Secondary | ICD-10-CM | POA: Insufficient documentation

## 2023-01-17 NOTE — Progress Notes (Addendum)
Donesha Duprey is here today for follow up post radiation to the breast.   Breast Side: Left   They completed their radiation on:  12/11/22  Does the patient complain of any of the following: Post radiation skin issues:  Continues to have itching to left breast.  Breast Tenderness: yes to left axilla.  Breast Swelling: No Lymphadema: No Range of Motion limitations: No  Fatigue post radiation: Yes Appetite good/fair/poor: Fair, appetite comes and goes.   Additional comments if applicable:   BP [id number] (BP Location: Left Arm, Patient Position: Sitting)   Pulse 74   Temp (!) 96.8 F (36 C) (Temporal)   Resp 20   Ht 5\' 3"  (1.6 m)   Wt 257 lb 4 oz (116.7 kg)   SpO2 99%   BMI 45.57 kg/m

## 2023-01-17 NOTE — Progress Notes (Signed)
Notified by staff patient was requesting samples of Ensure.  I spoke briefly with patient who indicated she is trying to improve her nutrition.  She likes to drink Ensure for the added vitamins and minerals.  Noted financial insecurity.  Patient states she is waiting for benefits to come in before she can purchase more Ensure.  Patient has received 1 food bag of groceries and 1 bag of paper and toiletry products today.  Patient noted she is living with her daughter at this time.  Patient's weight is stable.  She currently weighs 257 pounds 4 ounces.  BMI is 45.58 indicating morbid obesity.  Patient has completed radiation therapy and will start on antiestrogen therapy.  Patient is interested in additional nutrition education to help her make healthier food choices.  Provided samples of Ensure max and original Ensure.  Patient prefers chocolate flavor.  I also provided multiple coupons for her to use once she needs to use her food stamps.  I will schedule a nutrition appointment to provide further education on healthy foods after cancer treatment.  Patient was appreciative of samples and education.  **Disclaimer: This note was dictated with voice recognition software. Similar sounding words can inadvertently be transcribed and this note may contain transcription errors which may not have been corrected upon publication of note.**

## 2023-01-17 NOTE — Progress Notes (Signed)
  Radiation Oncology         [phone] ________________________________  Patient Name: Kelli Estrada MRN: [id number] DOB: 11/20/1960 Referring Physician: IRUKU PRAVEENA Date of Service: 12/11/2022  [facility] [place]                                                        End Of Treatment Note  Diagnoses: C50.112-Malignant neoplasm of central portion of left female breast  Cancer Staging: Clinical Stage IA (cT1, cN0, cM0) Central Left Breast, Invasive Ductal Carcinoma, ER+ / PR+ / Her2-, Grade 2 : s/p lumpectomy and SLN biopsies with 1 positive node and clean margins   Intent: Curative  Radiation Treatment Dates: 10/25/2022 through 12/11/2022 Site Technique Total Dose (Gy) Dose per Fx (Gy) Completed Fx Beam Energies  Breast, Left: Breast_L 3D 50.4/50.4 1.8 28/28 15X  Breast, Left: Breast_L_SCV_PAB 3D 50.4/50.4 1.8 28/28 10X, 15X  Breast, Left: Breast_L_Bst 3D 10/10 2 5/5 6X, 15X   Narrative: The patient tolerated radiation therapy relatively well. On the date of her final treatment, the patient reported pain rated at a 6/10, fatigue, redness, and blistering. Physical exam performed on that same date showed some erythema and hyperpigmentation changes to the left breast area. No moist desquamation in the inframammary fold was appreciated.   Plan: The patient will follow-up with radiation oncology in one month .  ________________________________________________ -----------------------------------  Devonn Giampietro D. Dinita Migliaccio, PhD, MD  This document serves as a record of services personally performed by Jayden Rudge, MD. It was created on his behalf by Elisa Frazier, a trained medical scribe. The creation of this record is based on the scribe's personal observations and the provider's statements to them. This document has been checked and approved by the attending provider.  

## 2023-01-21 ENCOUNTER — Other Ambulatory Visit: Payer: Self-pay

## 2023-01-21 ENCOUNTER — Telehealth: Payer: Self-pay

## 2023-01-21 DIAGNOSIS — N201 Calculus of ureter: Secondary | ICD-10-CM

## 2023-01-21 DIAGNOSIS — N3281 Overactive bladder: Secondary | ICD-10-CM

## 2023-01-21 NOTE — Telephone Encounter (Signed)
Tried calling patient for Rx refill Tamsulosin 0.4mg . Will follow up

## 2023-01-22 ENCOUNTER — Other Ambulatory Visit (HOSPITAL_COMMUNITY): Payer: Self-pay

## 2023-01-22 MED ORDER — TAMSULOSIN HCL 0.4 MG PO CAPS
0.4000 mg | ORAL_CAPSULE | Freq: Every day | ORAL | 2 refills | Status: DC
Start: 2023-01-22 — End: 2023-02-05

## 2023-01-22 NOTE — Telephone Encounter (Signed)
Follow up with patient. Patient is aware that Rx. Flomax have been sent to her pharmacy. Patient voiced understanding

## 2023-01-24 ENCOUNTER — Encounter: Payer: Self-pay | Admitting: Dietician

## 2023-01-24 NOTE — Progress Notes (Unsigned)
Patient participated in the Nutrition and Cancer webinar. AICR guidelines were reviewed.  [facility] nutrition resources for survivors were encouraged (Nutrition 101, cooking classes, individual MNT with outpatient RDs in weight management and DM management).  Contact information provided.  Emailed tip sheet for Breast Cancer Survivors and [employer].  Gennaro Africa, RDN, LDN Registered Dietitian, [facility] Part Time Remote (Usual office hours: Tuesday-Thursday) Mobile: [phone] Remote Office: [phone]

## 2023-01-29 ENCOUNTER — Inpatient Hospital Stay: Payer: 59 | Admitting: Pharmacist

## 2023-01-29 ENCOUNTER — Inpatient Hospital Stay: Payer: 59 | Admitting: Dietician

## 2023-01-29 ENCOUNTER — Inpatient Hospital Stay: Payer: 59

## 2023-01-29 VITALS — BP 128/71 | HR 67 | Temp 97.9°F | Resp 18 | Ht 63.0 in | Wt 262.8 lb

## 2023-01-29 DIAGNOSIS — Z170 Estrogen receptor positive status [ER+]: Secondary | ICD-10-CM | POA: Diagnosis not present

## 2023-01-29 DIAGNOSIS — R112 Nausea with vomiting, unspecified: Secondary | ICD-10-CM | POA: Diagnosis not present

## 2023-01-29 DIAGNOSIS — C50112 Malignant neoplasm of central portion of left female breast: Secondary | ICD-10-CM | POA: Diagnosis present

## 2023-01-29 DIAGNOSIS — Z79811 Long term (current) use of aromatase inhibitors: Secondary | ICD-10-CM | POA: Diagnosis not present

## 2023-01-29 LAB — CBC WITH DIFFERENTIAL (CANCER CENTER ONLY)
Abs Immature Granulocytes: 0.01 10*3/uL (ref 0.00–0.07)
Basophils Absolute: 0 10*3/uL (ref 0.0–0.1)
Basophils Relative: 1 %
Eosinophils Absolute: 0.1 10*3/uL (ref 0.0–0.5)
Eosinophils Relative: 3 %
HCT: 37.7 % (ref 36.0–46.0)
Hemoglobin: 12.3 g/dL (ref 12.0–15.0)
Immature Granulocytes: 0 %
Lymphocytes Relative: 32 %
Lymphs Abs: 0.9 10*3/uL (ref 0.7–4.0)
MCH: 29.8 pg (ref 26.0–34.0)
MCHC: 32.6 g/dL (ref 30.0–36.0)
MCV: 91.3 fL (ref 80.0–100.0)
Monocytes Absolute: 0.3 10*3/uL (ref 0.1–1.0)
Monocytes Relative: 10 %
Neutro Abs: 1.5 10*3/uL — ABNORMAL LOW (ref 1.7–7.7)
Neutrophils Relative %: 54 %
Platelet Count: 124 10*3/uL — ABNORMAL LOW (ref 150–400)
RBC: 4.13 MIL/uL (ref 3.87–5.11)
RDW: 14.3 % (ref 11.5–15.5)
WBC Count: 2.8 10*3/uL — ABNORMAL LOW (ref 4.0–10.5)
nRBC: 0 % (ref 0.0–0.2)

## 2023-01-29 LAB — CMP (CANCER CENTER ONLY)
ALT: 13 U/L (ref 0–44)
AST: 12 U/L — ABNORMAL LOW (ref 15–41)
Albumin: 3.7 g/dL (ref 3.5–5.0)
Alkaline Phosphatase: 79 U/L (ref 38–126)
Anion gap: 5 (ref 5–15)
BUN: 11 mg/dL (ref 6–20)
CO2: 28 mmol/L (ref 22–32)
Calcium: 9 mg/dL (ref 8.9–10.3)
Chloride: 106 mmol/L (ref 98–111)
Creatinine: 0.79 mg/dL (ref 0.44–1.00)
GFR, Estimated: >60 mL/min (ref >60)
Glucose, Bld: 93 mg/dL (ref 70–99)
Potassium: 3.7 mmol/L (ref 3.5–5.1)
Sodium: 139 mmol/L (ref 135–145)
Total Bilirubin: 0.2 mg/dL — ABNORMAL LOW (ref 0.3–1.2)
Total Protein: 6.5 g/dL (ref 6.5–8.1)

## 2023-01-29 NOTE — Progress Notes (Signed)
Nutrition Assessment   Reason for Assessment: Pt request   ASSESSMENT: 62 year old female completed treatment for left breast cancer. She is currently receiving adjuvant Anastrozole + Verzenio (3/15). Patient is under the care of Dr. Al Pimple.  Met with patient and friend in office. Patient is glad to be finished with radiation. Patient here today to discuss "her nutrition." Of note, patient has attended Nutrition 101, Nutrition and Cancer webinar, and currently attending [facility] series. Patient states she needs a plan to manage all of her chronic illnesses. Says she is tired of taking all of the supplements and has been drinking Ensure to get in daily vitamins/minerals. Patient endorses daily MVI as well. Friend reports she has recently purchased Ensure Max after receiving samples on 4/4. They like the cheesecake flavor. Patient states previously receiving Ensure at no cost through insurance. She now has to pay for this.   Medications: reviewed   Labs: reviewed    Anthropometrics:   Height: 5'3" Weight: 262 lb 12.8 oz  UBW: 255-263 lb (last 4 months) BMI: 46.55  NUTRITION DIAGNOSIS: Food and nutrition related knowledge deficit related to chronic illness as evidenced by pt request for education    INTERVENTION:  Reviewed plant-based diet as well as AICR recommendations for cancer prevention/recurrence - Pt has attended nutrition education classes offered at [facility] Patient reports PCP has referred her to outpatient nutrition services for weight loss Discussed rationale for oral nutrition supplements. There is no clinical indication for recommended daily use. Patient is understanding of this Additional copies of handouts + contact list for local food banks    MONITORING, EVALUATION, GOAL: Pt will adhere to AICR recommendations to reduce risk of cancer recurrence   Next Visit: No follow up scheduled

## 2023-01-29 NOTE — Progress Notes (Signed)
[facility]       Telephone: [phone]?Fax: [phone]   Oncology Clinical Pharmacist Practitioner Initial Assessment  Kelli Estrada is a 62 y.o. female with a diagnosis of breast cancer. They were contacted today via in-person visit.  Indication/Regimen Abemaciclib (Verzenio) is being used appropriately for treatment of breast cancer in the adjuvant setting by Dr. Burnice Logan Iruku.      Wt Readings from Last 1 Encounters:  01/29/23 262 lb 12.8 oz (119.2 kg)    Estimated body surface area is 2.3 meters squared as calculated from the following:   Height as of this encounter:  (1.6 m).   Weight as of this encounter: 262 lb 12.8 oz (119.2 kg).  The dosing regimen is 100 mg by mouth every 12 hours on days 1 to 28 of a 28-day cycle. This is being given  in combination with anastrozole . It is planned to continue until two years in the adjuvant setting per the [facility] trial data.  Clinical pharmacy met with Kelli Estrada today to establish care for her abemaciclib management after being referred by Dr. Al Pimple.  Kelli Estrada started abemaciclib on 12/28/22 and so far she states she is tolerating it well.  We did discuss with her today that the manufacturer of abemaciclib recommends labs every 2 weeks for 2 months, followed by monthly labs for 2 months, and then as clinically indicated.  She next sees Dr. Al Pimple with labs on 03/05/23 and clinical pharmacy will add a visit in approximately 2 weeks on 02/12/23.  Today we went over potential side effects of abemaciclib which include but are not limited to diarrhea, neutropenia, interstitial lung disease, liver toxicity, blood clots, alopecia, nausea, vomiting, and taste disturbances.     Dose Modifications Dr. Al Pimple is starting Ms. Cundy at a reduced dose of abemaciclib of 100 mg every 12 hours.  If she tolerates this well, she may increase this to 150 mg every 12 hours in combination with anastrozole.  Access Assessment Kelli Estrada  will be receiving abemaciclib through [facility] Concerns: None Start date if known: 12/28/22  Allergies No Known Allergies  Vitals    01/29/2023   10:09 AM 01/17/2023    8:50 AM 12/05/2022    1:03 PM  Oncology Vitals  Height 160 cm 160 cm 160 cm  Weight 119.205 kg 116.688 kg 117.073 kg  Weight (lbs) 262 lbs 13 oz 257 lbs 4 oz 258 lbs 2 oz  BMI 46.55 kg/m2   46.55 kg/m2 45.57 kg/m2   45.57 kg/m2 45.72 kg/m2   45.72 kg/m2  Temp 97.9 F (36.6 C) 96.8 F (36 C) 98.2 F (36.8 C)  Pulse Rate 67 74 75  BP 128/71 120/84 106/72  Resp SpO2 100 % 99 % 95 %  BSA (m2) 2.3 m2   2.3 m2 2.28 m2   2.28 m2 2.28 m2   2.28 m2    Laboratory Data    Latest Ref Rng & Units 01/29/2023    8:29 AM 08/22/2022   12:00 PM 05/28/2022    4:32 AM  CBC EXTENDED  WBC 4.0 - 10.5 K/uL 2.8  4.7  8.7   RBC 3.87 - 5.11 MIL/uL 4.13  4.58  4.78   Hemoglobin 12.0 - 15.0 g/dL 29.5  62.1  30.8   HCT 36.0 - 46.0 % 37.7  40.1  41.8   Platelets 150 - 400 K/uL 124  157  164   NEUT# 1.7 -  7.7 K/uL 1.5  2.8  6.9   Lymph# 0.7 - 4.0 K/uL 0.9  1.5  1.2        Latest Ref Rng & Units 01/29/2023    8:29 AM 08/22/2022   12:00 PM 05/28/2022    4:32 AM  CMP  Glucose 70 - 99 mg/dL 93  536  644   BUN 6 - 20 mg/dL Creatinine 0.44 - 1.00 mg/dL 0.34  7.42  5.95   Sodium 135 - 145 mmol/L 139  141  138   Potassium 3.5 - 5.1 mmol/L 3.7  3.9  3.5   Chloride 98 - 111 mmol/L 106  109  108   CO2 22 - 32 mmol/L Calcium 8.9 - 10.3 mg/dL 9.0  9.0  9.4   Total Protein 6.5 - 8.1 g/dL 6.5  6.8    Total Bilirubin 0.3 - 1.2 mg/dL 0.2  0.5    Alkaline Phos 38 - 126 U/L 79  58    AST 15 - 41 U/L 12  12    ALT 0 - 44 U/L 13  12     Contraindications Contraindications were reviewed? Yes Contraindications to therapy were identified? No   Safety Precautions The following safety precautions for the use of abemaciclib were reviewed:  Diarrhea: we reviewed that diarrhea is  common with abemaciclib and confirmed that she does have loperamide (Imodium) at home.  We reviewed how to take this medication PRN and gave her information on abemaciclib Neutropenia: we discussed the importance of having a thermometer and what the Centers for Disease Control and Prevention (CDC) considers a fever which is 100.66F (38C) or higher.  Gave patient 24/7 triage line to call if any fevers or symptoms ILD/Pneumonitis: we reviewed potential symptoms including cough, shortness, and fatigue. Hepatotoxicity: reviewed to contact clinic for RUQ pain that will not subside, yellowing of eyes/skin Venous thromboembolism (VTE): reviewed signs of deep vein thrombosis (DVT) such as leg swelling, redness, pain, or tenderness and signs of pulmonary embolism (PE) such as shortness of breath, rapid or irregular heartbeat, cough, chest pain, or lightheadedness Reviewed to take the medication every 12 hours (with food sometimes can be easier on the stomach) and to take it at the same time every day. Discussed proper storage and handling of abemaciclib  Medication Reconciliation Current Outpatient Medications  Medication Sig Dispense Refill   abemaciclib (VERZENIO) 100 MG tablet Take 1 tablet (100 mg total) by mouth 2 (two) times daily. 56 tablet 1   albuterol (VENTOLIN HFA) 108 (90 Base) MCG/ACT inhaler SMARTSIG:1 Puff(s) Via Inhaler 4 Times Daily PRN     alendronate (FOSAMAX) 70 MG tablet Take 70 mg by mouth once a week.     anastrozole (ARIMIDEX) 1 MG tablet Take 1 tablet (1 mg total) by mouth daily. 90 tablet 3   aspirin 500 MG EC tablet Take 500 mg by mouth every 6 (six) hours as needed for pain.     Blood Glucose Monitoring Suppl (ONETOUCH VERIO REFLECT) w/Device KIT See admin instructions.     [employer] CALCIUM 600 & VITAMIN D3 600-800 MG-UNIT TABS Take 1 tablet by mouth daily.     CVS VITAMIN E 180 MG (400 UNIT) CAPS Take 2 capsules by mouth daily.     fenofibrate (TRICOR) 48 MG tablet Take 40 mg by  mouth daily.     Fluticasone-Umeclidin-Vilant (TRELEGY ELLIPTA) 100-62.5-25 MCG/ACT AEPB Inhale 100 mcg into the lungs  1 day or 1 dose.     gabapentin (NEURONTIN) 300 MG capsule Take 300 mg by mouth 3 (three) times daily.     Ginger 500 MG CAPS Take by mouth.     hydrOXYzine (VISTARIL) 25 MG capsule Take 25 mg by mouth 2 (two) times daily as needed for anxiety.   0   loperamide (IMODIUM) 2 MG capsule Take 2 mg by mouth as needed for diarrhea or loose stools.     metFORMIN (GLUCOPHAGE) 500 MG tablet SMARTSIG:1 Tablet(s) By Mouth Every Evening     mirabegron ER (MYRBETRIQ) 25 MG TB24 tablet Take 1 tablet (25 mg total) by mouth daily. 30 tablet 11   omeprazole (PRILOSEC OTC) 20 MG tablet Take 20 mg by mouth daily.     Oxcarbazepine (TRILEPTAL) 300 MG tablet Take 300 mg by mouth 2 (two) times daily.     oxyCODONE-acetaminophen (PERCOCET/ROXICET) 5-325 MG tablet Take 1 tablet by mouth every 6 (six) hours as needed (For pain.).      OZEMPIC, 1 MG/DOSE, 4 MG/3ML SOPN Inject 1 mg into the skin once a week.     rosuvastatin (CRESTOR) 20 MG tablet Take 20 mg by mouth at bedtime.     sertraline (ZOLOFT) 100 MG tablet Take 100 mg by mouth 2 (two) times daily.  2   tamsulosin (FLOMAX) 0.4 MG CAPS capsule Take 1 capsule (0.4 mg total) by mouth daily. 90 capsule 2   topiramate (TOPAMAX) 100 MG tablet Take 100 mg by mouth daily.     No current facility-administered medications for this visit.   Medication reconciliation is based on the patient's most recent medication list in the electronic medical record (EMR) including herbal products and OTC medications.   The patient's medication list was reviewed today with the patient? Yes   Drug-drug interactions (DDIs) DDIs were evaluated? Yes Significant DDIs identified? No   Drug-Food Interactions Drug-food interactions were evaluated? Yes Drug-food interactions identified?  Yes, she should avoid grapefruit products while on abemaciclib  Follow-up Plan   Continue abemaciclib 100 mg by mouth every 12 hours Continue anastrozole 1 mg by mouth daily Use loperamide (Imodium) as needed for loose stool Monitor ANC Ms. Malinowski will contact Dr. Remonia Richter office should she need any antinausea medication such as ondansetron or prochlorperazine She will continue to be followed by her pain management doctor who she reports is also helping her with her other comorbidities.  Appreciate their recommendations She is establishing care with the [facility] dietitian today. We will add labs, pharmacy clinic visit, in 2 weeks We will add labs to Dr. Remonia Richter visit on 03/05/23  Norm Parcel participated in the discussion, expressed understanding, and voiced agreement with the above plan. All questions were answered to her satisfaction. The patient was advised to contact the clinic at [phone] with any questions or concerns prior to her return visit.   I spent 60 minutes assessing the patient.  Tashi Band A. Odetta Pink, PharmD, BCOP, CPP  Anselm Lis, RPH-CPP, 01/29/2023 11:08 AM  **Disclaimer: This note was dictated with voice recognition software. Similar sounding words can inadvertently be transcribed and this note may contain transcription errors which may not have been corrected upon publication of note.**

## 2023-01-30 ENCOUNTER — Telehealth: Payer: Self-pay | Admitting: Pharmacist

## 2023-01-30 NOTE — Telephone Encounter (Signed)
Scheduled appointment per 4/16 los. Patient is aware of the made appointment.

## 2023-02-05 ENCOUNTER — Encounter: Payer: Self-pay | Admitting: Urology

## 2023-02-05 ENCOUNTER — Ambulatory Visit (HOSPITAL_COMMUNITY)
Admission: RE | Admit: 2023-02-05 | Discharge: 2023-02-05 | Disposition: A | Discharge status: Home / Self Care | Payer: 59 | Source: Ambulatory Visit | Attending: Physician Assistant | Admitting: Physician Assistant

## 2023-02-05 ENCOUNTER — Ambulatory Visit (INDEPENDENT_AMBULATORY_CARE_PROVIDER_SITE_OTHER): Payer: 59 | Admitting: Urology

## 2023-02-05 VITALS — BP 130/81 | HR 85

## 2023-02-05 DIAGNOSIS — N200 Calculus of kidney: Secondary | ICD-10-CM | POA: Insufficient documentation

## 2023-02-05 DIAGNOSIS — N3281 Overactive bladder: Secondary | ICD-10-CM | POA: Diagnosis not present

## 2023-02-05 DIAGNOSIS — N201 Calculus of ureter: Secondary | ICD-10-CM

## 2023-02-05 DIAGNOSIS — N202 Calculus of kidney with calculus of ureter: Secondary | ICD-10-CM | POA: Diagnosis not present

## 2023-02-05 LAB — URINALYSIS, ROUTINE W REFLEX MICROSCOPIC
Bilirubin, UA: NEGATIVE
Glucose, UA: NEGATIVE
Ketones, UA: NEGATIVE
Nitrite, UA: POSITIVE — AB
Protein,UA: NEGATIVE
Specific Gravity, UA: 1.02 (ref 1.005–1.030)
Urobilinogen, Ur: 0.2 mg/dL (ref 0.2–1.0)
pH, UA: 5.5 (ref 5.0–7.5)

## 2023-02-05 LAB — MICROSCOPIC EXAMINATION

## 2023-02-05 MED ORDER — MIRABEGRON ER 25 MG PO TB24
25.0000 mg | ORAL_TABLET | Freq: Every day | ORAL | 11 refills | Status: DC
Start: 2023-02-05 — End: 2023-07-08

## 2023-02-05 MED ORDER — TAMSULOSIN HCL 0.4 MG PO CAPS: 0.4000 mg | ORAL_CAPSULE | Freq: Every day | ORAL | 2 refills | Status: DC

## 2023-02-05 NOTE — Progress Notes (Signed)
02/05/2023 1:51 PM   Norm Parcel 1961/10/02 [id number]  Referring provider: Frederich Chick., MD [address] [place],  [place] [address]  Followup OAb and Nephrolithiasis   HPI: Kelli Estrada is a 62yo here for followup for OAB and nephrolithiasis. No stone events since last visit. KUB from today shows 8mm left lower pole calculus which is stable. She has stable OAb symptoms on mirabegron 25mg  daily. Rare urinary urgency   PMH: Past Medical History:  Diagnosis Date   Anxiety    Chronic pain    Depression    Family history of breast cancer 08/23/2022   H/O degenerative disc disease    History of radiation therapy    Left breast- 10/25/22-12/11/22- Dr. Antony Blackbird   History of radiation therapy    Left breast-10/25/22-12/11/22- Dr. Antony Blackbird   Pre-diabetes    PTSD (post-traumatic stress disorder)    Sleep apnea     Surgical History: Past Surgical History:  Procedure Laterality Date   ABDOMINAL HYSTERECTOMY     BREAST LUMPECTOMY WITH SENTINEL LYMPH NODE BIOPSY Left 09/14/2022   Procedure: LEFT BREAST CENTRAL LUMPECTOMY WITH SENTINEL LYMPH NODE BX;  Surgeon: Griselda Miner, MD;  Location: [facility];  Service: General;  Laterality: Left;   COLONOSCOPY WITH PROPOFOL N/A 03/15/2016   Procedure: COLONOSCOPY WITH PROPOFOL;  Surgeon: Dorena Cookey, MD;  Location: [facility];  Service: Endoscopy;  Laterality: N/A;   NECK SURGERY      Home Medications:  Allergies as of 02/05/2023   No Known Allergies      Medication List        Accurate as of February 05, 2023  1:51 PM. If you have any questions, ask your nurse or doctor.          albuterol 108 (90 Base) MCG/ACT inhaler Commonly known as: VENTOLIN HFA SMARTSIG:1 Puff(s) Via Inhaler 4 Times Daily PRN   alendronate 70 MG tablet Commonly known as: FOSAMAX Take 70 mg by mouth once a week.   anastrozole 1 MG tablet Commonly known as: ARIMIDEX Take 1 tablet (1 mg total) by mouth daily.   aspirin  500 MG EC tablet Take 500 mg by mouth every 6 (six) hours as needed for pain.   [employer] Calcium 600 & Vitamin D3 600-20 MG-MCG Tabs Generic drug: Calcium Carb-Cholecalciferol Take 1 tablet by mouth daily.   [employer] Vitamin E 180 MG (400 UNIT) Caps Generic drug: Vitamin E Take 2 capsules by mouth daily.   fenofibrate 48 MG tablet Commonly known as: TRICOR Take 40 mg by mouth daily.   gabapentin 300 MG capsule Commonly known as: NEURONTIN Take 300 mg by mouth 3 (three) times daily.   Ginger 500 MG Caps Take by mouth.   hydrOXYzine 25 MG capsule Commonly known as: VISTARIL Take 25 mg by mouth 2 (two) times daily as needed for anxiety.   loperamide 2 MG capsule Commonly known as: IMODIUM Take 2 mg by mouth as needed for diarrhea or loose stools.   metFORMIN 500 MG tablet Commonly known as: GLUCOPHAGE SMARTSIG:1 Tablet(s) By Mouth Every Evening   mirabegron ER 25 MG Tb24 tablet Commonly known as: MYRBETRIQ Take 1 tablet (25 mg total) by mouth daily.   omeprazole 20 MG tablet Commonly known as: PRILOSEC OTC Take 20 mg by mouth daily.   OneTouch Verio Reflect w/Device Kit See admin instructions.   Oxcarbazepine 300 MG tablet Commonly known as: TRILEPTAL Take 300 mg by mouth 2 (two) times daily.  oxyCODONE-acetaminophen 5-325 MG tablet Commonly known as: PERCOCET/ROXICET Take 1 tablet by mouth every 6 (six) hours as needed (For pain.).   Ozempic (1 MG/DOSE) 4 MG/3ML Sopn Generic drug: Semaglutide (1 MG/DOSE) Inject 1 mg into the skin once a week.   rosuvastatin 20 MG tablet Commonly known as: CRESTOR Take 20 mg by mouth at bedtime.   sertraline 100 MG tablet Commonly known as: ZOLOFT Take 100 mg by mouth 2 (two) times daily.   tamsulosin 0.4 MG Caps capsule Commonly known as: FLOMAX Take 1 capsule (0.4 mg total) by mouth daily.   topiramate 100 MG tablet Commonly known as: TOPAMAX Take 100 mg by mouth daily.   Trelegy Ellipta 100-62.5-25 MCG/ACT Aepb Generic  drug: Fluticasone-Umeclidin-Vilant Inhale 100 mcg into the lungs 1 day or 1 dose.   Verzenio 100 MG tablet Generic drug: abemaciclib Take 1 tablet (100 mg total) by mouth 2 (two) times daily.        Allergies: No Known Allergies  Family History: Family History  Problem Relation Age of Onset   Lung cancer Mother        dx > 49   Breast cancer Maternal Grandmother 56    Social History:  reports that she has never smoked. She does not have any smokeless tobacco history on file. She reports that she does not currently use alcohol. She reports that she does not use drugs.  ROS: All other review of systems were reviewed and are negative except what is noted above in HPI  Physical Exam: BP 130/81   Pulse 85   Constitutional:  Alert and oriented, No acute distress. HEENT: [place] AT, moist mucus membranes.  Trachea midline, no masses. Cardiovascular: No clubbing, cyanosis, or edema. Respiratory: Normal respiratory effort, no increased work of breathing. GI: Abdomen is soft, nontender, nondistended, no abdominal masses GU: No CVA tenderness.  Lymph: No cervical or inguinal lymphadenopathy. Skin: No rashes, bruises or suspicious lesions. Neurologic: Grossly intact, no focal deficits, moving all 4 extremities. Psychiatric: Normal mood and affect.  Laboratory Data: Lab Results  Component Value Date   WBC 2.8 (L) 01/29/2023   HGB 12.3 01/29/2023   HCT 37.7 01/29/2023   MCV 91.3 01/29/2023   PLT 124 (L) 01/29/2023    Lab Results  Component Value Date   CREATININE 0.79 01/29/2023    No results found for: "PSA"  No results found for: "TESTOSTERONE"  No results found for: "HGBA1C"  Urinalysis    Component Value Date/Time   COLORURINE YELLOW 05/28/2022 0419   APPEARANCEUR Cloudy (A) 09/19/2022 1402   LABSPEC 1.023 05/28/2022 0419   PHURINE 5.0 05/28/2022 0419   GLUCOSEU Negative 09/19/2022 1402   HGBUR MODERATE (A) 05/28/2022 0419   BILIRUBINUR Negative 09/19/2022 1402    KETONESUR NEGATIVE 05/28/2022 0419   PROTEINUR Negative 09/19/2022 1402   PROTEINUR 30 (A) 05/28/2022 0419   NITRITE Positive (A) 09/19/2022 1402   NITRITE NEGATIVE 05/28/2022 0419   LEUKOCYTESUR 2+ (A) 09/19/2022 1402   LEUKOCYTESUR TRACE (A) 05/28/2022 0419    Lab Results  Component Value Date   LABMICR See below: 09/19/2022   WBCUA 11-30 (A) 09/19/2022   LABEPIT 0-10 09/19/2022   BACTERIA Many (A) 09/19/2022    Pertinent Imaging: KUB today: Images reviewed and disucssed with the patient  Results for orders placed during the hospital encounter of 05/30/22  DG Abd 1 View  Narrative CLINICAL DATA:  Left flank pain.  EXAM: ABDOMEN - 1 VIEW  COMPARISON:  None Available.  FINDINGS: The  bowel gas pattern is normal. A moderate to large stool burden is seen involving the ascending and distal transverse colon. A 5 mm soft tissue calcification is seen overlying the medial aspect of the mid left abdomen.  IMPRESSION: 1. Suspected 5 mm left ureteral calculus.   Electronically Signed By: Aram Candela M.D. On: 05/31/2022 23:55  No results found for this or any previous visit.  No results found for this or any previous visit.  No results found for this or any previous visit.  Results for orders placed during the hospital encounter of 07/05/22  Ultrasound renal complete  Narrative CLINICAL DATA:  Follow-up left renal calculi.  EXAM: RENAL / URINARY TRACT ULTRASOUND COMPLETE  COMPARISON:  CT scan of the abdomen and pelvis May 28, 2022.  FINDINGS: Right Kidney:  Renal measurements: 13.7 x 4.9 x 5.4 cm = volume: 186 mL. Echogenicity within normal limits. No mass or hydronephrosis visualized.  Left Kidney:  Renal measurements: 30.3 x 4.9 x 6.2 cm = volume: 212 mL. Contains a 1.4 cm cyst. No follow-up imaging recommended for the cyst. Contains an 8 mm nonobstructive stone.  Bladder:  Appears normal for degree of bladder  distention.  Other:  None.  IMPRESSION: 1. There is an 8 mm nonobstructive stone in the left kidney. 2. The kidneys are otherwise unremarkable. 3. The bladder is normal.   Electronically Signed By: Gerome Sam III M.D. On: 07/05/2022 16:50  No valid procedures specified. No results found for this or any previous visit.  Results for orders placed during the hospital encounter of 05/28/22  CT RENAL STONE STUDY  Narrative CLINICAL DATA:  Left lower quadrant pain.  EXAM: CT ABDOMEN AND PELVIS WITHOUT CONTRAST  TECHNIQUE: Multidetector CT imaging of the abdomen and pelvis was performed following the standard protocol without IV contrast.  RADIATION DOSE REDUCTION: This exam was performed according to the departmental dose-optimization program which includes automated exposure control, adjustment of the mA and/or kV according to patient size and/or use of iterative reconstruction technique.  COMPARISON:  April 29, 2018  FINDINGS: Lower chest: No acute abnormality.  Hepatobiliary: No focal liver abnormality is seen. No gallstones, gallbladder wall thickening, or biliary dilatation.  Pancreas: Unremarkable. No pancreatic ductal dilatation or surrounding inflammatory changes.  Spleen: Normal in size without focal abnormality.  Adrenals/Urinary Tract: Adrenal glands are unremarkable. Kidneys are normal in size, without focal lesions. A 3 mm nonobstructing renal calculus is seen within the posterior aspect of the mid to lower left kidney. An additional 3 mm obstructing renal calculus is seen at the left UVJ. Mild to moderate severity left-sided hydronephrosis and hydroureter are seen. The urinary bladder is poorly distended and subsequently limited in evaluation.  Stomach/Bowel: Stomach is within normal limits. The appendix is not clearly identified. No evidence of bowel wall thickening, distention, or inflammatory changes. Noninflamed diverticula are seen  throughout the sigmoid colon.  Vascular/Lymphatic: No significant vascular findings are present. No enlarged abdominal or pelvic lymph nodes.  Reproductive: Status post hysterectomy. No adnexal masses.  Other: No abdominal wall hernia or abnormality. No abdominopelvic ascites.  Musculoskeletal: No acute or significant osseous findings.  IMPRESSION: 1. 3 mm obstructing renal calculus at the left UVJ. 2. 3 mm nonobstructing left renal calculus. 3. Sigmoid diverticulosis.   Electronically Signed By: Aram Candela M.D. On: 05/28/2022 05:03   Assessment & Plan:    1. Kidney stones -followup 6 months with a KUB - Urinalysis, Routine w reflex microscopic  2. OAB (overactive bladder) -mirabegron  25mg  daily   No follow-ups on file.  Wilkie Aye, MD  [facility] [place]

## 2023-02-05 NOTE — Patient Instructions (Signed)
Overactive Bladder, Adult  Overactive bladder is a condition in which a person has a sudden and frequent need to urinate. A person might also leak urine if he or she cannot get to the bathroom fast enough (urinary incontinence). Sometimes, symptoms can interfere with work or social activities. What are the causes? Overactive bladder is associated with poor nerve signals between your bladder and your brain. Your bladder may get the signal to empty before it is full. You may also have very sensitive muscles that make your bladder squeeze too soon. This condition may also be caused by other factors, such as: Medical conditions: Urinary tract infection. Infection of nearby tissues. Prostate enlargement. Bladder stones, inflammation, or tumors. Diabetes. Muscle or nerve weakness, especially from these conditions: A spinal cord injury. Stroke. Multiple sclerosis. Parkinson's disease. Other causes: Surgery on the uterus or urethra. Drinking too much caffeine or alcohol. Certain medicines, especially those that eliminate extra fluid in the body (diuretics). Constipation. What increases the risk? You may be at greater risk for overactive bladder if you: Are an older adult. Smoke. Are going through menopause. Have prostate problems. Have a neurological disease, such as stroke, dementia, Parkinson's disease, or multiple sclerosis (MS). Eat or drink alcohol, spicy food, caffeine, and other things that irritate the bladder. Are overweight or obese. What are the signs or symptoms? Symptoms of this condition include a sudden, strong urge to urinate. Other symptoms include: Leaking urine. Urinating 8 or more times a day. Waking up to urinate 2 or more times overnight. How is this diagnosed? This condition may be diagnosed based on: Your symptoms and medical history. A physical exam. Blood or urine tests to check for possible causes, such as infection. You may also need to see a health care  provider who specializes in urinary tract problems. This is called a urologist. How is this treated? Treatment for overactive bladder depends on the cause of your condition and whether it is mild or severe. Treatment may include: Bladder training, such as: Learning to control the urge to urinate by following a schedule to urinate at regular intervals. Doing Kegel exercises to strengthen the pelvic floor muscles that support your bladder. Special devices, such as: Biofeedback. This uses sensors to help you become aware of your body's signals. Electrical stimulation. This uses electrodes placed inside the body (implanted) or outside the body. These electrodes send gentle pulses of electricity to strengthen the nerves or muscles that control the bladder. Women may use a plastic device, called a pessary, that fits into the vagina and supports the bladder. Medicines, such as: Antibiotics to treat bladder infection. Antispasmodics to stop the bladder from releasing urine at the wrong time. Tricyclic antidepressants to relax bladder muscles. Injections of botulinum toxin type A directly into the bladder tissue to relax bladder muscles. Surgery, such as: A device may be implanted to help manage the nerve signals that control urination. An electrode may be implanted to stimulate electrical signals in the bladder. A procedure may be done to change the shape of the bladder. This is done only in very severe cases. Follow these instructions at home: Eating and drinking  Make diet or lifestyle changes recommended by your health care provider. These may include: Drinking fluids throughout the day and not only with meals. Cutting down on caffeine or alcohol. Eating a healthy and balanced diet to prevent constipation. This may include: Choosing foods that are high in fiber, such as beans, whole grains, and fresh fruits and vegetables. Limiting foods that   are high in fat and processed sugars, such as fried  and sweet foods. Lifestyle  Lose weight if needed. Do not use any products that contain nicotine or tobacco. These include cigarettes, chewing tobacco, and vaping devices, such as e-cigarettes. If you need help quitting, ask your health care provider. General instructions Take over-the-counter and prescription medicines only as told by your health care provider. If you were prescribed an antibiotic medicine, take it as told by your health care provider. Do not stop taking the antibiotic even if you start to feel better. Use any implants or pessary as told by your health care provider. If needed, wear pads to absorb urine leakage. Keep a log to track how much and when you drink, and when you need to urinate. This will help your health care provider monitor your condition. Keep all follow-up visits. This is important. Contact a health care provider if: You have a fever or chills. Your symptoms do not get better with treatment. Your pain and discomfort get worse. You have more frequent urges to urinate. Get help right away if: You are not able to control your bladder. Summary Overactive bladder refers to a condition in which a person has a sudden and frequent need to urinate. Several conditions may lead to an overactive bladder. Treatment for overactive bladder depends on the cause and severity of your condition. Making lifestyle changes, doing Kegel exercises, keeping a log, and taking medicines can help with this condition. This information is not intended to replace advice given to you by your health care provider. Make sure you discuss any questions you have with your health care provider. Document Revised: 06/20/2020 Document Reviewed: 06/20/2020 Elsevier Patient Education  2023 [employer] Dietary Guidelines to Help Prevent Kidney Stones Kidney stones are deposits of minerals and salts that form inside your kidneys. Your risk of developing kidney stones may be greater depending on  your diet, your lifestyle, the medicines you take, and whether you have certain medical conditions. Most people can lower their risks of developing kidney stones by following these dietary guidelines. Your dietitian may give you more specific instructions depending on your overall health and the type of kidney stones you tend to develop. What are tips for following this plan? Reading food labels  Choose foods with "no salt added" or "low-salt" labels. Limit your salt (sodium) intake to less than 1,500 mg a day. Choose foods with calcium for each meal and snack. Try to eat about 300 mg of calcium at each meal. Foods that contain 200-500 mg of calcium a serving include: 8 oz (237 mL) of milk, calcium-fortifiednon-dairy milk, and calcium-fortifiedfruit juice. Calcium-fortified means that calcium has been added to these drinks. 8 oz (237 mL) of kefir, yogurt, and soy yogurt. 4 oz (114 g) of tofu. 1 oz (28 g) of cheese. 1 cup (150 g) of dried figs. 1 cup (91 g) of cooked broccoli. One 3 oz (85 g) can of sardines or mackerel. Most people need 1,000-1,500 mg of calcium a day. Talk to your dietitian about how much calcium is recommended for you. Shopping Buy plenty of fresh fruits and vegetables. Most people do not need to avoid fruits and vegetables, even if these foods contain nutrients that may contribute to kidney stones. When shopping for convenience foods, choose: Whole pieces of fruit. Pre-made salads with dressing on the side. Low-fat fruit and yogurt smoothies. Avoid buying frozen meals or prepared deli foods. These can be high in sodium. Look for foods with live  cultures, such as yogurt and kefir. Choose high-fiber grains, such as whole-wheat breads, oat bran, and wheat cereals. Cooking Do not add salt to food when cooking. Place a salt shaker on the table and allow each person to add their own salt to taste. Use vegetable protein, such as beans, textured vegetable protein (TVP), or tofu,  instead of meat in pasta, casseroles, and soups. Meal planning Eat less salt, if told by your dietitian. To do this: Avoid eating processed or pre-made food. Avoid eating fast food. Eat less animal protein, including cheese, meat, poultry, or fish, if told by your dietitian. To do this: Limit the number of times you have meat, poultry, fish, or cheese each week. Eat a diet free of meat at least 2 days a week. Eat only one serving each day of meat, poultry, fish, or seafood. When you prepare animal proteins, cut pieces into small portion sizes. For most meat and fish, one serving is about the size of the palm of your hand. Eat at least five servings of fresh fruits and vegetables each day. To do this: Keep fruits and vegetables on hand for snacks. Eat one piece of fruit or a handful of berries with breakfast. Have a salad and fruit at lunch. Have two kinds of vegetables at dinner. You may be told to limit foods that are high in a substance called oxalate. These include: Spinach (cooked), rhubarb, beets, sweet potatoes, and Swiss chard. Peanuts. Potato chips, french fries, and baked potatoes with skin on. Nuts and nut products. Chocolate. If you regularly take a diuretic medicine, make sure to eat at least 1 or 2 servings of fruits or vegetables that are high in potassium each day. These include: Avocado. Banana. Orange, prune, carrot, or tomato juice. Baked potato. Cabbage. Beans and split peas. Lifestyle  Drink enough fluid to keep your urine pale yellow. This is the most important thing you can do. Spread your fluid intake throughout the day. If you drink alcohol: Limit how much you have to: 0-1 drink a day for women who are not pregnant. 0-2 drinks a day for men. Know how much alcohol is in your drink. In the [place] one drink equals one 12 oz bottle of beer (355 mL), one 5 oz glass of wine (148 mL), or one 1 oz glass of hard liquor (44 mL). Lose weight if told by your health care  provider. Work with your dietitian to find an eating plan and weight loss strategies that work best for you. General information Talk to your health care provider and dietitian about taking daily supplements. Depending on your health and the cause of your kidney stones, you may be told: Do not take high-dose supplements of vitamin C (1,000 mg a day or more). To take a calcium supplement. To take a daily probiotic supplement. To take other supplements such as magnesium, fish oil, or vitamin B6. Take over-the-counter and prescription medicines only as told by your health care provider. These include supplements. What foods should I limit? Limit your intake of the following foods, or eat them as told by your dietitian. Vegetables Spinach. Rhubarb. Beets. Canned vegetables. Rosita Fire. Olives. Baked potatoes with skin. Grains Wheat bran. Baked goods. Salted crackers. Cereals high in sugar. Meats and other proteins Nuts. Nut butters. Large portions of meat, poultry, or fish. Salted, precooked, or cured meats, such as sausages, meat loaves, and hot dogs. Dairy Cheeses. Beverages Regular soft drinks. Regular vegetable juice. Seasonings and condiments Seasoning blends with salt. Salad dressings.  Soy sauce. Ketchup. Barbecue sauce. Other foods Canned soups. Canned pasta sauce. Casseroles. Pizza. Lasagna. Frozen meals. Potato chips. [place] fries. The items listed above may not be a complete list of foods and beverages you should limit. Contact a dietitian for more information. What foods should I avoid? Talk to your dietitian about specific foods you should avoid based on the type of kidney stones you have and your overall health. Fruits Grapefruit. The item listed above may not be a complete list of foods and beverages you should avoid. Contact a dietitian for more information. Summary Kidney stones are deposits of minerals and salts that form inside your kidneys. You can lower your risk of  kidney stones by making changes to your diet. The most important thing you can do is drink enough fluid. Drink enough fluid to keep your urine pale yellow. Talk to your dietitian about how much calcium you should have each day, and eat less salt and animal protein as told by your dietitian. This information is not intended to replace advice given to you by your health care provider. Make sure you discuss any questions you have with your health care provider. Document Revised: 01/11/2022 Document Reviewed: 01/11/2022 Elsevier Patient Education  2023 [employer].

## 2023-02-12 ENCOUNTER — Other Ambulatory Visit: Payer: Self-pay

## 2023-02-12 ENCOUNTER — Inpatient Hospital Stay: Payer: 59

## 2023-02-12 ENCOUNTER — Inpatient Hospital Stay: Payer: 59 | Admitting: Pharmacist

## 2023-02-12 VITALS — BP 103/86 | HR 77 | Temp 97.8°F | Resp 18 | Ht 63.0 in | Wt 252.5 lb

## 2023-02-12 DIAGNOSIS — C50112 Malignant neoplasm of central portion of left female breast: Secondary | ICD-10-CM | POA: Diagnosis not present

## 2023-02-12 LAB — CBC WITH DIFFERENTIAL (CANCER CENTER ONLY)
Abs Immature Granulocytes: 0.01 10*3/uL (ref 0.00–0.07)
Basophils Absolute: 0.1 10*3/uL (ref 0.0–0.1)
Basophils Relative: 1 %
Eosinophils Absolute: 0.1 10*3/uL (ref 0.0–0.5)
Eosinophils Relative: 1 %
HCT: 40.2 % (ref 36.0–46.0)
Hemoglobin: 13 g/dL (ref 12.0–15.0)
Immature Granulocytes: 0 %
Lymphocytes Relative: 17 %
Lymphs Abs: 0.7 10*3/uL (ref 0.7–4.0)
MCH: 29.4 pg (ref 26.0–34.0)
MCHC: 32.3 g/dL (ref 30.0–36.0)
MCV: 91 fL (ref 80.0–100.0)
Monocytes Absolute: 0.4 10*3/uL (ref 0.1–1.0)
Monocytes Relative: 9 %
Neutro Abs: 3.1 10*3/uL (ref 1.7–7.7)
Neutrophils Relative %: 72 %
Platelet Count: 157 10*3/uL (ref 150–400)
RBC: 4.42 MIL/uL (ref 3.87–5.11)
RDW: 14.2 % (ref 11.5–15.5)
WBC Count: 4.3 10*3/uL (ref 4.0–10.5)
nRBC: 0 % (ref 0.0–0.2)

## 2023-02-12 LAB — CMP (CANCER CENTER ONLY)
ALT: 11 U/L (ref 0–44)
AST: 13 U/L — ABNORMAL LOW (ref 15–41)
Albumin: 4.1 g/dL (ref 3.5–5.0)
Alkaline Phosphatase: 56 U/L (ref 38–126)
Anion gap: 8 (ref 5–15)
BUN: 12 mg/dL (ref 6–20)
CO2: 29 mmol/L (ref 22–32)
Calcium: 10.7 mg/dL — ABNORMAL HIGH (ref 8.9–10.3)
Chloride: 102 mmol/L (ref 98–111)
Creatinine: 1.13 mg/dL — ABNORMAL HIGH (ref 0.44–1.00)
GFR, Estimated: 56 mL/min — ABNORMAL LOW (ref >60)
Glucose, Bld: 104 mg/dL — ABNORMAL HIGH (ref 70–99)
Potassium: 4 mmol/L (ref 3.5–5.1)
Sodium: 139 mmol/L (ref 135–145)
Total Bilirubin: 0.5 mg/dL (ref 0.3–1.2)
Total Protein: 7.3 g/dL (ref 6.5–8.1)

## 2023-02-12 MED ORDER — ONDANSETRON HCL 8 MG PO TABS: 8.0000 mg | ORAL_TABLET | Freq: Three times a day (TID) | ORAL | 2 refills | Status: AC | PRN

## 2023-02-12 MED ORDER — PROCHLORPERAZINE MALEATE 10 MG PO TABS: 10.0000 mg | ORAL_TABLET | Freq: Four times a day (QID) | ORAL | 2 refills | Status: AC | PRN

## 2023-02-12 NOTE — Progress Notes (Signed)
[facility]       Telephone: [phone]?Fax: [phone]   Oncology Clinical Pharmacist Practitioner Progress Note  Kelli Estrada was contacted via in-person to discuss her chemotherapy regimen for abemaciclib which they receive under the care of Dr. Rachel Moulds.  Current treatment regimen and start date Abemaciclib (12/28/22) Anastrozole (12/31/22)  Interval History She continues on abemaciclib 100 mg by mouth every 12 hours on days 1 to 28 of a 28-day cycle. This is being given in combination with anastrozole . Therapy is planned to continue until two years in the adjuvant setting per the monarchE trial data. She was seen today as a follow up to her abemaciclib management. Kelli Estrada was last seen by clinical pharmacy on 01/29/23 and Dr. Al Pimple on 12/05/22. She believes she have been bitten by a tick on Sunday and is going to be seen by her PCP today to be evaluated. She will see Dr. Al Pimple again on 03/05/23 and clinical pharmacy on 04/02/23.   Response to Therapy Ms. Morford reports having one episode of vomiting this morning. She preferred [place] not send any anti-nausea medications at her last visit. Today, per her request, we sent prochlorperazine and ondansetron to her local pharmacy of choice. We reviewed how to take each PRN anti-nausea medication. Her serum creatinine and calcium were slightly elevated today and she admits to not drinking as much fluid as before. We discussed stopping the calcium/vitd supplement and she states she has stopped taking this for now. We will remove from her med list. We also discussed the importance of staying hydrated especially in the setting of vomiting and with the temperatures getting warmer outside. She verbalized understanding of the plan.   Her ANC has recovered after being 1500 cells/uL at her last visit. She was the dietitian and said she did not think she needed to follow up with their team. She does report missing two doses of abemaciclib.  Labs, vitals, treatment parameters, and manufacturer guidelines assessing toxicity were reviewed with Norm Parcel today. Based on these values, patient is in agreement to continue abemaciclib therapy at this time.  Allergies No Known Allergies  Vitals    02/12/2023    1:18 PM 02/05/2023    1:46 PM 01/29/2023   10:09 AM  Oncology Vitals  Height 160 cm  160 cm  Weight 114.533 kg  119.205 kg  Weight (lbs) 252 lbs 8 oz  262 lbs 13 oz  BMI 44.73 kg/m2   44.73 kg/m2  46.55 kg/m2   46.55 kg/m2  Temp 97.8 F (36.6 C)  97.9 F (36.6 C)  Pulse Rate 77 85 67  BP 103/86 130/81 128/71  Resp 18  18  SpO2 100 %  100 %  BSA (m2) 2.26 m2   2.26 m2  2.3 m2   2.3 m2    Laboratory Data    Latest Ref Rng & Units 02/12/2023   12:13 PM 01/29/2023    8:29 AM 08/22/2022   12:00 PM  CBC EXTENDED  WBC 4.0 - 10.5 K/uL 4.3  2.8  4.7   RBC 3.87 - 5.11 MIL/uL 4.42  4.13  4.58   Hemoglobin 12.0 - 15.0 g/dL 62.9  52.8  41.3   HCT 36.0 - 46.0 % 40.2  37.7  40.1   Platelets 150 - 400 K/uL 157  124  157   NEUT# 1.7 - 7.7 K/uL 3.1  1.5  2.8   Lymph# 0.7 - 4.0 K/uL 0.7  0.9  1.5  Latest Ref Rng & Units 02/12/2023   12:13 PM 01/29/2023    8:29 AM 08/22/2022   12:00 PM  CMP  Glucose 70 - 99 mg/dL 161  93  096   BUN 6 - 20 mg/dL 12  11  12    Creatinine 0.44 - 1.00 mg/dL 0.45  4.09  8.11   Sodium 135 - 145 mmol/L 139  139  141   Potassium 3.5 - 5.1 mmol/L 4.0  3.7  3.9   Chloride 98 - 111 mmol/L 102  106  109   CO2 22 - 32 mmol/L 29  28  25    Calcium 8.9 - 10.3 mg/dL 91.4  9.0  9.0   Total Protein 6.5 - 8.1 g/dL 7.3  6.5  6.8   Total Bilirubin 0.3 - 1.2 mg/dL 0.5  0.2  0.5   Alkaline Phos 38 - 126 U/L 56  79  58   AST 15 - 41 U/L 13  12  12    ALT 0 - 44 U/L 11  13  12      No results found for: "MG" No results found for: "CA2729"   Adverse Effects Assessment Vomiting: had one episode this morning. She did not want anti-nausea medications sent last time. We sent them today to her local pharmacy  of choice and reviewed how to take each agent.  Adherence Assessment Jaide Hillenburg reports missing 2 doses over the past 2 weeks.   Reason for missed dose: forgot.  Patient was re-educated on importance of adherence.   Access Assessment Idamae Coccia is currently receiving her abemaciclib through [facility] concerns:  none  Medication Reconciliation The patient's medication list was reviewed today with the patient? Yes New medications or herbal supplements have recently been started? No  Any medications have been discontinued? Yes , calcium/vitamin d The medication list was updated and reconciled based on the patient's most recent medication list in the electronic medical record (EMR) including herbal products and OTC medications.   Medications Current Outpatient Medications  Medication Sig Dispense Refill   ondansetron (ZOFRAN) 8 MG tablet Take 1 tablet (8 mg total) by mouth every 8 (eight) hours as needed for nausea or vomiting. 30 tablet 2   prochlorperazine (COMPAZINE) 10 MG tablet Take 1 tablet (10 mg total) by mouth every 6 (six) hours as needed for nausea or vomiting. 30 tablet 2   abemaciclib (VERZENIO) 100 MG tablet Take 1 tablet (100 mg total) by mouth 2 (two) times daily. 56 tablet 1   albuterol (VENTOLIN HFA) 108 (90 Base) MCG/ACT inhaler SMARTSIG:1 Puff(s) Via Inhaler 4 Times Daily PRN     alendronate (FOSAMAX) 70 MG tablet Take 70 mg by mouth once a week.     anastrozole (ARIMIDEX) 1 MG tablet Take 1 tablet (1 mg total) by mouth daily. 90 tablet 3   aspirin 500 MG EC tablet Take 500 mg by mouth every 6 (six) hours as needed for pain.     Blood Glucose Monitoring Suppl (ONETOUCH VERIO REFLECT) w/Device KIT See admin instructions.     [employer] CALCIUM 600 & VITAMIN D3 600-800 MG-UNIT TABS Take 1 tablet by mouth daily.     [employer] VITAMIN E 180 MG (400 UNIT) CAPS Take 2 capsules by mouth daily.     fenofibrate (TRICOR) 48 MG tablet Take 40 mg by mouth  daily.     Fluticasone-Umeclidin-Vilant (TRELEGY ELLIPTA) 100-62.5-25 MCG/ACT AEPB Inhale 100 mcg into the lungs 1 day or 1 dose.  gabapentin (NEURONTIN) 300 MG capsule Take 300 mg by mouth 3 (three) times daily.     Ginger 500 MG CAPS Take by mouth.     hydrOXYzine (VISTARIL) 25 MG capsule Take 25 mg by mouth 2 (two) times daily as needed for anxiety.   0   loperamide (IMODIUM) 2 MG capsule Take 2 mg by mouth as needed for diarrhea or loose stools.     metFORMIN (GLUCOPHAGE) 500 MG tablet SMARTSIG:1 Tablet(s) By Mouth Every Evening     mirabegron ER (MYRBETRIQ) 25 MG TB24 tablet Take 1 tablet (25 mg total) by mouth daily. 30 tablet 11   omeprazole (PRILOSEC OTC) 20 MG tablet Take 20 mg by mouth daily.     Oxcarbazepine (TRILEPTAL) 300 MG tablet Take 300 mg by mouth 2 (two) times daily.     oxyCODONE-acetaminophen (PERCOCET/ROXICET) 5-325 MG tablet Take 1 tablet by mouth every 6 (six) hours as needed (For pain.).      OZEMPIC, 1 MG/DOSE, 4 MG/3ML SOPN Inject 1 mg into the skin once a week.     rosuvastatin (CRESTOR) 20 MG tablet Take 20 mg by mouth at bedtime.     sertraline (ZOLOFT) 100 MG tablet Take 100 mg by mouth 2 (two) times daily.  2   tamsulosin (FLOMAX) 0.4 MG CAPS capsule Take 1 capsule (0.4 mg total) by mouth daily. 90 capsule 2   topiramate (TOPAMAX) 100 MG tablet Take 100 mg by mouth daily.     No current facility-administered medications for this visit.    Drug-Drug Interactions (DDIs) DDIs were evaluated? Yes Significant DDIs?  Reviewed sertraline dosing in the past with patient The patient was instructed to speak with their health care provider and/or the oral chemotherapy pharmacist before starting any new drug, including prescription or over the counter, natural / herbal products, or vitamins.  Supportive Care Diarrhea: we reviewed that diarrhea is common with abemaciclib and confirmed that she does have loperamide (Imodium) at home.  We reviewed how to take this  medication PRN. Neutropenia: we discussed the importance of having a thermometer and what the Centers for Disease Control and Prevention (CDC) considers a fever which is 100.75F (38C) or higher.  Gave patient 24/7 triage line to call if any fevers or symptoms. ILD/Pneumonitis: we reviewed potential symptoms including cough, shortness, and fatigue.  VTE: reviewed signs of DVT such as leg swelling, redness, pain, or tenderness and signs of PE such as shortness of breath, rapid or irregular heartbeat, cough, chest pain, or lightheadedness. Reviewed to take the medication every 12 hours (with food sometimes can be easier on the stomach) and to take it at the same time every day. Hepatotoxicity: WNL She will see PCP today for possible tick bite that happened Sunday  Dosing Assessment Hepatic adjustments needed? No  Renal adjustments needed? No , closely monitor serum creatinine Toxicity adjustments needed? No  The current dosing regimen is appropriate to continue at this time.  Follow-Up Plan Continue abemaciclib 100 mg by mouth every 12 hours -- could possibly increase dose to 150 mg BID in future if serum creatinine and calcium normalize with other values staying normal. Continue anastrozole 1 mg by mouth daily Use loperamide (Imodium) as needed for loose stool Neutropenia resolved Monitor nausea and vomiting, Scr, calcium -- prochlorperazine and ondansetron sent to her local pharmacy of choice. She will increase fluid intake She will continue to be followed by her pain management doctor who she reports is also helping her with her other comorbidities.  Appreciate their recommendations We will add labs, pharmacy clinic visit, in 8 weeks We will add labs to Dr. Remonia Richter visit on 03/05/23 She is seeing her PCP today for possible tick bite  Maesyn Frisinger participated in the discussion, expressed understanding, and voiced agreement with the above plan. All questions were answered to her satisfaction.  The patient was advised to contact the clinic at [phone] with any questions or concerns prior to her return visit.   I spent 30 minutes assessing and educating the patient.  Annica Marinello A. Odetta Pink, PharmD, BCOP, CPP  Anselm Lis, RPH-CPP, 02/12/2023  1:50 PM   **Disclaimer: This note was dictated with voice recognition software. Similar sounding words can inadvertently be transcribed and this note may contain transcription errors which may not have been corrected upon publication of note.**

## 2023-02-13 ENCOUNTER — Other Ambulatory Visit: Payer: Self-pay | Admitting: Hematology and Oncology

## 2023-02-13 ENCOUNTER — Other Ambulatory Visit (HOSPITAL_COMMUNITY): Payer: Self-pay

## 2023-02-13 ENCOUNTER — Other Ambulatory Visit: Payer: Self-pay

## 2023-02-13 MED ORDER — ABEMACICLIB 100 MG PO TABS
100.0000 mg | ORAL_TABLET | Freq: Two times a day (BID) | ORAL | 1 refills | Status: DC
  Filled 2023-02-13: qty 56, 28d supply, fill #0
  Filled 2023-03-12: qty 56, 28d supply, fill #1

## 2023-02-14 ENCOUNTER — Other Ambulatory Visit (HOSPITAL_COMMUNITY): Payer: Self-pay

## 2023-02-14 ENCOUNTER — Telehealth: Payer: Self-pay | Admitting: Pharmacist

## 2023-02-14 NOTE — Telephone Encounter (Signed)
Scheduled appointment per 4/30 los. Patient is aware of the made appointments.

## 2023-02-20 ENCOUNTER — Ambulatory Visit: Payer: 59 | Attending: Radiology | Admitting: Physical Therapy

## 2023-02-20 ENCOUNTER — Other Ambulatory Visit: Payer: Self-pay

## 2023-02-20 ENCOUNTER — Encounter: Payer: Self-pay | Admitting: Physical Therapy

## 2023-02-20 DIAGNOSIS — Z170 Estrogen receptor positive status [ER+]: Secondary | ICD-10-CM | POA: Insufficient documentation

## 2023-02-20 DIAGNOSIS — C50112 Malignant neoplasm of central portion of left female breast: Secondary | ICD-10-CM | POA: Insufficient documentation

## 2023-02-20 DIAGNOSIS — I890 Lymphedema, not elsewhere classified: Secondary | ICD-10-CM | POA: Diagnosis present

## 2023-02-20 DIAGNOSIS — L599 Disorder of the skin and subcutaneous tissue related to radiation, unspecified: Secondary | ICD-10-CM | POA: Insufficient documentation

## 2023-02-20 DIAGNOSIS — R293 Abnormal posture: Secondary | ICD-10-CM | POA: Diagnosis present

## 2023-02-20 NOTE — Therapy (Signed)
OUTPATIENT PHYSICAL THERAPY BREAST CANCER BASELINE EVALUATION   Patient Name: Kelli Estrada MRN: [id number] DOB:31-Aug-1961, 62 y.o., female Today's Date: 02/20/2023   PT End of Session - 02/20/23 1244     Visit Number 1    Number of Visits 9    Date for PT Re-Evaluation 03/20/23    PT Start Time 1204    PT Stop Time 1240    PT Time Calculation (min) 36 min    Activity Tolerance Patient tolerated treatment well    Behavior During Therapy [facility] for tasks assessed/performed              Past Medical History:  Diagnosis Date   Anxiety    Chronic pain    Depression    Family history of breast cancer 08/23/2022   H/O degenerative disc disease    History of radiation therapy    Left breast- 10/25/22-12/11/22- Dr. Antony Blackbird   History of radiation therapy    Left breast-10/25/22-12/11/22- Dr. Antony Blackbird   Pre-diabetes    PTSD (post-traumatic stress disorder)    Sleep apnea    Past Surgical History:  Procedure Laterality Date   ABDOMINAL HYSTERECTOMY     BREAST LUMPECTOMY WITH SENTINEL LYMPH NODE BIOPSY Left 09/14/2022   Procedure: LEFT BREAST CENTRAL LUMPECTOMY WITH SENTINEL LYMPH NODE BX;  Surgeon: Griselda Miner, MD;  Location: [facility];  Service: General;  Laterality: Left;   COLONOSCOPY WITH PROPOFOL N/A 03/15/2016   Procedure: COLONOSCOPY WITH PROPOFOL;  Surgeon: Dorena Cookey, MD;  Location: [facility];  Service: Endoscopy;  Laterality: N/A;   NECK SURGERY     Patient Active Problem List   Diagnosis Date Noted   Genetic testing 08/27/2022   Family history of breast cancer 08/23/2022   Malignant neoplasm of central portion of left breast in female, estrogen receptor positive (HCC) 08/20/2022    REFERRING PROVIDER: Erven Colla, PA-C  REFERRING DIAG:C50.112,Z17.0 (ICD-10-CM) - Malignant neoplasm of central portion of left breast in female, estrogen receptor positive (HCC)  THERAPY DIAG:  Lymphedema, not elsewhere classified  Disorder of the skin  and subcutaneous tissue related to radiation, unspecified  Abnormal posture  Rationale for Evaluation and Treatment: Rehabilitation  ONSET DATE: 07/18/2022  SUBJECTIVE:                                                                                                                                                                                           SUBJECTIVE STATEMENT: I was supposed to come back and I would miss the phone call and then I would go to sleep. Then I thought I did not need it. I  have had this pain on and off since the surgery and the doctor said I should come here. I have tingling in my arm. I see a pain management doctor. I have been nauseated since I started the cancer medicine. I got a tick bite and am on antibiotics and they make me dizzy.   PERTINENT HISTORY:  Patient was diagnosed on 07/18/2022 with left grade 2 invasive ductal carcinoma breast cancer. It measures 1.9 cm and is located in the central quadrant. It is ER/PR positive and HER2 negative with a Ki67 of 10%. 09/14/22 L breast lumpectomy and SLNB 1/3. Completed radiation. Hx of chronic pain, degenerative disc disease, PTSD  PATIENT GOALS:   to decrease the pain under the arm and on the trunk  PAIN:  Are you having pain? Yes: NPRS scale: 6/10 Pain location: axilla Pain description: sharp, dull, ache Aggravating factors: lying down Relieving factors: lying supine  PRECAUTIONS: at risk for lymphedema Fall, DDD  HAND DOMINANCE: right  WEIGHT BEARING RESTRICTIONS: No  FALLS:  Has patient fallen in last 6 months? No  LIVING ENVIRONMENT: Patient lives with: her daughter Lives in: House/apartment Has following equipment at home: Single point cane  OCCUPATION: On disability  LEISURE: Walks some in her yard  PRIOR LEVEL OF FUNCTION: Independent   OBJECTIVE:  COGNITION: Overall cognitive status: Within functional limits for tasks assessed    POSTURE:  Forward head and rounded shoulders  posture  UPPER EXTREMITY AROM/PROM:  A/PROM RIGHT   08/22/22   Shoulder extension 40  Shoulder flexion 146  Shoulder abduction 149  Shoulder internal rotation 67  Shoulder external rotation 77    (Blank rows = not tested)  A/PROM LEFT   08/22/22 LEFT  02/20/23  Shoulder extension 32 44  Shoulder flexion 139 155  Shoulder abduction 152 148  Shoulder internal rotation 60 52  Shoulder external rotation 75 88    (Blank rows = not tested)     UPPER EXTREMITY STRENGTH: WFL  LYMPHEDEMA ASSESSMENTS:   LANDMARK RIGHT   08/22/22 RIGHT 02/20/23  10 cm proximal to olecranon process 40 40  Olecranon process 27.5 27.3  10 cm proximal to ulnar styloid process 25.2 23.8  Just proximal to ulnar styloid process 15.8 16  Across hand at thumb web space 17.9 18  At base of 2nd digit 5.9 5.9  (Blank rows = not tested)  LANDMARK LEFT   08/22/22 LEFT 02/20/23  10 cm proximal to olecranon process 40.2 37  Olecranon process 26.5 27.4  10 cm proximal to ulnar styloid process 25.1 24.7  Just proximal to ulnar styloid process 16.3 15.9  Across hand at thumb web space 18.3 18.4  At base of 2nd digit 6.5 6.1  (Blank rows = not tested)  L-DEX LYMPHEDEMA SCREENING:  The patient was assessed using the L-Dex machine today to produce a lymphedema index baseline score. The patient will be reassessed on a regular basis (typically every 3 months) to obtain new L-Dex scores. If the score is > 6.5 points away from his/her baseline score indicating onset of subclinical lymphedema, it will be recommended to wear a compression garment for 4 weeks, 12 hours per day and then be reassessed. If the score continues to be > 6.5 points from baseline at reassessment, we will initiate lymphedema treatment. Assessing in this manner has a 95% rate of preventing clinically significant lymphedema.   L-DEX FLOWSHEETS - 02/20/23 1200       L-DEX LYMPHEDEMA SCREENING   Measurement Type Unilateral  L-DEX MEASUREMENT  EXTREMITY Upper Extremity    POSITION  Standing    DOMINANT SIDE Right    At Risk Side Left    BASELINE SCORE (UNILATERAL) 12.5    L-DEX SCORE (UNILATERAL) 12.4    VALUE CHANGE (UNILAT) -0.1             LLIS: 33.82  QUICK DASH:   Quick Dash - 02/20/23 0001     Open a tight or new jar Moderate difficulty    Do heavy household chores (wash walls, wash floors) Moderate difficulty    Carry a shopping bag or briefcase Moderate difficulty    Wash your back Mild difficulty    Use a knife to cut food Mild difficulty    Recreational activities in which you take some force or impact through your arm, shoulder, or hand (golf, hammering, tennis) Mild difficulty    During the past week, to what extent has your arm, shoulder or hand problem interfered with your normal social activities with family, friends, neighbors, or groups? Slightly    During the past week, to what extent has your arm, shoulder or hand problem limited your work or other regular daily activities Slightly    Arm, shoulder, or hand pain. Mild    Tingling (pins and needles) in your arm, shoulder, or hand Moderate    Difficulty Sleeping Moderate difficulty    DASH Score 36.36 %              PATIENT EDUCATION:  Education details: lymphedema, anatomy and physiology of the lymphatic system, stop ice due to risk of worsening lymphedema Person educated: Patient Education method: Explanation Education comprehension: Patient verbalized   HOME EXERCISE PROGRAM: Wear compression bra as much as possible Contact 2nd to Nature to obtain another compression bra   ASSESSMENT:  CLINICAL IMPRESSION: Patient was diagnosed on 07/18/2022 with left grade 2 invasive ductal carcinoma breast cancer. She underwent a L lumpectomy on 09/14/22 with 3 nodes removed - 1 positive. She completed radiation. She reports breast pain and axillary pain and discomfort since that time. She has been wearing compression and tries self massage. There is  considerable swelling in her L breast with peau d orange texture. She would benefit from skilled PT services to decrease L breast lymphedema, decrease pain and progress pt towards independent management.   Pt will benefit from skilled therapeutic intervention to improve on the following deficits: Decreased knowledge of precautions, pain, decreased ROM, postural dysfunction, decreased knowledge of condition, increased edema   PT treatment/interventions: ADL/Self care home management, Therapeutic exercises, Therapeutic activity, Patient/Family education, Self Care, Orthotic/Fit training, Manual lymph drainage, Compression bandaging, scar mobilization, Taping, Vasopneumatic device, Manual therapy, and Re-evaluation   REHAB POTENTIAL: Good  CLINICAL DECISION MAKING: Stable/uncomplicated  EVALUATION COMPLEXITY: Low   GOALS: Goals reviewed with patient? YES  LONG TERM GOALS: (STG=LTG)    Name Target Date Goal status  1 Pt will be independent in self MLD for long term management of L breast lymphedema.    03/20/2023 NEW  2 Pt will obtain appropriate compression bra for long term management of lymphedema. 03/20/23 NEW    3 Pt will report a 75% decrease in pain in her L breast and axilla to allow improved comfort.   03/20/23 NEW  4 Pt will be independent in a home exercise program for continued stretching and strengthening.   03/20/23 NEW    PLAN:  PT FREQUENCY/DURATION: 2x/wk for 4 wks  PLAN FOR NEXT SESSION: MLD to L breast  and instruct, did she contact 2nd to nature, STM to L axilla/MFR   Leonette Most, PT 02/20/23 12:54 PM

## 2023-02-26 ENCOUNTER — Ambulatory Visit: Payer: 59

## 2023-02-26 DIAGNOSIS — I890 Lymphedema, not elsewhere classified: Secondary | ICD-10-CM

## 2023-02-26 DIAGNOSIS — R293 Abnormal posture: Secondary | ICD-10-CM

## 2023-02-26 DIAGNOSIS — L599 Disorder of the skin and subcutaneous tissue related to radiation, unspecified: Secondary | ICD-10-CM

## 2023-02-26 NOTE — Therapy (Signed)
OUTPATIENT PHYSICAL THERAPY BREAST CANCER TREATMENT   Patient Name: Kelli Estrada MRN: [id number] DOB:Mar 29, 1961, 62 y.o., female Today's Date: 02/26/2023   PT End of Session - 02/26/23 0810     Visit Number 2    Number of Visits 9    Date for PT Re-Evaluation 03/20/23    PT Start Time 0807   pt arrived late   PT Stop Time 0900    PT Time Calculation (min) 53 min    Activity Tolerance Patient tolerated treatment well    Behavior During Therapy [facility] for tasks assessed/performed              Past Medical History:  Diagnosis Date   Anxiety    Chronic pain    Depression    Family history of breast cancer 08/23/2022   H/O degenerative disc disease    History of radiation therapy    Left breast- 10/25/22-12/11/22- Dr. Antony Blackbird   History of radiation therapy    Left breast-10/25/22-12/11/22- Dr. Antony Blackbird   Pre-diabetes    PTSD (post-traumatic stress disorder)    Sleep apnea    Past Surgical History:  Procedure Laterality Date   ABDOMINAL HYSTERECTOMY     BREAST LUMPECTOMY WITH SENTINEL LYMPH NODE BIOPSY Left 09/14/2022   Procedure: LEFT BREAST CENTRAL LUMPECTOMY WITH SENTINEL LYMPH NODE BX;  Surgeon: Griselda Miner, MD;  Location: [facility];  Service: General;  Laterality: Left;   COLONOSCOPY WITH PROPOFOL N/A 03/15/2016   Procedure: COLONOSCOPY WITH PROPOFOL;  Surgeon: Dorena Cookey, MD;  Location: [facility];  Service: Endoscopy;  Laterality: N/A;   NECK SURGERY     Patient Active Problem List   Diagnosis Date Noted   Genetic testing 08/27/2022   Family history of breast cancer 08/23/2022   Malignant neoplasm of central portion of left breast in female, estrogen receptor positive (HCC) 08/20/2022    REFERRING PROVIDER: Erven Colla, PA-C  REFERRING DIAG:C50.112,Z17.0 (ICD-10-CM) - Malignant neoplasm of central portion of left breast in female, estrogen receptor positive (HCC)  THERAPY DIAG:  Lymphedema, not elsewhere classified  Disorder of  the skin and subcutaneous tissue related to radiation, unspecified  Abnormal posture  Rationale for Evaluation and Treatment: Rehabilitation  ONSET DATE: 07/18/2022  SUBJECTIVE:                                                                                                                                                                                           SUBJECTIVE STATEMENT: I haven't got to call [employer] yet but I did find one and am wearing it.   PERTINENT HISTORY:  Patient was diagnosed  on 07/18/2022 with left grade 2 invasive ductal carcinoma breast cancer. It measures 1.9 cm and is located in the central quadrant. It is ER/PR positive and HER2 negative with a Ki67 of 10%. 09/14/22 L breast lumpectomy and SLNB 1/3. Completed radiation. Hx of chronic pain, degenerative disc disease, PTSD  PATIENT GOALS:   to decrease the pain under the arm and on the trunk  PAIN:  Are you having pain? Yes: NPRS scale: 2/10 Pain location: axilla Pain description: dull, ache Aggravating factors: lying down Relieving factors: lying supine  PRECAUTIONS: at risk for lymphedema Fall, DDD  HAND DOMINANCE: right  WEIGHT BEARING RESTRICTIONS: No  FALLS:  Has patient fallen in last 6 months? No  LIVING ENVIRONMENT: Patient lives with: her daughter Lives in: House/apartment Has following equipment at home: Single point cane  OCCUPATION: On disability  LEISURE: Walks some in her yard  PRIOR LEVEL OF FUNCTION: Independent   OBJECTIVE:  COGNITION: Overall cognitive status: Within functional limits for tasks assessed    POSTURE:  Forward head and rounded shoulders posture  UPPER EXTREMITY AROM/PROM:  A/PROM RIGHT   08/22/22   Shoulder extension 40  Shoulder flexion 146  Shoulder abduction 149  Shoulder internal rotation 67  Shoulder external rotation 77    (Blank rows = not tested)  A/PROM LEFT   08/22/22 LEFT  02/20/23  Shoulder extension 32 44  Shoulder flexion 139  155  Shoulder abduction 152 148  Shoulder internal rotation 60 52  Shoulder external rotation 75 88    (Blank rows = not tested)     UPPER EXTREMITY STRENGTH: WFL  LYMPHEDEMA ASSESSMENTS:   LANDMARK RIGHT   08/22/22 RIGHT 02/20/23  10 cm proximal to olecranon process 40 40  Olecranon process 27.5 27.3  10 cm proximal to ulnar styloid process 25.2 23.8  Just proximal to ulnar styloid process 15.8 16  Across hand at thumb web space 17.9 18  At base of 2nd digit 5.9 5.9  (Blank rows = not tested)  LANDMARK LEFT   08/22/22 LEFT 02/20/23  10 cm proximal to olecranon process 40.2 37  Olecranon process 26.5 27.4  10 cm proximal to ulnar styloid process 25.1 24.7  Just proximal to ulnar styloid process 16.3 15.9  Across hand at thumb web space 18.3 18.4  At base of 2nd digit 6.5 6.1  (Blank rows = not tested)  L-DEX LYMPHEDEMA SCREENING:  The patient was assessed using the L-Dex machine today to produce a lymphedema index baseline score. The patient will be reassessed on a regular basis (typically every 3 months) to obtain new L-Dex scores. If the score is > 6.5 points away from his/her baseline score indicating onset of subclinical lymphedema, it will be recommended to wear a compression garment for 4 weeks, 12 hours per day and then be reassessed. If the score continues to be > 6.5 points from baseline at reassessment, we will initiate lymphedema treatment. Assessing in this manner has a 95% rate of preventing clinically significant lymphedema.     LLIS: 33.82  QUICK DASH:   TODAY'S DATE:  02/26/23 Manual Therapy MLD to Lt breast in supine as follows: Short neck, 5 diaphragmatic breaths, Lt inguinal and Rt axillary nodes, Lt axillo-inguinal and anterior inter-axillary anastomosis, then focused on Lt superior breast, Rt S/L for lateral breast focus redirecting towards lateral anastomosis, then finished retracing steps in supine beginning to instruct pt in basics of anatomy of  lymphatic system and principles of MLD.  P/ROM to Lt shoulder  into flexion, abd and D2 to pts end motions with scapular depression by therapist throughout Rt S/L for STM to medial scapular border where mild tightness palpable   PATIENT EDUCATION:  Education details: lymphedema, anatomy and physiology of the lymphatic system, stop ice due to risk of worsening lymphedema Person educated: Patient Education method: Explanation Education comprehension: Patient verbalized   HOME EXERCISE PROGRAM: Wear compression bra as much as possible Contact 2nd to Nature to obtain another compression bra   ASSESSMENT:  CLINICAL IMPRESSION: First sessio of manual therapy instructing pt in MLD while performing today. She reports her Lt shoulder feeling looser at end of session.  Pt will benefit from skilled therapeutic intervention to improve on the following deficits: Decreased knowledge of precautions, pain, decreased ROM, postural dysfunction, decreased knowledge of condition, increased edema   PT treatment/interventions: ADL/Self care home management, Therapeutic exercises, Therapeutic activity, Patient/Family education, Self Care, Orthotic/Fit training, Manual lymph drainage, Compression bandaging, scar mobilization, Taping, Vasopneumatic device, Manual therapy, and Re-evaluation   REHAB POTENTIAL: Good  CLINICAL DECISION MAKING: Stable/uncomplicated  EVALUATION COMPLEXITY: Low   GOALS: Goals reviewed with patient? YES  LONG TERM GOALS: (STG=LTG)    Name Target Date Goal status  1 Pt will be independent in self MLD for long term management of L breast lymphedema.    03/20/2023 NEW  2 Pt will obtain appropriate compression bra for long term management of lymphedema. 03/20/23 NEW    3 Pt will report a 75% decrease in pain in her L breast and axilla to allow improved comfort.   03/20/23 NEW  4 Pt will be independent in a home exercise program for continued stretching and strengthening.    03/20/23 NEW    PLAN:  PT FREQUENCY/DURATION: 2x/wk for 4 wks  PLAN FOR NEXT SESSION: Cont MLD to L breast and instruct, did she contact 2nd to nature, STM to L axilla/MFR  Berna Spare, PTA 02/26/23 9:04 AM

## 2023-02-27 ENCOUNTER — Ambulatory Visit: Payer: 59 | Admitting: Hematology and Oncology

## 2023-03-05 ENCOUNTER — Inpatient Hospital Stay: Payer: 59

## 2023-03-05 ENCOUNTER — Inpatient Hospital Stay: Payer: 59 | Attending: Hematology and Oncology | Admitting: Hematology and Oncology

## 2023-03-05 VITALS — BP 128/74 | HR 73 | Temp 97.7°F | Resp 19 | Ht 63.0 in | Wt 256.4 lb

## 2023-03-05 DIAGNOSIS — Z170 Estrogen receptor positive status [ER+]: Secondary | ICD-10-CM | POA: Diagnosis not present

## 2023-03-05 DIAGNOSIS — Z79811 Long term (current) use of aromatase inhibitors: Secondary | ICD-10-CM | POA: Insufficient documentation

## 2023-03-05 DIAGNOSIS — Z90710 Acquired absence of both cervix and uterus: Secondary | ICD-10-CM | POA: Insufficient documentation

## 2023-03-05 DIAGNOSIS — Z803 Family history of malignant neoplasm of breast: Secondary | ICD-10-CM | POA: Diagnosis not present

## 2023-03-05 DIAGNOSIS — C50112 Malignant neoplasm of central portion of left female breast: Secondary | ICD-10-CM

## 2023-03-05 DIAGNOSIS — Z801 Family history of malignant neoplasm of trachea, bronchus and lung: Secondary | ICD-10-CM | POA: Diagnosis not present

## 2023-03-05 LAB — CBC WITH DIFFERENTIAL (CANCER CENTER ONLY)
Abs Immature Granulocytes: 0.01 10*3/uL (ref 0.00–0.07)
Basophils Absolute: 0 10*3/uL (ref 0.0–0.1)
Basophils Relative: 1 %
Eosinophils Absolute: 0.1 10*3/uL (ref 0.0–0.5)
Eosinophils Relative: 3 %
HCT: 39.7 % (ref 36.0–46.0)
Hemoglobin: 13.1 g/dL (ref 12.0–15.0)
Immature Granulocytes: 0 %
Lymphocytes Relative: 28 %
Lymphs Abs: 0.7 10*3/uL (ref 0.7–4.0)
MCH: 29.6 pg (ref 26.0–34.0)
MCHC: 33 g/dL (ref 30.0–36.0)
MCV: 89.8 fL (ref 80.0–100.0)
Monocytes Absolute: 0.2 10*3/uL (ref 0.1–1.0)
Monocytes Relative: 6 %
Neutro Abs: 1.6 10*3/uL — ABNORMAL LOW (ref 1.7–7.7)
Neutrophils Relative %: 62 %
Platelet Count: 124 10*3/uL — ABNORMAL LOW (ref 150–400)
RBC: 4.42 MIL/uL (ref 3.87–5.11)
RDW: 14.6 % (ref 11.5–15.5)
WBC Count: 2.7 10*3/uL — ABNORMAL LOW (ref 4.0–10.5)
nRBC: 0 % (ref 0.0–0.2)

## 2023-03-05 LAB — CMP (CANCER CENTER ONLY)
ALT: 11 U/L (ref 0–44)
AST: 13 U/L — ABNORMAL LOW (ref 15–41)
Albumin: 3.9 g/dL (ref 3.5–5.0)
Alkaline Phosphatase: 48 U/L (ref 38–126)
Anion gap: 8 (ref 5–15)
BUN: 12 mg/dL (ref 6–20)
CO2: 26 mmol/L (ref 22–32)
Calcium: 9.1 mg/dL (ref 8.9–10.3)
Chloride: 107 mmol/L (ref 98–111)
Creatinine: 0.8 mg/dL (ref 0.44–1.00)
GFR, Estimated: >60 mL/min (ref >60)
Glucose, Bld: 106 mg/dL — ABNORMAL HIGH (ref 70–99)
Potassium: 3.6 mmol/L (ref 3.5–5.1)
Sodium: 141 mmol/L (ref 135–145)
Total Bilirubin: 0.4 mg/dL (ref 0.3–1.2)
Total Protein: 6.5 g/dL (ref 6.5–8.1)

## 2023-03-05 NOTE — Assessment & Plan Note (Addendum)
This is a very pleasant 62 year old female patient with 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   Given early stage, strong ER/PR positivity and no evidence of lymph node involvement we have discussed about upfront surgery followed by Oncotype testing. We have discussed final pathology which showed 1 lymph node involvement malignancy. Oncotype resulted at 15,no benefit from chemotherapy.  We have reviewed that she should proceed with adjuvant radiation followed by antiestrogen +/- abema. She is tolerating current dose of abemaciclib well, mild nausea using Zofran and Compazine as needed.  If she is unable to tolerate it, she understands that it is most important to continue anastrozole and orbit abemaciclib.  Her most recent labs are without any significant abnormalities.  She will return to clinic in about 8 weeks.

## 2023-03-05 NOTE — Progress Notes (Signed)
[facility] CONSULT NOTE  Patient Care Team: Frederich Chick., MD as PCP - General (Family Medicine) Rachel Moulds, MD as Consulting Physician (Hematology and Oncology) Griselda Miner, MD as Consulting Physician (General Surgery) Antony Blackbird, MD as Consulting Physician (Radiation Oncology) Pershing Proud, RN as Oncology Nurse Navigator Donnelly Angelica, RN as Oncology Nurse Navigator Anselm Lis, RPH-CPP as Pharmacist (Hematology and Oncology)  CHIEF COMPLAINTS/PURPOSE OF CONSULTATION:  Breast cancer follow up.  HISTORY OF PRESENTING ILLNESS:  Kelli Estrada 62 y.o. female is here because of recent diagnosis of left breast cancer  I reviewed her records extensively and collaborated the history with the patient.  SUMMARY OF ONCOLOGIC HISTORY: Oncology History  Malignant neoplasm of central portion of left breast in female, estrogen receptor positive (HCC)  07/18/2022 Imaging   Mammogram showed indeterminate left breast mass central to the nipple in the retroareolar region, indeterminate lymph node.  Ultrasound done showed 1.9 x 1.4 x 1.5 cm taller than wide irregular mass in the left breast highly suggestive of malignancy.  No significant abnormality seen in the left axilla   08/16/2022 Pathology Results   Pathology from the left breast mass showed grade 2 invasive ductal carcinoma ER 100% positive strong staining PR 90% positive strong staining Ki-67 of 10% and HER2 1+ by [facility]   08/20/2022 Initial Diagnosis   Malignant neoplasm of central portion of left breast in female, estrogen receptor positive (HCC)   08/22/2022 Cancer Staging   Staging form: Breast, AJCC 8th Edition - Pathologic: Stage IB (pT2, pN1, cM0, G2, ER+, PR+, HER2-, Oncotype DX score: 15) - Signed by Rachel Moulds, MD on 03/05/2023 Multigene prognostic tests performed: Oncotype DX Recurrence score range: Greater than or equal to 11 Histologic grading system: 3 grade system   08/27/2022  Genetic Testing   Negative CustomNext-Cancer +RNAinsight Panel.  Report date is 08/29/2022.   The CustomNext-Cancer+RNAinsight panel offered by [employer] includes sequencing and rearrangement analysis for the following 47 genes:  APC, ATM, AXIN2, BARD1, BMPR1A, BRCA1, BRCA2, BRIP1, CDH1, CDK4, CDKN2A, CHEK2, DICER1, EPCAM, GREM1, HOXB13, MEN1, MLH1, MSH2, MSH3, MSH6, MUTYH, NBN, NF1, NF2, NTHL1, PALB2, PMS2, POLD1, POLE, PTEN, RAD51C, RAD51D, RECQL, RET, SDHA, SDHAF2, SDHB, SDHC, SDHD, SMAD4, SMARCA4, STK11, TP53, TSC1, TSC2, and VHL.  RNA data is routinely analyzed for use in variant interpretation for all genes.   10/04/2022 Oncotype testing   Oncotype DX of 15, no role for adj chemo.    Interval History  Since last visit with [name], She continues to deal with nausea. She has been taking zofran and compazine as needed. She says the nausea is pretty bothersome. She has some mild diarrhea intermittently.  She says she is tolerating it well overall. Rest of the pertinent 10 point ROS reviewed and negative  MEDICAL HISTORY:  Past Medical History:  Diagnosis Date   Anxiety    Chronic pain    Depression    Family history of breast cancer 08/23/2022   H/O degenerative disc disease    History of radiation therapy    Left breast- 10/25/22-12/11/22- Dr. Antony Blackbird   History of radiation therapy    Left breast-10/25/22-12/11/22- Dr. Antony Blackbird   Pre-diabetes    PTSD (post-traumatic stress disorder)    Sleep apnea     SURGICAL HISTORY: Past Surgical History:  Procedure Laterality Date   ABDOMINAL HYSTERECTOMY     BREAST LUMPECTOMY WITH SENTINEL LYMPH NODE BIOPSY Left 09/14/2022   Procedure: LEFT BREAST CENTRAL LUMPECTOMY WITH  SENTINEL LYMPH NODE BX;  Surgeon: Griselda Miner, MD;  Location: [facility];  Service: General;  Laterality: Left;   COLONOSCOPY WITH PROPOFOL N/A 03/15/2016   Procedure: COLONOSCOPY WITH PROPOFOL;  Surgeon: Dorena Cookey, MD;  Location: [facility];  Service: Endoscopy;  Laterality: N/A;   NECK SURGERY      SOCIAL HISTORY: Social History   Socioeconomic History   Marital status: Single    Spouse name: Not on file   Number of children: Not on file   Years of education: Not on file   Highest education level: Not on file  Occupational History   Not on file  Tobacco Use   Smoking status: Never   Smokeless tobacco: Not on file  Vaping Use   Vaping Use: Never used  Substance and Sexual Activity   Alcohol use: Not Currently    Comment: occasionally    Drug use: No   Sexual activity: Not Currently    Birth control/protection: Surgical  Other Topics Concern   Not on file  Social History Narrative   Not on file   Social Determinants of Health   Financial Resource Strain: Medium Risk (10/18/2022)   Overall Financial Resource Strain (CARDIA)    Difficulty of Paying Living Expenses: Somewhat hard  Food Insecurity: Not on file  Transportation Needs: Not on file  Physical Activity: Not on file  Stress: Not on file  Social Connections: Not on file  Intimate Partner Violence: Not on file    FAMILY HISTORY: Family History  Problem Relation Age of Onset   Lung cancer Mother        dx > 50   Breast cancer Maternal Grandmother 27    ALLERGIES:  has No Known Allergies.  MEDICATIONS:  Current Outpatient Medications  Medication Sig Dispense Refill   abemaciclib (VERZENIO) 100 MG tablet Take 1 tablet (100 mg total) by mouth 2 (two) times daily. 56 tablet 1   albuterol (VENTOLIN HFA) 108 (90 Base) MCG/ACT inhaler SMARTSIG:1 Puff(s) Via Inhaler 4 Times Daily PRN     alendronate (FOSAMAX) 70 MG tablet Take 70 mg by mouth once a week.     anastrozole (ARIMIDEX) 1 MG tablet Take 1 tablet (1 mg total) by mouth daily. 90 tablet 3   aspirin 500 MG EC tablet Take 500 mg by mouth every 6 (six) hours as needed for pain.     Blood Glucose Monitoring Suppl (ONETOUCH VERIO REFLECT) w/Device KIT See admin instructions.     [employer]  VITAMIN E 180 MG (400 UNIT) CAPS Take 2 capsules by mouth daily.     fenofibrate (TRICOR) 48 MG tablet Take 40 mg by mouth daily.     Fluticasone-Umeclidin-Vilant (TRELEGY ELLIPTA) 100-62.5-25 MCG/ACT AEPB Inhale 100 mcg into the lungs 1 day or 1 dose.     gabapentin (NEURONTIN) 300 MG capsule Take 300 mg by mouth 3 (three) times daily.     Ginger 500 MG CAPS Take by mouth.     hydrOXYzine (VISTARIL) 25 MG capsule Take 25 mg by mouth 2 (two) times daily as needed for anxiety.   0   loperamide (IMODIUM) 2 MG capsule Take 2 mg by mouth as needed for diarrhea or loose stools.     metFORMIN (GLUCOPHAGE) 500 MG tablet SMARTSIG:1 Tablet(s) By Mouth Every Evening     mirabegron ER (MYRBETRIQ) 25 MG TB24 tablet Take 1 tablet (25 mg total) by mouth daily. 30 tablet 11   omeprazole (PRILOSEC OTC) 20 MG tablet Take  20 mg by mouth daily.     ondansetron (ZOFRAN) 8 MG tablet Take 1 tablet (8 mg total) by mouth every 8 (eight) hours as needed for nausea or vomiting. 30 tablet 2   Oxcarbazepine (TRILEPTAL) 300 MG tablet Take 300 mg by mouth 2 (two) times daily.     oxyCODONE-acetaminophen (PERCOCET/ROXICET) 5-325 MG tablet Take 1 tablet by mouth every 6 (six) hours as needed (For pain.).      OZEMPIC, 1 MG/DOSE, 4 MG/3ML SOPN Inject 1 mg into the skin once a week.     prochlorperazine (COMPAZINE) 10 MG tablet Take 1 tablet (10 mg total) by mouth every 6 (six) hours as needed for nausea or vomiting. 30 tablet 2   rosuvastatin (CRESTOR) 20 MG tablet Take 20 mg by mouth at bedtime.     sertraline (ZOLOFT) 100 MG tablet Take 100 mg by mouth 2 (two) times daily.  2   tamsulosin (FLOMAX) 0.4 MG CAPS capsule Take 1 capsule (0.4 mg total) by mouth daily. 90 capsule 2   topiramate (TOPAMAX) 100 MG tablet Take 100 mg by mouth daily.     No current facility-administered medications for this visit.    REVIEW OF SYSTEMS:   Constitutional: Denies fevers, chills or abnormal night sweats Eyes: Denies blurriness of vision,  double vision or watery eyes Ears, nose, mouth, throat, and face: Denies mucositis or sore throat Respiratory: Denies cough, dyspnea or wheezes Cardiovascular: Denies palpitation, chest discomfort or lower extremity swelling Gastrointestinal:  Denies nausea, heartburn or change in bowel habits Skin: Denies abnormal skin rashes Lymphatics: Denies new lymphadenopathy or easy bruising Neurological:Denies numbness, tingling or new weaknesses Behavioral/Psych: Mood is stable, no new changes  Breast: Denies any palpable lumps or discharge All other systems were reviewed with the patient and are negative.  PHYSICAL EXAMINATION: ECOG PERFORMANCE STATUS: 0 - Asymptomatic  Physical Exam Constitutional:      Appearance: Normal appearance.  Cardiovascular:     Rate and Rhythm: Normal rate and regular rhythm.  Abdominal:     General: Abdomen is flat.     Palpations: Abdomen is soft.  Musculoskeletal:        General: No swelling or tenderness. Normal range of motion.     Cervical back: Normal range of motion. No rigidity.  Lymphadenopathy:     Cervical: No cervical adenopathy.  Skin:    General: Skin is warm and dry.  Neurological:     General: No focal deficit present.     Mental Status: She is alert.      LABORATORY DATA:  I have reviewed the data as listed Lab Results  Component Value Date   WBC 2.7 (L) 03/05/2023   HGB 13.1 03/05/2023   HCT 39.7 03/05/2023   MCV 89.8 03/05/2023   PLT 124 (L) 03/05/2023   Lab Results  Component Value Date   NA 141 03/05/2023   K 3.6 03/05/2023   CL 107 03/05/2023   CO2 26 03/05/2023    RADIOGRAPHIC STUDIES: I have personally reviewed the radiological reports and agreed with the findings in the report.  ASSESSMENT AND PLAN:  Malignant neoplasm of central portion of left breast in female, estrogen receptor positive (HCC) This is a very pleasant 62 year old female patient with newly diagnosed left breast invasive ductal carcinoma T1N0  grade 2 ER 100% positive strong staining PR 90% positive strong staining Ki-67 of 10% and HER2 1+ by IHC referred to breast MDC for additional recommendations.   Given early stage, strong ER/PR positivity  and no evidence of lymph node involvement we have discussed about upfront surgery followed by Oncotype testing. We have discussed final pathology which showed 1 lymph node involvement malignancy. Oncotype resulted at 15,no benefit from chemotherapy.  We have reviewed that she should proceed with adjuvant radiation followed by antiestrogen +/- abema. She is tolerating current dose of abemaciclib well, mild nausea using Zofran and Compazine as needed.  If she is unable to tolerate it, she understands that it is most important to continue anastrozole and orbit abemaciclib.  Her most recent labs are without any significant abnormalities.  She will return to clinic in about 8 weeks.   Total time spent: 30 minutes including history, physical exam, review of records, counseling and coordination of care All questions were answered. The patient knows to call the clinic with any problems, questions or concerns.    Rachel Moulds, MD 03/05/23

## 2023-03-06 ENCOUNTER — Ambulatory Visit: Payer: 59

## 2023-03-06 DIAGNOSIS — I890 Lymphedema, not elsewhere classified: Secondary | ICD-10-CM

## 2023-03-06 DIAGNOSIS — L599 Disorder of the skin and subcutaneous tissue related to radiation, unspecified: Secondary | ICD-10-CM

## 2023-03-06 DIAGNOSIS — R293 Abnormal posture: Secondary | ICD-10-CM

## 2023-03-06 NOTE — Patient Instructions (Addendum)
  Copyright  VHI. All rights reserved.   

## 2023-03-06 NOTE — Therapy (Signed)
OUTPATIENT PHYSICAL THERAPY BREAST CANCER TREATMENT   Patient Name: Kelli Estrada MRN: [id number] DOB:03/15/1961, 62 y.o., female Today's Date: 03/06/2023   PT End of Session - 03/06/23 0847     Visit Number 3    Number of Visits 9    Date for PT Re-Evaluation 03/20/23    PT Start Time 0850    PT Stop Time 0955    PT Time Calculation (min) 65 min    Activity Tolerance Patient tolerated treatment well    Behavior During Therapy [facility] for tasks assessed/performed              Past Medical History:  Diagnosis Date   Anxiety    Chronic pain    Depression    Family history of breast cancer 08/23/2022   H/O degenerative disc disease    History of radiation therapy    Left breast- 10/25/22-12/11/22- Dr. Antony Blackbird   History of radiation therapy    Left breast-10/25/22-12/11/22- Dr. Antony Blackbird   Pre-diabetes    PTSD (post-traumatic stress disorder)    Sleep apnea    Past Surgical History:  Procedure Laterality Date   ABDOMINAL HYSTERECTOMY     BREAST LUMPECTOMY WITH SENTINEL LYMPH NODE BIOPSY Left 09/14/2022   Procedure: LEFT BREAST CENTRAL LUMPECTOMY WITH SENTINEL LYMPH NODE BX;  Surgeon: Griselda Miner, MD;  Location: [facility];  Service: General;  Laterality: Left;   COLONOSCOPY WITH PROPOFOL N/A 03/15/2016   Procedure: COLONOSCOPY WITH PROPOFOL;  Surgeon: Dorena Cookey, MD;  Location: [facility];  Service: Endoscopy;  Laterality: N/A;   NECK SURGERY     Patient Active Problem List   Diagnosis Date Noted   Genetic testing 08/27/2022   Family history of breast cancer 08/23/2022   Malignant neoplasm of central portion of left breast in female, estrogen receptor positive (HCC) 08/20/2022    REFERRING PROVIDER: Erven Colla, PA-C  REFERRING DIAG:C50.112,Z17.0 (ICD-10-CM) - Malignant neoplasm of central portion of left breast in female, estrogen receptor positive (HCC)  THERAPY DIAG:  Lymphedema, not elsewhere classified  Disorder of the skin and  subcutaneous tissue related to radiation, unspecified  Abnormal posture  Rationale for Evaluation and Treatment: Rehabilitation  ONSET DATE: 07/18/2022  SUBJECTIVE:                                                                                                                                                                                           SUBJECTIVE STATEMENT: I haven't called [employer] but I'm been wearing the one I found and that's going well.   PERTINENT HISTORY:  Patient was diagnosed on 07/18/2022 with left  grade 2 invasive ductal carcinoma breast cancer. It measures 1.9 cm and is located in the central quadrant. It is ER/PR positive and HER2 negative with a Ki67 of 10%. 09/14/22 L breast lumpectomy and SLNB 1/3. Completed radiation. Hx of chronic pain, degenerative disc disease, PTSD  PATIENT GOALS:   to decrease the pain under the arm and on the trunk  PAIN:  Are you having pain? Yes: NPRS scale: 3.5/10 Pain location: axilla, breast and lateral superior trunk Pain description: dull, ache Aggravating factors: lying down Relieving factors: lying supine  PRECAUTIONS: at risk for lymphedema Fall, DDD  HAND DOMINANCE: right  WEIGHT BEARING RESTRICTIONS: No  FALLS:  Has patient fallen in last 6 months? No  LIVING ENVIRONMENT: Patient lives with: her daughter Lives in: House/apartment Has following equipment at home: Single point cane  OCCUPATION: On disability  LEISURE: Walks some in her yard  PRIOR LEVEL OF FUNCTION: Independent   OBJECTIVE:  COGNITION: Overall cognitive status: Within functional limits for tasks assessed    POSTURE:  Forward head and rounded shoulders posture  UPPER EXTREMITY AROM/PROM:  A/PROM RIGHT   08/22/22   Shoulder extension 40  Shoulder flexion 146  Shoulder abduction 149  Shoulder internal rotation 67  Shoulder external rotation 77    (Blank rows = not tested)  A/PROM LEFT   08/22/22 LEFT  02/20/23  Shoulder  extension 32 44  Shoulder flexion 139 155  Shoulder abduction 152 148  Shoulder internal rotation 60 52  Shoulder external rotation 75 88    (Blank rows = not tested)     UPPER EXTREMITY STRENGTH: WFL  LYMPHEDEMA ASSESSMENTS:   LANDMARK RIGHT   08/22/22 RIGHT 02/20/23  10 cm proximal to olecranon process 40 40  Olecranon process 27.5 27.3  10 cm proximal to ulnar styloid process 25.2 23.8  Just proximal to ulnar styloid process 15.8 16  Across hand at thumb web space 17.9 18  At base of 2nd digit 5.9 5.9  (Blank rows = not tested)  LANDMARK LEFT   08/22/22 LEFT 02/20/23  10 cm proximal to olecranon process 40.2 37  Olecranon process 26.5 27.4  10 cm proximal to ulnar styloid process 25.1 24.7  Just proximal to ulnar styloid process 16.3 15.9  Across hand at thumb web space 18.3 18.4  At base of 2nd digit 6.5 6.1  (Blank rows = not tested)  L-DEX LYMPHEDEMA SCREENING:  The patient was assessed using the L-Dex machine today to produce a lymphedema index baseline score. The patient will be reassessed on a regular basis (typically every 3 months) to obtain new L-Dex scores. If the score is > 6.5 points away from his/her baseline score indicating onset of subclinical lymphedema, it will be recommended to wear a compression garment for 4 weeks, 12 hours per day and then be reassessed. If the score continues to be > 6.5 points from baseline at reassessment, we will initiate lymphedema treatment. Assessing in this manner has a 95% rate of preventing clinically significant lymphedema.     LLIS: 33.82  QUICK DASH:   TODAY'S DATE:  03/06/23: Therapeutic Exercises Pulleys into flexion and abd x 2 mins each returning therapist demo and VC's to decrease Lt scapular compensation Roll ball up wall into flexion and abd x 10 each returning therapist demo Added to HEP: Seated EOB for AA/ROM with dowel for Lt shoulder flexion and abd x 10 each and then supine with dowel for flexion, then Lt  horz abd into abd x 10,  Lt er, and then fingers clasped behind head and abd elbows for pectoralis stretch 5c, all 5 sec holds and returning therapist demo for each Manual Therapy MLD to Lt breast in supine as follows: Short neck, 5 diaphragmatic breaths, Lt inguinal and Rt axillary nodes, Lt axillo-inguinal and anterior inter-axillary anastomosis, then focused on Lt superior breast, Rt S/L for lateral breast focus redirecting towards lateral anastomosis, then finished retracing steps in supine instructing pt while performing and having her return demo of each step using VC's and hand over hand pressure for instruction of correct light pressure.  P/ROM to Lt shoulder into flexion, abd and D2 to pts end motions with scapular depression by therapist throughout Rt S/L for STM to medial scapular border where mild tightness palpable  02/26/23 Manual Therapy MLD to Lt breast in supine as follows: Short neck, 5 diaphragmatic breaths, Lt inguinal and Rt axillary nodes, Lt axillo-inguinal and anterior inter-axillary anastomosis, then focused on Lt superior breast, Rt S/L for lateral breast focus redirecting towards lateral anastomosis, then finished retracing steps in supine beginning to instruct pt in basics of anatomy of lymphatic system and principles of MLD.  P/ROM to Lt shoulder into flexion, abd and D2 to pts end motions with scapular depression by therapist throughout Rt S/L for STM to medial scapular border where mild tightness palpable   PATIENT EDUCATION:  Education details: AA/ROM with dowel and Self MLD for Lt breast Person educated: Patient Education method: Explanation, Demonstration, tactile and VC's and handout issued Education comprehension: Patient verbalized understanding, returned demo and will benefit from further review  HOME EXERCISE PROGRAM: Wear compression bra as much as possible Contact 2nd to [employer] to obtain another compression bra Seated and supine AA/ROM Lt UE dowel  exercises and Self Lt breast MLD   ASSESSMENT:  CLINICAL IMPRESSION: Continued with manual therapy but also progressed pt to include AA/ROM exercises and issued these as HEP. Pt tolerated these well without c/o pain. Also issued handout for Lt breast self MLD and instructing pt. She will benefit from further reinforcement of this for technique and pressure.  Pt will benefit from skilled therapeutic intervention to improve on the following deficits: Decreased knowledge of precautions, pain, decreased ROM, postural dysfunction, decreased knowledge of condition, increased edema   PT treatment/interventions: ADL/Self care home management, Therapeutic exercises, Therapeutic activity, Patient/Family education, Self Care, Orthotic/Fit training, Manual lymph drainage, Compression bandaging, scar mobilization, Taping, Vasopneumatic device, Manual therapy, and Re-evaluation   REHAB POTENTIAL: Good  CLINICAL DECISION MAKING: Stable/uncomplicated  EVALUATION COMPLEXITY: Low   GOALS: Goals reviewed with patient? YES  LONG TERM GOALS: (STG=LTG)    Name Target Date Goal status  1 Pt will be independent in self MLD for long term management of L breast lymphedema.    03/20/2023 NEW  2 Pt will obtain appropriate compression bra for long term management of lymphedema. 03/20/23 NEW    3 Pt will report a 75% decrease in pain in her L breast and axilla to allow improved comfort.   03/20/23 NEW  4 Pt will be independent in a home exercise program for continued stretching and strengthening.   03/20/23 NEW    PLAN:  PT FREQUENCY/DURATION: 2x/wk for 4 wks  PLAN FOR NEXT SESSION: Cont MLD to L breast and review, cont STM to L axilla/MFR and cont/review AA/ROM exs and progress HEP prn  Berna Spare, PTA 03/06/23 10:02 AM  Flexion (Eccentric) - Active-Assist Gilmer Mor)          Cancer Rehab [phone]  Use unaffected arm to push affected arm forward. Avoid hiking shoulder (shoulder should NOT  touch cheek). Keep palm relaxed. Slowly lower affected arm. Hold stretch for _5_ seconds repeating _5-10_ times, _1-2_ times a day.  Abduction (Eccentric) - Active-Assist (Cane)    Use unaffected arm to push affected arm out to side. Avoid hiking shoulder (shoulder should NOT touch cheek). Keep palm relaxed. Slowly lower affected arm. Hold stretch _5_ seconds repeating _5-10_ times, _1-2_ times a day.    SHOULDER: Flexion - Supine (Cane)            Hold cane in both hands. Raise arms up overhead. Do not allow back to arch. Hold _5__ seconds. Do __5-10__ times; __1-2__ times a day.   SELF ASSISTED WITH OBJECT: Shoulder Abduction / Adduction - Supine    Hold cane with both hands. Move both arms from side to side, keep elbows straight.  Hold when stretch felt for __5__ seconds. Repeat __5-10__ times; __1-2__ times a day. Once this becomes easier progress to third picture bringing affected arm towards ear by staying out to side. Same hold for _5_seconds. Repeat  _5-10_ times, _1-2_ times/day.  Shoulder Blade Stretch    Clasp fingers behind head with elbows touching in front of face. Pull elbows back while pressing shoulder blades together. Relax and hold as tolerated, can place pillow under elbow here for comfort as needed and to allow for prolonged stretch.  Repeat __5__ times. Do __1-2__ sessions per day.  SHOULDER: External Rotation - Supine (Cane)    Hold cane with both hands. Rotate arm away from body. Keep elbow on floor and next to body. _5-10__ reps per set, hold 5 seconds, _1-2__ sets per day. Add towel to keep elbow at side.  Self manual lymph drainage:    Cancer Rehab [phone] Perform this sequence once a day.  Only give enough pressure no your skin to make the skin move.  Start with circles near neck behind collarbones 10 times each side.   Diaphragmatic - Supine   Inhale through nose making navel move out toward hands. Exhale through puckered lips, hands follow  navel in. Repeat _5__ times. Rest _10__ seconds between repeats.   Copyright  VHI. All rights reserved.  Hug yourself.  Do circles at your neck just above your collarbones.  Repeat this 10 times.  Axilla - One at a Time   Using full weight of flat hand and fingers at center of uninvolved armpit, make _10__ in-place circles.   Copyright  VHI. All rights reserved.  LEG: Inguinal Nodes Stimulation   With small finger side of hand against hip crease on involved side, gently perform circles at the crease. Repeat __10_ times.   Copyright  VHI. All rights reserved.  Axilla to Inguinal Nodes - Sweep   On involved side, pump _4__ times from armpit along side of trunk to hip crease.  Now gently stretch skin from the involved side to the uninvolved side across the chest at the shoulder line.  Repeat that 4 times.  Draw an imaginary diagonal line from upper outer breast through the nipple area toward lower inner breast.  Direct fluid upward and inward from this line toward the pathway across your upper chest .  Do this in three rows to treat all of the upper inner breast tissue, and do each row 3-4x.      Direct fluid to treat all of lower outer breast tissue downward and outward toward  pathway that is aimed at  the left groin.  Finish by doing the pathways as described above going from your involved armpit to the same side groin and going across your upper chest from the involved shoulder to the uninvolved shoulder.  Repeat the steps above where you do circles in your left groin and right armpit.

## 2023-03-12 ENCOUNTER — Other Ambulatory Visit (HOSPITAL_COMMUNITY): Payer: Self-pay

## 2023-03-13 ENCOUNTER — Other Ambulatory Visit (HOSPITAL_COMMUNITY): Payer: Self-pay

## 2023-03-13 ENCOUNTER — Ambulatory Visit: Payer: 59

## 2023-03-13 DIAGNOSIS — R293 Abnormal posture: Secondary | ICD-10-CM

## 2023-03-13 DIAGNOSIS — I890 Lymphedema, not elsewhere classified: Secondary | ICD-10-CM | POA: Diagnosis not present

## 2023-03-13 DIAGNOSIS — L599 Disorder of the skin and subcutaneous tissue related to radiation, unspecified: Secondary | ICD-10-CM

## 2023-03-13 NOTE — Therapy (Signed)
OUTPATIENT PHYSICAL THERAPY BREAST CANCER TREATMENT   Patient Name: Kelli Estrada MRN: [id number] DOB:1961/09/22, 62 y.o., female Today's Date: 03/13/2023   PT End of Session - 03/13/23 0906     Visit Number 4    Number of Visits 9    Date for PT Re-Evaluation 03/20/23    PT Start Time 0904    PT Stop Time 0959    PT Time Calculation (min) 55 min    Activity Tolerance Patient tolerated treatment well    Behavior During Therapy [facility] for tasks assessed/performed              Past Medical History:  Diagnosis Date   Anxiety    Chronic pain    Depression    Family history of breast cancer 08/23/2022   H/O degenerative disc disease    History of radiation therapy    Left breast- 10/25/22-12/11/22- Dr. Antony Blackbird   History of radiation therapy    Left breast-10/25/22-12/11/22- Dr. Antony Blackbird   Pre-diabetes    PTSD (post-traumatic stress disorder)    Sleep apnea    Past Surgical History:  Procedure Laterality Date   ABDOMINAL HYSTERECTOMY     BREAST LUMPECTOMY WITH SENTINEL LYMPH NODE BIOPSY Left 09/14/2022   Procedure: LEFT BREAST CENTRAL LUMPECTOMY WITH SENTINEL LYMPH NODE BX;  Surgeon: Griselda Miner, MD;  Location: [facility];  Service: General;  Laterality: Left;   COLONOSCOPY WITH PROPOFOL N/A 03/15/2016   Procedure: COLONOSCOPY WITH PROPOFOL;  Surgeon: Dorena Cookey, MD;  Location: [facility];  Service: Endoscopy;  Laterality: N/A;   NECK SURGERY     Patient Active Problem List   Diagnosis Date Noted   Genetic testing 08/27/2022   Family history of breast cancer 08/23/2022   Malignant neoplasm of central portion of left breast in female, estrogen receptor positive (HCC) 08/20/2022    REFERRING PROVIDER: Erven Colla, PA-C  REFERRING DIAG:C50.112,Z17.0 (ICD-10-CM) - Malignant neoplasm of central portion of left breast in female, estrogen receptor positive (HCC)  THERAPY DIAG:  Lymphedema, not elsewhere classified  Disorder of the skin and  subcutaneous tissue related to radiation, unspecified  Abnormal posture  Rationale for Evaluation and Treatment: Rehabilitation  ONSET DATE: 07/18/2022  SUBJECTIVE:                                                                                                                                                                                           SUBJECTIVE STATEMENT: My Lt chest feels really tight today. I've been working on the exercises but I don't do as many as I should because the A/C went out in the house  and it's been too hot. I've also been doing the self MLD and that seems to help.   PERTINENT HISTORY:  Patient was diagnosed on 07/18/2022 with left grade 2 invasive ductal carcinoma breast cancer. It measures 1.9 cm and is located in the central quadrant. It is ER/PR positive and HER2 negative with a Ki67 of 10%. 09/14/22 L breast lumpectomy and SLNB 1/3. Completed radiation. Hx of chronic pain, degenerative disc disease, PTSD  PATIENT GOALS:   to decrease the pain under the arm and on the trunk  PAIN:  Are you having pain? Yes: NPRS scale: 5/10 Pain location: across Lt chest Pain description: dull, ache Aggravating factors: lying down Relieving factors: lying supine  PRECAUTIONS: at risk for lymphedema Fall, DDD  HAND DOMINANCE: right  WEIGHT BEARING RESTRICTIONS: No  FALLS:  Has patient fallen in last 6 months? No  LIVING ENVIRONMENT: Patient lives with: her daughter Lives in: House/apartment Has following equipment at home: Single point cane  OCCUPATION: On disability  LEISURE: Walks some in her yard  PRIOR LEVEL OF FUNCTION: Independent   OBJECTIVE:  COGNITION: Overall cognitive status: Within functional limits for tasks assessed    POSTURE:  Forward head and rounded shoulders posture  UPPER EXTREMITY AROM/PROM:  A/PROM RIGHT   08/22/22   Shoulder extension 40  Shoulder flexion 146  Shoulder abduction 149  Shoulder internal rotation 67   Shoulder external rotation 77    (Blank rows = not tested)  A/PROM LEFT   08/22/22 LEFT  02/20/23 LEFT 03/13/23  Shoulder extension 32 44 48  Shoulder flexion 139 155 158  Shoulder abduction 152 148 168  Shoulder internal rotation 60 52 61  Shoulder external rotation 75 88     (Blank rows = not tested)     UPPER EXTREMITY STRENGTH: WFL  LYMPHEDEMA ASSESSMENTS:   LANDMARK RIGHT   08/22/22 RIGHT 02/20/23  10 cm proximal to olecranon process 40 40  Olecranon process 27.5 27.3  10 cm proximal to ulnar styloid process 25.2 23.8  Just proximal to ulnar styloid process 15.8 16  Across hand at thumb web space 17.9 18  At base of 2nd digit 5.9 5.9  (Blank rows = not tested)  LANDMARK LEFT   08/22/22 LEFT 02/20/23  10 cm proximal to olecranon process 40.2 37  Olecranon process 26.5 27.4  10 cm proximal to ulnar styloid process 25.1 24.7  Just proximal to ulnar styloid process 16.3 15.9  Across hand at thumb web space 18.3 18.4  At base of 2nd digit 6.5 6.1  (Blank rows = not tested)  L-DEX LYMPHEDEMA SCREENING:  The patient was assessed using the L-Dex machine today to produce a lymphedema index baseline score. The patient will be reassessed on a regular basis (typically every 3 months) to obtain new L-Dex scores. If the score is > 6.5 points away from his/her baseline score indicating onset of subclinical lymphedema, it will be recommended to wear a compression garment for 4 weeks, 12 hours per day and then be reassessed. If the score continues to be > 6.5 points from baseline at reassessment, we will initiate lymphedema treatment. Assessing in this manner has a 95% rate of preventing clinically significant lymphedema.     LLIS: 33.82  QUICK DASH:   TODAY'S DATE:  03/13/23: Therapeutic Exercises Pulleys into flexion and abd x 2 mins each returning therapist demo and VC's to decrease Lt scapular compensation Roll ball up wall into flexion and abd x 10 each returning therapist  demo  Reviewed HEP per pts request: Seated EOB for AA/ROM with dowel for Lt shoulder flexion and abd x 5 each  with VC's to decrease Lt scapular compensation with each rep and then supine with dowel for flexion x 5, then Lt horz abd into abd x 5 each, and then fingers clasped behind head and abd elbows for pectoralis stretch 5x, all 5 sec holds and returning therapist demo for each Manual Therapy MLD to Lt breast in supine as follows: Short neck, 5 diaphragmatic breaths, Lt inguinal and Rt axillary nodes, Lt axillo-inguinal and anterior inter-axillary anastomosis, then focused on Lt superior breast, Rt S/L for lateral breast focus redirecting towards lateral anastomosis, then finished retracing steps in supine instructing pt while performing and having her return demo of each step using VC's and hand over hand pressure for instruction of correct light pressure.  P/ROM to Lt shoulder into flexion, abd and D2 to pts end motions with scapular depression by therapist throughout  03/06/23: Therapeutic Exercises Pulleys into flexion and abd x 2 mins each returning therapist demo and VC's to decrease Lt scapular compensation Roll ball up wall into flexion and abd x 10 each returning therapist demo Added to HEP: Seated EOB for AA/ROM with dowel for Lt shoulder flexion and abd x 10 each and then supine with dowel for flexion, then Lt horz abd into abd x 10, Lt er, and then fingers clasped behind head and abd elbows for pectoralis stretch 5c, all 5 sec holds and returning therapist demo for each Manual Therapy MLD to Lt breast in supine as follows: Short neck, 5 diaphragmatic breaths, Lt inguinal and Rt axillary nodes, Lt axillo-inguinal and anterior inter-axillary anastomosis, then focused on Lt superior breast, Rt S/L for lateral breast focus redirecting towards lateral anastomosis, then finished retracing steps in supine instructing pt while performing and having her return demo of each step using VC's and hand  over hand pressure for instruction of correct light pressure.  P/ROM to Lt shoulder into flexion, abd and D2 to pts end motions with scapular depression by therapist throughout Rt S/L for STM to medial scapular border where mild tightness palpable  02/26/23 Manual Therapy MLD to Lt breast in supine as follows: Short neck, 5 diaphragmatic breaths, Lt inguinal and Rt axillary nodes, Lt axillo-inguinal and anterior inter-axillary anastomosis, then focused on Lt superior breast, Rt S/L for lateral breast focus redirecting towards lateral anastomosis, then finished retracing steps in supine beginning to instruct pt in basics of anatomy of lymphatic system and principles of MLD.  P/ROM to Lt shoulder into flexion, abd and D2 to pts end motions with scapular depression by therapist throughout Rt S/L for STM to medial scapular border where mild tightness palpable   PATIENT EDUCATION:  Education details: AA/ROM with dowel and Self MLD for Lt breast Person educated: Patient Education method: Explanation, Demonstration, tactile and VC's and handout issued Education comprehension: Patient verbalized understanding, returned demo and will benefit from further review  HOME EXERCISE PROGRAM: Wear compression bra as much as possible Contact 2nd to [employer] to obtain another compression bra Seated and supine AA/ROM Lt UE dowel exercises and Self Lt breast MLD   ASSESSMENT:  CLINICAL IMPRESSION: Continued with AA/ROM exercises reviewing correct technique with pt. She had to spend time in restroom today reporting stomach discomfort so shortened session due to this. Then also continued with manual therapy to decrease Lt breast lymphedema and improve end Lt shoulder P/ROM.  Her end P/ROM was improved today along with her  A/ROM of Lt shoulder. Encouraged pt to work on being more compliant with her AA/ROM exercises.   Pt will benefit from skilled therapeutic intervention to improve on the following deficits:  Decreased knowledge of precautions, pain, decreased ROM, postural dysfunction, decreased knowledge of condition, increased edema   PT treatment/interventions: ADL/Self care home management, Therapeutic exercises, Therapeutic activity, Patient/Family education, Self Care, Orthotic/Fit training, Manual lymph drainage, Compression bandaging, scar mobilization, Taping, Vasopneumatic device, Manual therapy, and Re-evaluation   REHAB POTENTIAL: Good  CLINICAL DECISION MAKING: Stable/uncomplicated  EVALUATION COMPLEXITY: Low   GOALS: Goals reviewed with patient? YES  LONG TERM GOALS: (STG=LTG)    Name Target Date Goal status  1 Pt will be independent in self MLD for long term management of L breast lymphedema.    03/20/2023 NEW  2 Pt will obtain appropriate compression bra for long term management of lymphedema. 03/20/23 NEW    3 Pt will report a 75% decrease in pain in her L breast and axilla to allow improved comfort.   03/20/23 NEW  4 Pt will be independent in a home exercise program for continued stretching and strengthening.   03/20/23 NEW    PLAN:  PT FREQUENCY/DURATION: 2x/wk for 4 wks  PLAN FOR NEXT SESSION: Cont MLD to L breast and review, cont STM to L axilla/MFR and cont/review AA/ROM exs and progress HEP prn. Add supine scapular series with yellow theraband next.   Berna Spare, PTA 03/13/23 10:01 AM  Flexion (Eccentric) - Active-Assist (Cane)          Cancer Rehab [phone]    Use unaffected arm to push affected arm forward. Avoid hiking shoulder (shoulder should NOT touch cheek). Keep palm relaxed. Slowly lower affected arm. Hold stretch for _5_ seconds repeating _5-10_ times, _1-2_ times a day.  Abduction (Eccentric) - Active-Assist (Cane)    Use unaffected arm to push affected arm out to side. Avoid hiking shoulder (shoulder should NOT touch cheek). Keep palm relaxed. Slowly lower affected arm. Hold stretch _5_ seconds repeating _5-10_ times, _1-2_ times a  day.    SHOULDER: Flexion - Supine (Cane)            Hold cane in both hands. Raise arms up overhead. Do not allow back to arch. Hold _5__ seconds. Do __5-10__ times; __1-2__ times a day.   SELF ASSISTED WITH OBJECT: Shoulder Abduction / Adduction - Supine    Hold cane with both hands. Move both arms from side to side, keep elbows straight.  Hold when stretch felt for __5__ seconds. Repeat __5-10__ times; __1-2__ times a day. Once this becomes easier progress to third picture bringing affected arm towards ear by staying out to side. Same hold for _5_seconds. Repeat  _5-10_ times, _1-2_ times/day.  Shoulder Blade Stretch    Clasp fingers behind head with elbows touching in front of face. Pull elbows back while pressing shoulder blades together. Relax and hold as tolerated, can place pillow under elbow here for comfort as needed and to allow for prolonged stretch.  Repeat __5__ times. Do __1-2__ sessions per day.  SHOULDER: External Rotation - Supine (Cane)    Hold cane with both hands. Rotate arm away from body. Keep elbow on floor and next to body. _5-10__ reps per set, hold 5 seconds, _1-2__ sets per day. Add towel to keep elbow at side.  Self manual lymph drainage:    Cancer Rehab [phone] Perform this sequence once a day.  Only give enough pressure no your skin to make the skin  move.  Start with circles near neck behind collarbones 10 times each side.   Diaphragmatic - Supine   Inhale through nose making navel move out toward hands. Exhale through puckered lips, hands follow navel in. Repeat _5__ times. Rest _10__ seconds between repeats.   Copyright  VHI. All rights reserved.  Hug yourself.  Do circles at your neck just above your collarbones.  Repeat this 10 times.  Axilla - One at a Time   Using full weight of flat hand and fingers at center of uninvolved armpit, make _10__ in-place circles.   Copyright  VHI. All rights reserved.  LEG: Inguinal Nodes  Stimulation   With small finger side of hand against hip crease on involved side, gently perform circles at the crease. Repeat __10_ times.   Copyright  VHI. All rights reserved.  Axilla to Inguinal Nodes - Sweep   On involved side, pump _4__ times from armpit along side of trunk to hip crease.  Now gently stretch skin from the involved side to the uninvolved side across the chest at the shoulder line.  Repeat that 4 times.  Draw an imaginary diagonal line from upper outer breast through the nipple area toward lower inner breast.  Direct fluid upward and inward from this line toward the pathway across your upper chest .  Do this in three rows to treat all of the upper inner breast tissue, and do each row 3-4x.      Direct fluid to treat all of lower outer breast tissue downward and outward toward  pathway that is aimed at the left groin.  Finish by doing the pathways as described above going from your involved armpit to the same side groin and going across your upper chest from the involved shoulder to the uninvolved shoulder.  Repeat the steps above where you do circles in your left groin and right armpit.

## 2023-03-15 ENCOUNTER — Encounter: Payer: Self-pay | Admitting: Physical Therapy

## 2023-03-15 ENCOUNTER — Ambulatory Visit: Payer: 59 | Admitting: Physical Therapy

## 2023-03-15 DIAGNOSIS — I890 Lymphedema, not elsewhere classified: Secondary | ICD-10-CM

## 2023-03-15 DIAGNOSIS — R293 Abnormal posture: Secondary | ICD-10-CM

## 2023-03-15 DIAGNOSIS — C50112 Malignant neoplasm of central portion of left female breast: Secondary | ICD-10-CM

## 2023-03-15 DIAGNOSIS — L599 Disorder of the skin and subcutaneous tissue related to radiation, unspecified: Secondary | ICD-10-CM

## 2023-03-15 NOTE — Therapy (Signed)
OUTPATIENT PHYSICAL THERAPY BREAST CANCER TREATMENT   Patient Name: Kelli Estrada MRN: [id number] DOB:[birth year], 62 y.o., female Today's Date: 03/15/2023   PT End of Session - 03/15/23 1049     Visit Number 5    Number of Visits 9    Date for PT Re-Evaluation 03/20/23    PT Start Time 1003    PT Stop Time 1054    PT Time Calculation (min) 51 min    Activity Tolerance Patient tolerated treatment well    Behavior During Therapy [facility] for tasks assessed/performed               Past Medical History:  Diagnosis Date   Anxiety    Chronic pain    Depression    Family history of breast cancer 08/23/2022   H/O degenerative disc disease    History of radiation therapy    Left breast- 10/25/22-12/11/22- Dr. Antony Blackbird   History of radiation therapy    Left breast-10/25/22-12/11/22- Dr. Antony Blackbird   Pre-diabetes    PTSD (post-traumatic stress disorder)    Sleep apnea    Past Surgical History:  Procedure Laterality Date   ABDOMINAL HYSTERECTOMY     BREAST LUMPECTOMY WITH SENTINEL LYMPH NODE BIOPSY Left 09/14/2022   Procedure: LEFT BREAST CENTRAL LUMPECTOMY WITH SENTINEL LYMPH NODE BX;  Surgeon: Griselda Miner, MD;  Location: [facility];  Service: General;  Laterality: Left;   COLONOSCOPY WITH PROPOFOL N/A 03/15/2016   Procedure: COLONOSCOPY WITH PROPOFOL;  Surgeon: Dorena Cookey, MD;  Location: [facility];  Service: Endoscopy;  Laterality: N/A;   NECK SURGERY     Patient Active Problem List   Diagnosis Date Noted   Genetic testing 08/27/2022   Family history of breast cancer 08/23/2022   Malignant neoplasm of central portion of left breast in female, estrogen receptor positive (HCC) 08/20/2022    REFERRING PROVIDER: Erven Colla, PA-C  REFERRING DIAG:C50.112,Z17.0 (ICD-10-CM) - Malignant neoplasm of central portion of left breast in female, estrogen receptor positive (HCC)  THERAPY DIAG:  Lymphedema, not elsewhere classified  Disorder of the skin and  subcutaneous tissue related to radiation, unspecified  Abnormal posture  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 I did not want to get out of  bed.   PERTINENT HISTORY:  Patient was diagnosed on 07/18/2022 with left grade 2 invasive ductal carcinoma breast cancer. It measures 1.9 cm and is located in the central quadrant. It is ER/PR positive and HER2 negative with a Ki67 of 10%. 09/14/22 L breast lumpectomy and SLNB 1/3. Completed radiation. Hx of chronic pain, degenerative disc disease, PTSD  PATIENT GOALS:   to decrease the pain under the arm and on the trunk  PAIN:  Are you having pain? Yes: NPRS scale: 5/10 Pain location: across Lt chest Pain description: dull, ache Aggravating factors: lying down Relieving factors: lying supine  PRECAUTIONS: at risk for lymphedema Fall, DDD  HAND DOMINANCE: right  WEIGHT BEARING RESTRICTIONS: No  FALLS:  Has patient fallen in last 6 months? No  LIVING ENVIRONMENT: Patient lives with: her daughter Lives in: House/apartment Has following equipment at home: Single point cane  OCCUPATION: On disability  LEISURE: Walks some in her yard  PRIOR LEVEL OF FUNCTION: Independent   OBJECTIVE:  COGNITION: Overall cognitive status: Within functional limits for tasks assessed    POSTURE:  Forward head and rounded shoulders posture  UPPER EXTREMITY AROM/PROM:  A/PROM RIGHT   08/22/22   Shoulder extension 40  Shoulder flexion 146  Shoulder abduction 149  Shoulder internal rotation 67  Shoulder external rotation 77    (Blank rows = not tested)  A/PROM LEFT    08/22/22 LEFT  02/20/23 LEFT 03/13/23  Shoulder extension 32 44 48  Shoulder flexion 139 155 158  Shoulder abduction 152 148 168  Shoulder internal rotation 60 52 61  Shoulder external rotation 75 88     (Blank rows = not tested)     UPPER EXTREMITY STRENGTH: WFL  LYMPHEDEMA ASSESSMENTS:   LANDMARK RIGHT   08/22/22 RIGHT 02/20/23  10 cm proximal to olecranon process 40 40  Olecranon process 27.5 27.3  10 cm proximal to ulnar styloid process 25.2 23.8  Just proximal to ulnar styloid process 15.8 16  Across hand at thumb web space 17.9 18  At base of 2nd digit 5.9 5.9  (Blank rows = not tested)  LANDMARK LEFT   08/22/22 LEFT 02/20/23  10 cm proximal to olecranon process 40.2 37  Olecranon process 26.5 27.4  10 cm proximal to ulnar styloid process 25.1 24.7  Just proximal to ulnar styloid process 16.3 15.9  Across hand at thumb web space 18.3 18.4  At base of 2nd digit 6.5 6.1  (Blank rows = not tested)  L-DEX LYMPHEDEMA SCREENING:  The patient was assessed using the L-Dex machine today to produce a lymphedema index baseline score. The patient will be reassessed on a regular basis (typically every 3 months) to obtain new L-Dex scores. If the score is > 6.5 points away from his/her baseline score indicating onset of subclinical lymphedema, it will be recommended to wear a compression garment for 4 weeks, 12 hours per day and then be reassessed. If the score continues to be > 6.5 points from baseline at reassessment, we will initiate lymphedema treatment. Assessing in this manner has a 95% rate of preventing clinically significant lymphedema.     LLIS: 33.82  QUICK DASH:   TODAY'S DATE: 03/14/23: Therapeutic Exercises Pulleys into flexion and abd x 2 mins each returning therapist demo and VC's to decrease Lt scapular compensation Roll ball up wall into flexion and abd x 10 each returning therapist demo Manual Therapy MLD to Lt breast in supine as follows: Short neck, 5  diaphragmatic breaths, Lt inguinal  and Rt axillary nodes, Lt axillo-inguinal and anterior inter-axillary anastomosis, then focused on L breast redirecting towards pathways then retracing all steps instructing pt while performing and having her return demo of correct skin stretch using VC's and hand over hand pressure for instruction of correct light pressure.  P/ROM to Lt shoulder into flexion, abd and D2 to pts end motions with scapular depression by therapist throughout 03/13/23: Therapeutic Exercises Pulleys into flexion and abd x 2 mins each returning therapist demo and VC's to decrease Lt scapular compensation Roll ball up wall into flexion and abd x 10 each returning therapist demo Reviewed HEP per pts request: Seated EOB for AA/ROM with dowel for Lt shoulder flexion and abd x 5 each  with VC's to decrease Lt scapular compensation with each rep and then supine with dowel for flexion x 5, then Lt horz abd into abd x 5 each, and then fingers clasped behind head and abd elbows for pectoralis stretch 5x, all 5 sec holds and returning therapist demo for each Manual Therapy MLD to Lt breast in supine as follows: Short neck, 5 diaphragmatic breaths, Lt inguinal and Rt axillary nodes, Lt axillo-inguinal and anterior inter-axillary anastomosis, then focused on Lt superior breast, Rt S/L for lateral breast focus redirecting towards lateral anastomosis, then finished retracing steps in supine instructing pt while performing and having her return demo of each step using VC's and hand over hand pressure for instruction of correct light pressure.  P/ROM to Lt shoulder into flexion, abd and D2 to pts end motions with scapular depression by therapist throughout  03/06/23: Therapeutic Exercises Pulleys into flexion and abd x 2 mins each returning therapist demo and VC's to decrease Lt scapular compensation Roll ball up wall into flexion and abd x 10 each returning therapist demo Added to HEP: Seated EOB for AA/ROM  with dowel for Lt shoulder flexion and abd x 10 each and then supine with dowel for flexion, then Lt horz abd into abd x 10, Lt er, and then fingers clasped behind head and abd elbows for pectoralis stretch 5c, all 5 sec holds and returning therapist demo for each Manual Therapy MLD to Lt breast in supine as follows: Short neck, 5 diaphragmatic breaths, Lt inguinal and Rt axillary nodes, Lt axillo-inguinal and anterior inter-axillary anastomosis, then focused on Lt superior breast, Rt S/L for lateral breast focus redirecting towards lateral anastomosis, then finished retracing steps in supine instructing pt while performing and having her return demo of each step using VC's and hand over hand pressure for instruction of correct light pressure.  P/ROM to Lt shoulder into flexion, abd and D2 to pts end motions with scapular depression by therapist throughout Rt S/L for STM to medial scapular border where mild tightness palpable  02/26/23 Manual Therapy MLD to Lt breast in supine as follows: Short neck, 5 diaphragmatic breaths, Lt inguinal and Rt axillary nodes, Lt axillo-inguinal and anterior inter-axillary anastomosis, then focused on Lt superior breast, Rt S/L for lateral breast focus redirecting towards lateral anastomosis, then finished retracing steps in supine beginning to instruct pt in basics of anatomy of lymphatic system and principles of MLD.  P/ROM to Lt shoulder into flexion, abd and D2 to pts end motions with scapular depression by therapist throughout Rt S/L for STM to medial scapular border where mild tightness palpable   PATIENT EDUCATION:  Education details: AA/ROM with dowel and Self MLD for Lt breast Person educated: Patient Education method: Explanation, Demonstration, tactile and VC's and handout issued Education  comprehension: Patient verbalized understanding, returned demo and will benefit from further review  HOME EXERCISE PROGRAM: Wear compression bra as much as  possible Contact 2nd to Nature to obtain another compression bra Seated and supine AA/ROM Lt UE dowel exercises and Self Lt breast MLD   ASSESSMENT:  CLINICAL IMPRESSION: Continued with AAROM. Pt did not have much tightness with PROM so only did this briefly. Focused session on MLD to L breast and instructing pt in correct skin stretching technique and having her return demonstrate. She was sliding over her skin and was not getting a proper skin stretch.   Pt will benefit from skilled therapeutic intervention to improve on the following deficits: Decreased knowledge of precautions, pain, decreased ROM, postural dysfunction, decreased knowledge of condition, increased edema   PT treatment/interventions: ADL/Self care home management, Therapeutic exercises, Therapeutic activity, Patient/Family education, Self Care, Orthotic/Fit training, Manual lymph drainage, Compression bandaging, scar mobilization, Taping, Vasopneumatic device, Manual therapy, and Re-evaluation   REHAB POTENTIAL: Good  CLINICAL DECISION MAKING: Stable/uncomplicated  EVALUATION COMPLEXITY: Low   GOALS: Goals reviewed with patient? YES  LONG TERM GOALS: (STG=LTG)    Name Target Date Goal status  1 Pt will be independent in self MLD for long term management of L breast lymphedema.    03/20/2023 NEW  2 Pt will obtain appropriate compression bra for long term management of lymphedema. 03/20/23 NEW    3 Pt will report a 75% decrease in pain in her L breast and axilla to allow improved comfort.   03/20/23 NEW  4 Pt will be independent in a home exercise program for continued stretching and strengthening.   03/20/23 NEW    PLAN:  PT FREQUENCY/DURATION: 2x/wk for 4 wks  PLAN FOR NEXT SESSION: Cont MLD to L breast and review, cont STM to L axilla/MFR and cont/review AA/ROM exs and progress HEP prn. Add supine scapular series with yellow theraband next.   [facility], PT 03/15/23 10:55 AM   Flexion  (Eccentric) - Active-Assist (Cane)          Cancer Rehab [phone]    Use unaffected arm to push affected arm forward. Avoid hiking shoulder (shoulder should NOT touch cheek). Keep palm relaxed. Slowly lower affected arm. Hold stretch for _5_ seconds repeating _5-10_ times, _1-2_ times a day.  Abduction (Eccentric) - Active-Assist (Cane)    Use unaffected arm to push affected arm out to side. Avoid hiking shoulder (shoulder should NOT touch cheek). Keep palm relaxed. Slowly lower affected arm. Hold stretch _5_ seconds repeating _5-10_ times, _1-2_ times a day.    SHOULDER: Flexion - Supine (Cane)            Hold cane in both hands. Raise arms up overhead. Do not allow back to arch. Hold _5__ seconds. Do __5-10__ times; __1-2__ times a day.   SELF ASSISTED WITH OBJECT: Shoulder Abduction / Adduction - Supine    Hold cane with both hands. Move both arms from side to side, keep elbows straight.  Hold when stretch felt for __5__ seconds. Repeat __5-10__ times; __1-2__ times a day. Once this becomes easier progress to third picture bringing affected arm towards ear by staying out to side. Same hold for _5_seconds. Repeat  _5-10_ times, _1-2_ times/day.  Shoulder Blade Stretch    Clasp fingers behind head with elbows touching in front of face. Pull elbows back while pressing shoulder blades together. Relax and hold as tolerated, can place pillow under elbow here for comfort as needed and to allow  for prolonged stretch.  Repeat __5__ times. Do __1-2__ sessions per day.  SHOULDER: External Rotation - Supine (Cane)    Hold cane with both hands. Rotate arm away from body. Keep elbow on floor and next to body. _5-10__ reps per set, hold 5 seconds, _1-2__ sets per day. Add towel to keep elbow at side.  Self manual lymph drainage:    Cancer Rehab [phone] Perform this sequence once a day.  Only give enough pressure no your skin to make the skin move.  Start with circles near neck  behind collarbones 10 times each side.   Diaphragmatic - Supine   Inhale through nose making navel move out toward hands. Exhale through puckered lips, hands follow navel in. Repeat _5__ times. Rest _10__ seconds between repeats.   Copyright  VHI. All rights reserved.  Hug yourself.  Do circles at your neck just above your collarbones.  Repeat this 10 times.  Axilla - One at a Time   Using full weight of flat hand and fingers at center of uninvolved armpit, make _10__ in-place circles.   Copyright  VHI. All rights reserved.  LEG: Inguinal Nodes Stimulation   With small finger side of hand against hip crease on involved side, gently perform circles at the crease. Repeat __10_ times.   Copyright  VHI. All rights reserved.  Axilla to Inguinal Nodes - Sweep   On involved side, pump _4__ times from armpit along side of trunk to hip crease.  Now gently stretch skin from the involved side to the uninvolved side across the chest at the shoulder line.  Repeat that 4 times.  Draw an imaginary diagonal line from upper outer breast through the nipple area toward lower inner breast.  Direct fluid upward and inward from this line toward the pathway across your upper chest .  Do this in three rows to treat all of the upper inner breast tissue, and do each row 3-4x.      Direct fluid to treat all of lower outer breast tissue downward and outward toward  pathway that is aimed at the left groin.  Finish by doing the pathways as described above going from your involved armpit to the same side groin and going across your upper chest from the involved shoulder to the uninvolved shoulder.  Repeat the steps above where you do circles in your left groin and right armpit.

## 2023-03-18 ENCOUNTER — Ambulatory Visit: Payer: 59 | Attending: Radiology

## 2023-03-18 DIAGNOSIS — R293 Abnormal posture: Secondary | ICD-10-CM | POA: Insufficient documentation

## 2023-03-18 DIAGNOSIS — Z170 Estrogen receptor positive status [ER+]: Secondary | ICD-10-CM | POA: Insufficient documentation

## 2023-03-18 DIAGNOSIS — C50112 Malignant neoplasm of central portion of left female breast: Secondary | ICD-10-CM | POA: Diagnosis present

## 2023-03-18 DIAGNOSIS — I890 Lymphedema, not elsewhere classified: Secondary | ICD-10-CM | POA: Insufficient documentation

## 2023-03-18 DIAGNOSIS — L599 Disorder of the skin and subcutaneous tissue related to radiation, unspecified: Secondary | ICD-10-CM | POA: Insufficient documentation

## 2023-03-18 NOTE — Therapy (Signed)
OUTPATIENT PHYSICAL THERAPY BREAST CANCER TREATMENT   Patient Name: Kelli Estrada MRN: [id number] DOB:[birth year], 62 y.o., female Today's Date: 03/18/2023   PT End of Session - 03/18/23 1109     Visit Number 6    Number of Visits 9    Date for PT Re-Evaluation 03/20/23    PT Start Time 1107    PT Stop Time 1202    PT Time Calculation (min) 55 min    Activity Tolerance Patient tolerated treatment well    Behavior During Therapy [facility] for tasks assessed/performed               Past Medical History:  Diagnosis Date   Anxiety    Chronic pain    Depression    Family history of breast cancer 08/23/2022   H/O degenerative disc disease    History of radiation therapy    Left breast- 10/25/22-12/11/22- Dr. Antony Blackbird   History of radiation therapy    Left breast-10/25/22-12/11/22- Dr. Antony Blackbird   Pre-diabetes    PTSD (post-traumatic stress disorder)    Sleep apnea    Past Surgical History:  Procedure Laterality Date   ABDOMINAL HYSTERECTOMY     BREAST LUMPECTOMY WITH SENTINEL LYMPH NODE BIOPSY Left 09/14/2022   Procedure: LEFT BREAST CENTRAL LUMPECTOMY WITH SENTINEL LYMPH NODE BX;  Surgeon: Griselda Miner, MD;  Location: [facility];  Service: General;  Laterality: Left;   COLONOSCOPY WITH PROPOFOL N/A 03/15/2016   Procedure: COLONOSCOPY WITH PROPOFOL;  Surgeon: Dorena Cookey, MD;  Location: [facility];  Service: Endoscopy;  Laterality: N/A;   NECK SURGERY     Patient Active Problem List   Diagnosis Date Noted   Genetic testing 08/27/2022   Family history of breast cancer 08/23/2022   Malignant neoplasm of central portion of left breast in female, estrogen receptor positive (HCC) 08/20/2022    REFERRING PROVIDER: Erven Colla, PA-C  REFERRING DIAG:C50.112,Z17.0 (ICD-10-CM) - Malignant neoplasm of central portion of left breast in female, estrogen receptor positive (HCC)  THERAPY DIAG:  Lymphedema, not elsewhere classified  Disorder of the skin and  subcutaneous tissue related to radiation, unspecified  Abnormal posture  Carcinoma of central portion of left breast in female, estrogen receptor positive (HCC)  Rationale for Evaluation and Treatment: Rehabilitation  ONSET DATE: 07/18/2022  SUBJECTIVE:                                                                                                                                                                                           SUBJECTIVE STATEMENT: I think I slept too much because my Lt side is hurting all over today.  The sharp pains I've been having in my Lt breast since I started coming are still there but have gotten a little better by about 10-20%.  PERTINENT HISTORY:  Patient was diagnosed on 07/18/2022 with left grade 2 invasive ductal carcinoma breast cancer. It measures 1.9 cm and is located in the central quadrant. It is ER/PR positive and HER2 negative with a Ki67 of 10%. 09/14/22 L breast lumpectomy and SLNB 1/3. Completed radiation. Hx of chronic pain, degenerative disc disease, PTSD  PATIENT GOALS:   to decrease the pain under the arm and on the trunk  PAIN:  Are you having pain? Yes: NPRS scale: 9-10/10 Pain location: Lt side of the body Pain description: dull, ache Aggravating factors: lying down Relieving factors: lying supine  PRECAUTIONS: at risk for lymphedema Fall, DDD  HAND DOMINANCE: right  WEIGHT BEARING RESTRICTIONS: No  FALLS:  Has patient fallen in last 6 months? No  LIVING ENVIRONMENT: Patient lives with: her daughter Lives in: House/apartment Has following equipment at home: Single point cane  OCCUPATION: On disability  LEISURE: Walks some in her yard  PRIOR LEVEL OF FUNCTION: Independent   OBJECTIVE:  COGNITION: Overall cognitive status: Within functional limits for tasks assessed    POSTURE:  Forward head and rounded shoulders posture  UPPER EXTREMITY AROM/PROM:  A/PROM RIGHT   08/22/22   Shoulder extension 40  Shoulder  flexion 146  Shoulder abduction 149  Shoulder internal rotation 67  Shoulder external rotation 77    (Blank rows = not tested)  A/PROM LEFT   08/22/22 LEFT  02/20/23 LEFT 03/13/23  Shoulder extension 32 44 48  Shoulder flexion 139 155 158  Shoulder abduction 152 148 168  Shoulder internal rotation 60 52 61  Shoulder external rotation 75 88     (Blank rows = not tested)     UPPER EXTREMITY STRENGTH: WFL  LYMPHEDEMA ASSESSMENTS:   LANDMARK RIGHT   08/22/22 RIGHT 02/20/23  10 cm proximal to olecranon process 40 40  Olecranon process 27.5 27.3  10 cm proximal to ulnar styloid process 25.2 23.8  Just proximal to ulnar styloid process 15.8 16  Across hand at thumb web space 17.9 18  At base of 2nd digit 5.9 5.9  (Blank rows = not tested)  LANDMARK LEFT   08/22/22 LEFT 02/20/23  10 cm proximal to olecranon process 40.2 37  Olecranon process 26.5 27.4  10 cm proximal to ulnar styloid process 25.1 24.7  Just proximal to ulnar styloid process 16.3 15.9  Across hand at thumb web space 18.3 18.4  At base of 2nd digit 6.5 6.1  (Blank rows = not tested)  L-DEX LYMPHEDEMA SCREENING:  The patient was assessed using the L-Dex machine today to produce a lymphedema index baseline score. The patient will be reassessed on a regular basis (typically every 3 months) to obtain new L-Dex scores. If the score is > 6.5 points away from his/her baseline score indicating onset of subclinical lymphedema, it will be recommended to wear a compression garment for 4 weeks, 12 hours per day and then be reassessed. If the score continues to be > 6.5 points from baseline at reassessment, we will initiate lymphedema treatment. Assessing in this manner has a 95% rate of preventing clinically significant lymphedema.     LLIS: 33.82  QUICK DASH:   TODAY'S DATE: 03/18/23: Therapeutic Exercises Pulleys into flexion and abd x 2 mins each returning therapist demo and VC's to decrease Lt scapular compensation Roll  ball up wall into  flexion and Lt abd x 10 each returning therapist demo Supine Scapular Series with yellow theraband returning therapist demo x 10 each Manual Therapy MLD to Lt breast in supine as follows: Short neck, 5 diaphragmatic breaths, Lt inguinal and Rt axillary nodes, Lt axillo-inguinal and anterior inter-axillary anastomosis, then focused on Lt breast redirecting towards pathways then retracing all steps.  P/ROM to Lt shoulder into flexion, abd and D2 to pts end motions with scapular depression by therapist throughout, pt with full P/ROM by end of session  03/14/23: Therapeutic Exercises Pulleys into flexion and abd x 2 mins each returning therapist demo and VC's to decrease Lt scapular compensation Roll ball up wall into flexion and abd x 10 each returning therapist demo Manual Therapy MLD to Lt breast in supine as follows: Short neck, 5 diaphragmatic breaths, Lt inguinal and Rt axillary nodes, Lt axillo-inguinal and anterior inter-axillary anastomosis, then focused on L breast redirecting towards pathways then retracing all steps instructing pt while performing and having her return demo of correct skin stretch using VC's and hand over hand pressure for instruction of correct light pressure.  P/ROM to Lt shoulder into flexion, abd and D2 to pts end motions with scapular depression by therapist throughout  03/13/23: Therapeutic Exercises Pulleys into flexion and abd x 2 mins each returning therapist demo and VC's to decrease Lt scapular compensation Roll ball up wall into flexion and abd x 10 each returning therapist demo Reviewed HEP per pts request: Seated EOB for AA/ROM with dowel for Lt shoulder flexion and abd x 5 each  with VC's to decrease Lt scapular compensation with each rep and then supine with dowel for flexion x 5, then Lt horz abd into abd x 5 each, and then fingers clasped behind head and abd elbows for pectoralis stretch 5x, all 5 sec holds and returning therapist demo for  each Manual Therapy MLD to Lt breast in supine as follows: Short neck, 5 diaphragmatic breaths, Lt inguinal and Rt axillary nodes, Lt axillo-inguinal and anterior inter-axillary anastomosis, then focused on Lt superior breast, Rt S/L for lateral breast focus redirecting towards lateral anastomosis, then finished retracing steps in supine instructing pt while performing and having her return demo of each step using VC's and hand over hand pressure for instruction of correct light pressure.  P/ROM to Lt shoulder into flexion, abd and D2 to pts end motions with scapular depression by therapist throughout    PATIENT EDUCATION:  Education details: Supine scapular series with yellow theraband Person educated: Patient Education method: Explanation, Demonstration, tactile and VC's and handout issued Education comprehension: Patient verbalized understanding, returned demo and will benefit from further review  HOME EXERCISE PROGRAM: Wear compression bra as much as possible Contact 2nd to Nature to obtain another compression bra Seated and supine AA/ROM Lt UE dowel exercises and Self Lt breast MLD Supine scapular series   ASSESSMENT:  CLINICAL IMPRESSION: Progressed HEP to include supine scapular series with yellow theraband. She was able to return correct demo and handout issued for this. Pts P/ROM is improving and her breast lymphedema is becoming softer as well. Progress noted.   Pt will benefit from skilled therapeutic intervention to improve on the following deficits: Decreased knowledge of precautions, pain, decreased ROM, postural dysfunction, decreased knowledge of condition, increased edema   PT treatment/interventions: ADL/Self care home management, Therapeutic exercises, Therapeutic activity, Patient/Family education, Self Care, Orthotic/Fit training, Manual lymph drainage, Compression bandaging, scar mobilization, Taping, Vasopneumatic device, Manual therapy, and Re-evaluation   REHAB  POTENTIAL: Good  CLINICAL DECISION MAKING: Stable/uncomplicated  EVALUATION COMPLEXITY: Low   GOALS: Goals reviewed with patient? YES  LONG TERM GOALS: (STG=LTG)    Name Target Date Goal status  1 Pt will be independent in self MLD for long term management of L breast lymphedema.    03/20/2023 NEW  2 Pt will obtain appropriate compression bra for long term management of lymphedema. 03/20/23 NEW    3 Pt will report a 75% decrease in pain in her L breast and axilla to allow improved comfort.   03/20/23 NEW  4 Pt will be independent in a home exercise program for continued stretching and strengthening.   03/20/23 NEW    PLAN:  PT FREQUENCY/DURATION: 2x/wk for 4 wks  PLAN FOR NEXT SESSION: Update goals and renewal next. Review supine scapular series; Cont MLD to L breast and review, cont STM to L axilla/MFR and cont/review AA/ROM exs and progress HEP prn.    Over Head Pull: Narrow and Wide Grip   Cancer Rehab [phone]   On back, knees bent, feet flat, band across thighs, elbows straight but relaxed. Pull hands apart (start). Keeping elbows straight, bring arms up and over head, hands toward floor. Keep pull steady on band. Hold momentarily. Return slowly, keeping pull steady, back to start. Then do same with a wider grip on the band (past shoulder width) Repeat _5-10__ times. Band color __yellow____   Side Pull: Double Arm   On back, knees bent, feet flat. Arms perpendicular to body, shoulder level, elbows straight but relaxed. Pull arms out to sides, elbows straight. Resistance band comes across collarbones, hands toward floor. Hold momentarily. Slowly return to starting position. Repeat _5-10__ times. Band color _yellow____   Sword   On back, knees bent, feet flat, left hand on left hip, right hand above left. Pull right arm DIAGONALLY (hip to shoulder) across chest. Bring right arm along head toward floor. Hold momentarily. Slowly return to starting position. Repeat _5-10__  times. Do with left arm. Band color _yellow_____   Shoulder Rotation: Double Arm   On back, knees bent, feet flat, elbows tucked at sides, bent 90, hands palms up. Pull hands apart and down toward floor, keeping elbows near sides. Hold momentarily. Slowly return to starting position. Repeat _5-10__ times. Band color __yellow____

## 2023-03-18 NOTE — Patient Instructions (Addendum)
Over Head Pull: Narrow and Wide Grip   Cancer Rehab [phone]   On back, knees bent, feet flat, band across thighs, elbows straight but relaxed. Pull hands apart (start). Keeping elbows straight, bring arms up and over head, hands toward floor. Keep pull steady on band. Hold momentarily. Return slowly, keeping pull steady, back to start. Then do same with a wider grip on the band (past shoulder width) Repeat _5-10__ times. Band color __yellow____   Side Pull: Double Arm   On back, knees bent, feet flat. Arms perpendicular to body, shoulder level, elbows straight but relaxed. Pull arms out to sides, elbows straight. Resistance band comes across collarbones, hands toward floor. Hold momentarily. Slowly return to starting position. Repeat _5-10__ times. Band color _yellow____   Sword   On back, knees bent, feet flat, left hand on left hip, right hand above left. Pull right arm DIAGONALLY (hip to shoulder) across chest. Bring right arm along head toward floor. Hold momentarily. Slowly return to starting position. Repeat _5-10__ times. Do with left arm. Band color _yellow_____   Shoulder Rotation: Double Arm   On back, knees bent, feet flat, elbows tucked at sides, bent 90, hands palms up. Pull hands apart and down toward floor, keeping elbows near sides. Hold momentarily. Slowly return to starting position. Repeat _5-10__ times. Band color __yellow____    

## 2023-03-20 ENCOUNTER — Ambulatory Visit: Payer: 59 | Admitting: Physical Therapy

## 2023-03-20 ENCOUNTER — Encounter: Payer: Self-pay | Admitting: Physical Therapy

## 2023-03-20 DIAGNOSIS — I890 Lymphedema, not elsewhere classified: Secondary | ICD-10-CM | POA: Diagnosis not present

## 2023-03-20 DIAGNOSIS — L599 Disorder of the skin and subcutaneous tissue related to radiation, unspecified: Secondary | ICD-10-CM

## 2023-03-20 DIAGNOSIS — R293 Abnormal posture: Secondary | ICD-10-CM

## 2023-03-20 DIAGNOSIS — Z170 Estrogen receptor positive status [ER+]: Secondary | ICD-10-CM

## 2023-03-20 NOTE — Therapy (Signed)
OUTPATIENT PHYSICAL THERAPY BREAST CANCER TREATMENT   Patient Name: Kelli Estrada MRN: [id number] DOB:18-Feb-1961, 62 y.o., female Today's Date: 03/20/2023   PT End of Session - 03/20/23 1124     Visit Number 7    Number of Visits 9    PT Start Time 1105    PT Stop Time 1153    PT Time Calculation (min) 48 min    Activity Tolerance Patient tolerated treatment well    Behavior During Therapy [facility] for tasks assessed/performed               Past Medical History:  Diagnosis Date   Anxiety    Chronic pain    Depression    Family history of breast cancer 08/23/2022   H/O degenerative disc disease    History of radiation therapy    Left breast- 10/25/22-12/11/22- Dr. Antony Blackbird   History of radiation therapy    Left breast-10/25/22-12/11/22- Dr. Antony Blackbird   Pre-diabetes    PTSD (post-traumatic stress disorder)    Sleep apnea    Past Surgical History:  Procedure Laterality Date   ABDOMINAL HYSTERECTOMY     BREAST LUMPECTOMY WITH SENTINEL LYMPH NODE BIOPSY Left 09/14/2022   Procedure: LEFT BREAST CENTRAL LUMPECTOMY WITH SENTINEL LYMPH NODE BX;  Surgeon: Griselda Miner, MD;  Location: [facility];  Service: General;  Laterality: Left;   COLONOSCOPY WITH PROPOFOL N/A 03/15/2016   Procedure: COLONOSCOPY WITH PROPOFOL;  Surgeon: Dorena Cookey, MD;  Location: [facility];  Service: Endoscopy;  Laterality: N/A;   NECK SURGERY     Patient Active Problem List   Diagnosis Date Noted   Genetic testing 08/27/2022   Family history of breast cancer 08/23/2022   Malignant neoplasm of central portion of left breast in female, estrogen receptor positive (HCC) 08/20/2022    REFERRING PROVIDER: Erven Colla, PA-C  REFERRING DIAG:C50.112,Z17.0 (ICD-10-CM) - Malignant neoplasm of central portion of left breast in female, estrogen receptor positive (HCC)  THERAPY DIAG:  Lymphedema, not elsewhere classified  Disorder of the skin and subcutaneous tissue related to radiation,  unspecified  Abnormal posture  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 My R side has been feeling tight since I started the exercises.  PERTINENT HISTORY:  Patient was diagnosed on 07/18/2022 with left grade 2 invasive ductal carcinoma breast cancer. It measures 1.9 cm and is located in the central quadrant. It is ER/PR positive and HER2 negative with a Ki67 of 10%. 09/14/22 L breast lumpectomy and SLNB 1/3. Completed radiation. Hx of chronic pain, degenerative disc disease, PTSD  PATIENT GOALS:   to decrease the pain under the arm and on the trunk  PAIN:  Are you having pain? Yes: NPRS scale: 5/10 Pain location: Lt side near incision, L shoulder Pain description: dull, ache Aggravating factors: lying on that side Relieving factors: lying supine  PRECAUTIONS: at risk for lymphedema Fall, DDD  HAND DOMINANCE: right  WEIGHT BEARING RESTRICTIONS: No  FALLS:  Has patient fallen in last 6 months? No  LIVING ENVIRONMENT: Patient lives with: her daughter Lives in: House/apartment Has following equipment at home: Single point cane  OCCUPATION: On disability  LEISURE: Walks some in her yard  PRIOR LEVEL OF FUNCTION: Independent   OBJECTIVE:  COGNITION: Overall cognitive status: Within functional limits for tasks assessed    POSTURE:  Forward head and rounded shoulders posture  UPPER EXTREMITY AROM/PROM:  A/PROM RIGHT   08/22/22   Shoulder extension 40  Shoulder flexion 146  Shoulder abduction 149  Shoulder internal rotation 67  Shoulder external rotation 77    (Blank rows = not  tested)  A/PROM LEFT   08/22/22 LEFT  02/20/23 LEFT 03/13/23  Shoulder extension 32 44 48  Shoulder flexion 139 155 158  Shoulder abduction 152 148 168  Shoulder internal rotation 60 52 61  Shoulder external rotation 75 88     (Blank rows = not tested)     UPPER EXTREMITY STRENGTH: WFL  LYMPHEDEMA ASSESSMENTS:   LANDMARK RIGHT   08/22/22 RIGHT 02/20/23  10 cm proximal to olecranon process 40 40  Olecranon process 27.5 27.3  10 cm proximal to ulnar styloid process 25.2 23.8  Just proximal to ulnar styloid process 15.8 16  Across hand at thumb web space 17.9 18  At base of 2nd digit 5.9 5.9  (Blank rows = not tested)  LANDMARK LEFT   08/22/22 LEFT 02/20/23  10 cm proximal to olecranon process 40.2 37  Olecranon process 26.5 27.4  10 cm proximal to ulnar styloid process 25.1 24.7  Just proximal to ulnar styloid process 16.3 15.9  Across hand at thumb web space 18.3 18.4  At base of 2nd digit 6.5 6.1  (Blank rows = not tested)  L-DEX LYMPHEDEMA SCREENING:  The patient was assessed using the L-Dex machine today to produce a lymphedema index baseline score. The patient will be reassessed on a regular basis (typically every 3 months) to obtain new L-Dex scores. If the score is > 6.5 points away from his/her baseline score indicating onset of subclinical lymphedema, it will be recommended to wear a compression garment for 4 weeks, 12 hours per day and then be reassessed. If the score continues to be > 6.5 points from baseline at reassessment, we will initiate lymphedema treatment. Assessing in this manner has a 95% rate of preventing clinically significant lymphedema.     LLIS: 33.82  QUICK DASH:   TODAY'S DATE: 03/20/23: Therapeutic Exercises Pulleys into flexion and abd x 2 mins each returning therapist demo and VC's to decrease Lt scapular compensation Roll ball up wall into flexion and Lt abd x 10 each returning therapist demo Supine Scapular Series with yellow theraband  returning therapist demo x 10 each: narrow and  wide grip flexion, ER, diagonals, horizontal abduction Manual Therapy MLD to Lt breast in supine as follows: Short neck, 5 diaphragmatic breaths, Lt inguinal and Rt axillary nodes, Lt axillo-inguinal and anterior inter-axillary anastomosis, then focused on Lt breast redirecting towards pathways then retracing all steps.   03/18/23: Therapeutic Exercises Pulleys into flexion and abd x 2 mins each returning therapist demo and VC's to decrease Lt scapular compensation Roll ball up wall into flexion and Lt abd x 10 each returning therapist demo Supine Scapular Series with yellow theraband returning therapist demo x 10 each Manual Therapy MLD to Lt breast in supine as follows: Short neck, 5 diaphragmatic breaths, Lt inguinal and Rt axillary nodes, Lt axillo-inguinal and anterior inter-axillary anastomosis, then focused on Lt breast redirecting towards pathways then retracing all steps.  P/ROM to Lt shoulder into flexion, abd and D2 to pts end motions with scapular depression by therapist throughout, pt with full P/ROM by end of session  03/14/23: Therapeutic Exercises Pulleys into flexion and abd x 2 mins each returning therapist demo and VC's to decrease Lt scapular compensation Roll ball up wall into flexion and abd x 10 each returning therapist demo Manual Therapy MLD to Lt breast in supine as follows: Short neck, 5 diaphragmatic breaths, Lt inguinal and Rt axillary nodes, Lt axillo-inguinal and anterior inter-axillary anastomosis, then focused on L breast redirecting towards pathways then retracing all steps instructing pt while performing and having her return demo of correct skin stretch using VC's and hand over hand pressure for instruction of correct light pressure.  P/ROM to Lt shoulder into flexion, abd and D2 to pts end motions with scapular depression by therapist throughout  03/13/23: Therapeutic Exercises Pulleys into flexion and abd x 2 mins  each returning therapist demo and VC's to decrease Lt scapular compensation Roll ball up wall into flexion and abd x 10 each returning therapist demo Reviewed HEP per pts request: Seated EOB for AA/ROM with dowel for Lt shoulder flexion and abd x 5 each  with VC's to decrease Lt scapular compensation with each rep and then supine with dowel for flexion x 5, then Lt horz abd into abd x 5 each, and then fingers clasped behind head and abd elbows for pectoralis stretch 5x, all 5 sec holds and returning therapist demo for each Manual Therapy MLD to Lt breast in supine as follows: Short neck, 5 diaphragmatic breaths, Lt inguinal and Rt axillary nodes, Lt axillo-inguinal and anterior inter-axillary anastomosis, then focused on Lt superior breast, Rt S/L for lateral breast focus redirecting towards lateral anastomosis, then finished retracing steps in supine instructing pt while performing and having her return demo of each step using VC's and hand over hand pressure for instruction of correct light pressure.  P/ROM to Lt shoulder into flexion, abd and D2 to pts end motions with scapular depression by therapist throughout    PATIENT EDUCATION:  Education details: Supine scapular series with yellow theraband Person educated: Patient Education method: Explanation, Demonstration, tactile and VC's and handout issued Education comprehension: Patient verbalized understanding, returned demo and will benefit from further review  HOME EXERCISE PROGRAM: Wear compression bra as much as possible Contact 2nd to Nature to obtain another compression bra Seated and supine AA/ROM Lt UE dowel exercises and Self Lt breast MLD Supine scapular series   ASSESSMENT:  CLINICAL IMPRESSION: Pt is demonstrating increasing independence with supine scapular strengthening exercises. Her breast fibrosis is improving since evaluation. Pt is having less pain today. Her ROM is also continuing  to improve. Will assess pt's progress  towards goals and next session and will decide wether to update POC for d/c.   Pt will benefit from skilled therapeutic intervention to improve on the following deficits: Decreased knowledge of precautions, pain, decreased ROM, postural dysfunction, decreased knowledge of condition, increased edema   PT treatment/interventions: ADL/Self care home management, Therapeutic exercises, Therapeutic activity, Patient/Family education, Self Care, Orthotic/Fit training, Manual lymph drainage, Compression bandaging, scar mobilization, Taping, Vasopneumatic device, Manual therapy, and Re-evaluation   REHAB POTENTIAL: Good  CLINICAL DECISION MAKING: Stable/uncomplicated  EVALUATION COMPLEXITY: Low   GOALS: Goals reviewed with patient? YES  LONG TERM GOALS: (STG=LTG)    Name Target Date Goal status  1 Pt will be independent in self MLD for long term management of L breast lymphedema.    03/20/2023 NEW  2 Pt will obtain appropriate compression bra for long term management of lymphedema. 03/20/23 NEW    3 Pt will report a 75% decrease in pain in her L breast and axilla to allow improved comfort.   03/20/23 NEW  4 Pt will be independent in a home exercise program for continued stretching and strengthening.   03/20/23 NEW    PLAN:  PT FREQUENCY/DURATION: 2x/wk for 4 wks  PLAN FOR NEXT SESSION: Update goals and renewal next. Review supine scapular series; Cont MLD to L breast and review, cont STM to L axilla/MFR and cont/review AA/ROM exs and progress HEP prn.    Over Head Pull: Narrow and Wide Grip   Cancer Rehab [phone]   On back, knees bent, feet flat, band across thighs, elbows straight but relaxed. Pull hands apart (start). Keeping elbows straight, bring arms up and over head, hands toward floor. Keep pull steady on band. Hold momentarily. Return slowly, keeping pull steady, back to start. Then do same with a wider grip on the band (past shoulder width) Repeat _5-10__ times. Band color  __yellow____   Side Pull: Double Arm   On back, knees bent, feet flat. Arms perpendicular to body, shoulder level, elbows straight but relaxed. Pull arms out to sides, elbows straight. Resistance band comes across collarbones, hands toward floor. Hold momentarily. Slowly return to starting position. Repeat _5-10__ times. Band color _yellow____   Sword   On back, knees bent, feet flat, left hand on left hip, right hand above left. Pull right arm DIAGONALLY (hip to shoulder) across chest. Bring right arm along head toward floor. Hold momentarily. Slowly return to starting position. Repeat _5-10__ times. Do with left arm. Band color _yellow_____   Shoulder Rotation: Double Arm   On back, knees bent, feet flat, elbows tucked at sides, bent 90, hands palms up. Pull hands apart and down toward floor, keeping elbows near sides. Hold momentarily. Slowly return to starting position. Repeat _5-10__ times. Band color __yellow____

## 2023-03-25 ENCOUNTER — Encounter: Payer: Self-pay | Admitting: Rehabilitation

## 2023-03-25 ENCOUNTER — Ambulatory Visit: Payer: 59 | Admitting: Rehabilitation

## 2023-03-25 DIAGNOSIS — I890 Lymphedema, not elsewhere classified: Secondary | ICD-10-CM | POA: Diagnosis not present

## 2023-03-25 DIAGNOSIS — L599 Disorder of the skin and subcutaneous tissue related to radiation, unspecified: Secondary | ICD-10-CM

## 2023-03-25 DIAGNOSIS — C50112 Malignant neoplasm of central portion of left female breast: Secondary | ICD-10-CM

## 2023-03-25 DIAGNOSIS — R293 Abnormal posture: Secondary | ICD-10-CM

## 2023-03-25 NOTE — Therapy (Signed)
OUTPATIENT PHYSICAL THERAPY BREAST CANCER TREATMENT   Patient Name: Kelli Estrada MRN: [id number] DOB:19-Feb-1961, 61 y.o., female Today's Date: 03/25/2023   PT End of Session - 03/25/23 1045     Visit Number 8    Number of Visits 16    Date for PT Re-Evaluation 04/22/23    PT Start Time 1055    PT Stop Time 1150    PT Time Calculation (min) 55 min    Activity Tolerance Patient tolerated treatment well    Behavior During Therapy [facility] for tasks assessed/performed                Past Medical History:  Diagnosis Date   Anxiety    Chronic pain    Depression    Family history of breast cancer 08/23/2022   H/O degenerative disc disease    History of radiation therapy    Left breast- 10/25/22-12/11/22- Dr. Antony Blackbird   History of radiation therapy    Left breast-10/25/22-12/11/22- Dr. Antony Blackbird   Pre-diabetes    PTSD (post-traumatic stress disorder)    Sleep apnea    Past Surgical History:  Procedure Laterality Date   ABDOMINAL HYSTERECTOMY     BREAST LUMPECTOMY WITH SENTINEL LYMPH NODE BIOPSY Left 09/14/2022   Procedure: LEFT BREAST CENTRAL LUMPECTOMY WITH SENTINEL LYMPH NODE BX;  Surgeon: Griselda Miner, MD;  Location: [facility];  Service: General;  Laterality: Left;   COLONOSCOPY WITH PROPOFOL N/A 03/15/2016   Procedure: COLONOSCOPY WITH PROPOFOL;  Surgeon: Dorena Cookey, MD;  Location: [facility];  Service: Endoscopy;  Laterality: N/A;   NECK SURGERY     Patient Active Problem List   Diagnosis Date Noted   Genetic testing 08/27/2022   Family history of breast cancer 08/23/2022   Malignant neoplasm of central portion of left breast in female, estrogen receptor positive (HCC) 08/20/2022    REFERRING PROVIDER: Erven Colla, PA-C  REFERRING DIAG:C50.112,Z17.0 (ICD-10-CM) - Malignant neoplasm of central portion of left breast in female, estrogen receptor positive (HCC)  THERAPY DIAG:  Lymphedema, not elsewhere classified  Disorder of the skin and  subcutaneous tissue related to radiation, unspecified  Abnormal posture  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 It  feels like a lot of pressure.  I don't feel like I am done.  I also feel like it gets worse as I dont have any air conditioning right now.    PERTINENT HISTORY:  Patient was diagnosed on 07/18/2022 with left grade 2 invasive ductal carcinoma breast cancer. It measures 1.9 cm and is located in the central quadrant. It is ER/PR positive and HER2 negative with a Ki67 of 10%. 09/14/22 L breast lumpectomy and SLNB 1/3. Completed radiation. Hx of chronic pain, degenerative disc disease, PTSD  PATIENT GOALS:   to decrease the pain under the arm and on the trunk  PAIN:  Are you having pain? Yes: NPRS scale: 5/10 Pain location: Lt side near incision, L shoulder Pain description: dull, ache Aggravating factors: lying on that side Relieving factors: lying supine (Eval pain level was 6)   PRECAUTIONS: at risk for lymphedema Fall, DDD  HAND DOMINANCE: right  WEIGHT BEARING RESTRICTIONS: No  FALLS:  Has patient fallen in last 6 months? No  LIVING ENVIRONMENT: Patient lives with: her daughter Lives in: House/apartment Has following equipment at home: Single point cane  OCCUPATION: On disability  LEISURE: Walks some in her yard  PRIOR LEVEL OF FUNCTION: Independent   OBJECTIVE:  COGNITION: Overall cognitive status: Within functional limits for tasks assessed    POSTURE:  Forward head and rounded shoulders posture  UPPER EXTREMITY AROM/PROM:  A/PROM RIGHT    08/22/22   Shoulder extension 40  Shoulder flexion 146  Shoulder abduction 149  Shoulder internal rotation 67  Shoulder external rotation 77    (Blank rows = not tested)  A/PROM LEFT   08/22/22 LEFT  02/20/23 LEFT 03/13/23  Shoulder extension 32 44 48  Shoulder flexion 139 155 158  Shoulder abduction 152 148 168  Shoulder internal rotation 60 52 61  Shoulder external rotation 75 88     (Blank rows = not tested)  UPPER EXTREMITY STRENGTH: WFL  LYMPHEDEMA ASSESSMENTS:   LANDMARK RIGHT   08/22/22 RIGHT 02/20/23  10 cm proximal to olecranon process 40 40  Olecranon process 27.5 27.3  10 cm proximal to ulnar styloid process 25.2 23.8  Just proximal to ulnar styloid process 15.8 16  Across hand at thumb web space 17.9 18  At base of 2nd digit 5.9 5.9  (Blank rows = not tested)  LANDMARK LEFT   08/22/22 LEFT 02/20/23  10 cm proximal to olecranon process 40.2 37  Olecranon process 26.5 27.4  10 cm proximal to ulnar styloid process 25.1 24.7  Just proximal to ulnar styloid process 16.3 15.9  Across hand at thumb web space 18.3 18.4  At base of 2nd digit 6.5 6.1  (Blank rows = not tested)  L-DEX LYMPHEDEMA SCREENING: The patient was assessed using the L-Dex machine today to produce a lymphedema index baseline score. The patient will be reassessed on a regular basis (typically every 3 months) to obtain new L-Dex scores. If the score is > 6.5 points away from his/her baseline score indicating onset of subclinical lymphedema, it will be recommended to wear a compression garment for 4 weeks, 12 hours per day and then be reassessed. If the score continues to be > 6.5 points from baseline at reassessment, we will initiate lymphedema treatment. Assessing in this manner has a 95% rate of preventing clinically significant lymphedema.  LLIS:  EVAL: 33.82   03/25/23: 45%   QDASH: EVAL: 36%   03/25/23: 50%  TODAY'S DATE: 03/25/23: Reassessed goals and status for renewal  -  extended  POC Therapeutic Exercises Pulleys into flexion and abd x 2 mins each returning therapist demo and VC's to decrease Lt scapular compensation Roll ball up wall into flexion and Lt abd x 10 each returning therapist demo Supine Scapular Series with yellow theraband returning therapist demo x 10 each: narrow and wide grip flexion, ER, diagonals, horizontal abduction Manual Therapy MLD to Lt breast in supine as follows: Short neck, 5 diaphragmatic breaths, Lt inguinal and Rt axillary nodes, Lt axillo-inguinal and anterior inter-axillary anastomosis, then focused on Lt breast redirecting towards pathways then retracing all steps.   03/20/23: Therapeutic Exercises Pulleys into flexion and abd x 2 mins each returning therapist demo and VC's to decrease Lt scapular compensation Roll ball up wall into flexion and Lt abd x 10 each returning therapist demo Supine Scapular Series with yellow theraband returning therapist demo x 10 each: narrow and wide grip flexion, ER, diagonals, horizontal abduction Manual Therapy MLD to Lt breast in supine as follows: Short neck, 5 diaphragmatic breaths, Lt inguinal and Rt axillary nodes, Lt axillo-inguinal and anterior inter-axillary anastomosis, then focused on Lt breast redirecting towards pathways then retracing all steps.   03/18/23: Therapeutic Exercises Pulleys into flexion and abd x 2 mins each returning therapist demo and VC's to decrease Lt scapular compensation Roll ball up wall into flexion and Lt abd x 10 each returning therapist demo Supine Scapular Series with yellow theraband returning therapist demo x 10 each Manual Therapy MLD to Lt breast in supine as follows: Short neck, 5 diaphragmatic breaths, Lt inguinal and Rt axillary nodes, Lt axillo-inguinal and anterior inter-axillary anastomosis, then focused on Lt breast redirecting towards pathways then retracing all steps.  P/ROM to Lt shoulder into flexion, abd and D2 to pts end motions with scapular  depression by therapist throughout, pt with full P/ROM by end of session   PATIENT EDUCATION:  Education details: Supine scapular series with yellow theraband Person educated: Patient Education method: Explanation, Demonstration, tactile and VC's and handout issued Education comprehension: Patient verbalized understanding, returned demo and will benefit from further review  HOME EXERCISE PROGRAM: Wear compression bra as much as possible Contact 2nd to Nature to obtain another compression bra Seated and supine AA/ROM Lt UE dowel exercises and Self Lt breast MLD Supine scapular series   ASSESSMENT:  CLINICAL IMPRESSION: Pt shows worsening of QDASH and LLIS and upon questioning pt overall reports that it may just be after a bad weekend.  Pt will finish out what is scheduled at 2x per week x 3 weeks to focus on completing goals.  No goals yet met.   Pt will benefit from skilled therapeutic intervention to improve on the following deficits: Decreased knowledge of precautions, pain, decreased ROM, postural dysfunction, decreased knowledge of condition, increased edema  PT treatment/interventions: ADL/Self care home management, Therapeutic exercises, Therapeutic activity, Patient/Family education, Self Care, Orthotic/Fit training, Manual lymph drainage, Compression bandaging, scar mobilization, Taping, Vasopneumatic device, Manual therapy, and Re-evaluation   REHAB POTENTIAL: Good  CLINICAL DECISION MAKING: Stable/uncomplicated  EVALUATION COMPLEXITY: Low   GOALS: Goals reviewed with patient? YES  LONG TERM GOALS: (STG=LTG)    Name Target Date Goal status  1 Pt will be independent in self MLD for long term management of L breast lymphedema.    03/20/2023 MET  2 Pt will obtain appropriate compression bra for long term management of lymphedema. 03/20/23 MET    3 Pt will report a 75% decrease in pain in her L breast and axilla to allow improved  comfort.   04/22/23 ONGOING  4 Pt will  be independent in a home exercise program for continued stretching and strengthening.   04/22/23 ONGOING    PLAN:  PT FREQUENCY/DURATION: 2x/wk for 3 wks  PLAN FOR NEXT SESSION: Update goals and renewal next. Review supine scapular series; Cont MLD to L breast and review, cont STM to L axilla/MFR and cont/review AA/ROM exs and progress HEP prn.    Over Head Pull: Narrow and Wide Grip   Cancer Rehab [phone]   On back, knees bent, feet flat, band across thighs, elbows straight but relaxed. Pull hands apart (start). Keeping elbows straight, bring arms up and over head, hands toward floor. Keep pull steady on band. Hold momentarily. Return slowly, keeping pull steady, back to start. Then do same with a wider grip on the band (past shoulder width) Repeat _5-10__ times. Band color __yellow____   Side Pull: Double Arm   On back, knees bent, feet flat. Arms perpendicular to body, shoulder level, elbows straight but relaxed. Pull arms out to sides, elbows straight. Resistance band comes across collarbones, hands toward floor. Hold momentarily. Slowly return to starting position. Repeat _5-10__ times. Band color _yellow____   Sword   On back, knees bent, feet flat, left hand on left hip, right hand above left. Pull right arm DIAGONALLY (hip to shoulder) across chest. Bring right arm along head toward floor. Hold momentarily. Slowly return to starting position. Repeat _5-10__ times. Do with left arm. Band color _yellow_____   Shoulder Rotation: Double Arm   On back, knees bent, feet flat, elbows tucked at sides, bent 90, hands palms up. Pull hands apart and down toward floor, keeping elbows near sides. Hold momentarily. Slowly return to starting position. Repeat _5-10__ times. Band color __yellow____

## 2023-03-28 ENCOUNTER — Ambulatory Visit: Payer: 59

## 2023-03-28 DIAGNOSIS — I890 Lymphedema, not elsewhere classified: Secondary | ICD-10-CM | POA: Diagnosis not present

## 2023-03-28 DIAGNOSIS — R293 Abnormal posture: Secondary | ICD-10-CM

## 2023-03-28 DIAGNOSIS — L599 Disorder of the skin and subcutaneous tissue related to radiation, unspecified: Secondary | ICD-10-CM

## 2023-03-28 DIAGNOSIS — Z170 Estrogen receptor positive status [ER+]: Secondary | ICD-10-CM

## 2023-03-28 NOTE — Therapy (Signed)
OUTPATIENT PHYSICAL THERAPY BREAST CANCER TREATMENT   Patient Name: Kelli Estrada MRN: [id number] DOB:1961-06-01, 62 y.o., female Today's Date: 03/28/2023   PT End of Session - 03/28/23 1038     Visit Number 9    Number of Visits 16    Date for PT Re-Evaluation 04/22/23    PT Start Time 1036    PT Stop Time 1133    PT Time Calculation (min) 57 min    Activity Tolerance Patient tolerated treatment well    Behavior During Therapy [facility] for tasks assessed/performed                Past Medical History:  Diagnosis Date   Anxiety    Chronic pain    Depression    Family history of breast cancer 08/23/2022   H/O degenerative disc disease    History of radiation therapy    Left breast- 10/25/22-12/11/22- Dr. Antony Blackbird   History of radiation therapy    Left breast-10/25/22-12/11/22- Dr. Antony Blackbird   Pre-diabetes    PTSD (post-traumatic stress disorder)    Sleep apnea    Past Surgical History:  Procedure Laterality Date   ABDOMINAL HYSTERECTOMY     BREAST LUMPECTOMY WITH SENTINEL LYMPH NODE BIOPSY Left 09/14/2022   Procedure: LEFT BREAST CENTRAL LUMPECTOMY WITH SENTINEL LYMPH NODE BX;  Surgeon: Griselda Miner, MD;  Location: [facility];  Service: General;  Laterality: Left;   COLONOSCOPY WITH PROPOFOL N/A 03/15/2016   Procedure: COLONOSCOPY WITH PROPOFOL;  Surgeon: Dorena Cookey, MD;  Location: [facility];  Service: Endoscopy;  Laterality: N/A;   NECK SURGERY     Patient Active Problem List   Diagnosis Date Noted   Genetic testing 08/27/2022   Family history of breast cancer 08/23/2022   Malignant neoplasm of central portion of left breast in female, estrogen receptor positive (HCC) 08/20/2022    REFERRING PROVIDER: Erven Colla, PA-C  REFERRING DIAG:C50.112,Z17.0 (ICD-10-CM) - Malignant neoplasm of central portion of left breast in female, estrogen receptor positive (HCC)  THERAPY DIAG:  Lymphedema, not elsewhere classified  Disorder of the skin and  subcutaneous tissue related to radiation, unspecified  Abnormal posture  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 Also I slept on that side (Lt) some last night so made that me a bit sore as well but I used to couldn't lay on that side at all. I finally had my first free massage Tuesday and that felt really good! I have one more I get to use from the cancer center.   PERTINENT HISTORY:  Patient was diagnosed on 07/18/2022 with left grade 2 invasive ductal carcinoma breast cancer. It measures 1.9 cm and is located in the central quadrant. It is ER/PR positive and HER2 negative with a Ki67 of 10%. 09/14/22 L breast lumpectomy and SLNB 1/3. Completed radiation. Hx of chronic pain, degenerative disc disease, PTSD  PATIENT GOALS:   to decrease the pain under the arm and on the trunk  PAIN:  Are you having pain? Yes: NPRS scale: 5/10 Pain location: Lt breast near incision Pain description: sore Aggravating factors: lying on that side Relieving factors: lying supine (Eval pain level was 6)   PRECAUTIONS: at risk for lymphedema Fall, DDD  HAND DOMINANCE: right  WEIGHT BEARING RESTRICTIONS: No  FALLS:  Has patient fallen in last 6 months? No  LIVING ENVIRONMENT: Patient lives with: her daughter Lives in: House/apartment Has following equipment at home: Single point cane  OCCUPATION: On disability  LEISURE: Walks some in her yard  PRIOR LEVEL OF FUNCTION: Independent   OBJECTIVE:  COGNITION: Overall cognitive status: Within functional limits for tasks  assessed    POSTURE:  Forward head and rounded shoulders posture  UPPER EXTREMITY AROM/PROM:  A/PROM RIGHT   08/22/22   Shoulder extension 40  Shoulder flexion 146  Shoulder abduction 149  Shoulder internal rotation 67  Shoulder external rotation 77    (Blank rows = not tested)  A/PROM LEFT   08/22/22 LEFT  02/20/23 LEFT 03/13/23  Shoulder extension 32 44 48  Shoulder flexion 139 155 158  Shoulder abduction 152 148 168  Shoulder internal rotation 60 52 61  Shoulder external rotation 75 88     (Blank rows = not tested)  UPPER EXTREMITY STRENGTH: WFL  LYMPHEDEMA ASSESSMENTS:   LANDMARK RIGHT   08/22/22 RIGHT 02/20/23  10 cm proximal to olecranon process 40 40  Olecranon process 27.5 27.3  10 cm proximal to ulnar styloid process 25.2 23.8  Just proximal to ulnar styloid process 15.8 16  Across hand at thumb web space 17.9 18  At base of 2nd digit 5.9 5.9  (Blank rows = not tested)  LANDMARK LEFT   08/22/22 LEFT 02/20/23  10 cm proximal to olecranon process 40.2 37  Olecranon process 26.5 27.4  10 cm proximal to ulnar styloid process 25.1 24.7  Just proximal to ulnar styloid process 16.3 15.9  Across hand at thumb web space 18.3 18.4  At base of 2nd digit 6.5 6.1  (Blank rows = not tested)  L-DEX LYMPHEDEMA SCREENING: The patient was assessed using the L-Dex machine today to produce a lymphedema index baseline score. The patient will be reassessed on a regular basis (typically every 3 months) to obtain new L-Dex scores. If the score is > 6.5 points away from his/her baseline score indicating onset of subclinical lymphedema, it will be recommended to wear a compression garment for 4 weeks, 12 hours per day and then be reassessed. If the score continues to be > 6.5 points from baseline at reassessment, we will initiate lymphedema treatment. Assessing in this manner has a 95% rate of preventing clinically significant lymphedema.  LLIS:  EVAL: 33.82   03/25/23:  45%   QDASH: EVAL:  36%   03/25/23: 50%  TODAY'S DATE: 03/28/23: Therapeutic Exercises Pulleys into flexion and abd x 2 mins each with VC's to decrease Lt scapular compensation Roll yellow ball into Lt abd and flex x 10 each Supine over half foam roll for bil UE horz abd, then bil UE scaption into a "V" and then bil abd into a "snow angel" x 5 each with 5 sec holds Manual Therapy MLD to Lt breast in supine as follows: Short neck, 5 diaphragmatic breaths, Lt inguinal and Rt axillary nodes, Lt axillo-inguinal and anterior inter-axillary anastomosis, then focused on Lt breast redirecting towards pathways then retracing all steps.  Scar tissue mobs to Lt breast incision where pt reports tightness  03/25/23: Reassessed goals and status for renewal  - extended POC Therapeutic Exercises Pulleys into flexion and abd x 2 mins each returning therapist demo and VC's to decrease Lt scapular compensation Roll ball up wall into flexion and Lt abd x 10 each returning therapist demo Supine Scapular Series with yellow theraband returning therapist demo x 10 each: narrow and wide grip flexion, ER, diagonals, horizontal abduction Manual Therapy MLD to Lt breast in supine as follows: Short neck, 5 diaphragmatic breaths, Lt inguinal and Rt axillary nodes, Lt axillo-inguinal and anterior inter-axillary anastomosis, then focused on Lt breast redirecting towards pathways then retracing all steps.   03/20/23: Therapeutic Exercises Pulleys into flexion and abd x 2 mins each returning therapist demo and VC's to decrease Lt scapular compensation Roll ball up wall into flexion and Lt abd x 10 each returning therapist demo Supine Scapular Series with yellow theraband returning therapist demo x 10 each: narrow and wide grip flexion, ER, diagonals, horizontal abduction Manual Therapy MLD to Lt breast in supine as follows: Short neck, 5 diaphragmatic breaths, Lt inguinal and Rt axillary nodes, Lt axillo-inguinal and anterior inter-axillary  anastomosis, then focused on Lt breast redirecting towards pathways then retracing all steps.   03/18/23: Therapeutic Exercises Pulleys into flexion and abd x 2 mins each returning therapist demo and VC's to decrease Lt scapular compensation Roll ball up wall into flexion and Lt abd x 10 each returning therapist demo Supine Scapular Series with yellow theraband returning therapist demo x 10 each Manual Therapy MLD to Lt breast in supine as follows: Short neck, 5 diaphragmatic breaths, Lt inguinal and Rt axillary nodes, Lt axillo-inguinal and anterior inter-axillary anastomosis, then focused on Lt breast redirecting towards pathways then retracing all steps.  P/ROM to Lt shoulder into flexion, abd and D2 to pts end motions with scapular depression by therapist throughout, pt with full P/ROM by end of session   PATIENT EDUCATION:  Education details: Supine scapular series with yellow theraband Person educated: Patient Education method: Explanation, Demonstration, tactile and VC's and handout issued Education comprehension: Patient verbalized understanding, returned demo and will benefit from further review  HOME EXERCISE PROGRAM: Wear compression bra as much as possible Contact 2nd to [place] to obtain another compression bra Seated and supine AA/ROM Lt UE dowel exercises and Self Lt breast MLD Supine scapular series   ASSESSMENT:  CLINICAL IMPRESSION: Progressed pt to include more A/ROM stretches of Lt shoulder while working on posture being supine on half foam roll. Then continued with MLD to Lt breast with scar tissue massage on breast incision where pt reports feeling most discomfort.    Pt will benefit from skilled therapeutic intervention to improve on the following deficits: Decreased knowledge of precautions, pain, decreased ROM, postural dysfunction, decreased  knowledge of condition, increased edema  PT treatment/interventions: ADL/Self care home management, Therapeutic exercises,  Therapeutic activity, Patient/Family education, Self Care, Orthotic/Fit training, Manual lymph drainage, Compression bandaging, scar mobilization, Taping, Vasopneumatic device, Manual therapy, and Re-evaluation   REHAB POTENTIAL: Good  CLINICAL DECISION MAKING: Stable/uncomplicated  EVALUATION COMPLEXITY: Low   GOALS: Goals reviewed with patient? YES  LONG TERM GOALS: (STG=LTG)    Name Target Date Goal status  1 Pt will be independent in self MLD for long term management of L breast lymphedema.    03/20/2023 MET  2 Pt will obtain appropriate compression bra for long term management of lymphedema. 03/20/23 MET    3 Pt will report a 75% decrease in pain in her L breast and axilla to allow improved comfort.   04/22/23 ONGOING  4 Pt will be independent in a home exercise program for continued stretching and strengthening.   04/22/23 ONGOING    PLAN:  PT FREQUENCY/DURATION: 2x/wk for 3 wks  PLAN FOR NEXT SESSION: Cont A/AA/ROM of Lt shoulder; Review supine scapular series prn; Cont MLD to L breast and review, cont STM to L axilla/MFR and cont/review AA/ROM exs and progress HEP prn.    Berna Spare, PTA 03/28/23 11:34 AM   Over Head Pull: Narrow and Wide Grip   Cancer Rehab [phone]   On back, knees bent, feet flat, band across thighs, elbows straight but relaxed. Pull hands apart (start). Keeping elbows straight, bring arms up and over head, hands toward floor. Keep pull steady on band. Hold momentarily. Return slowly, keeping pull steady, back to start. Then do same with a wider grip on the band (past shoulder width) Repeat _5-10__ times. Band color __yellow____   Side Pull: Double Arm   On back, knees bent, feet flat. Arms perpendicular to body, shoulder level, elbows straight but relaxed. Pull arms out to sides, elbows straight. Resistance band comes across collarbones, hands toward floor. Hold momentarily. Slowly return to starting position. Repeat _5-10__ times. Band  color _yellow____   Sword   On back, knees bent, feet flat, left hand on left hip, right hand above left. Pull right arm DIAGONALLY (hip to shoulder) across chest. Bring right arm along head toward floor. Hold momentarily. Slowly return to starting position. Repeat _5-10__ times. Do with left arm. Band color _yellow_____   Shoulder Rotation: Double Arm   On back, knees bent, feet flat, elbows tucked at sides, bent 90, hands palms up. Pull hands apart and down toward floor, keeping elbows near sides. Hold momentarily. Slowly return to starting position. Repeat _5-10__ times. Band color __yellow____

## 2023-03-28 NOTE — Addendum Note (Signed)
Addended by: Gwenevere Abbot R on: 03/28/2023 10:55 AM   Modules accepted: Orders

## 2023-04-01 ENCOUNTER — Ambulatory Visit: Payer: 59

## 2023-04-01 ENCOUNTER — Other Ambulatory Visit: Payer: Self-pay

## 2023-04-01 DIAGNOSIS — L599 Disorder of the skin and subcutaneous tissue related to radiation, unspecified: Secondary | ICD-10-CM

## 2023-04-01 DIAGNOSIS — I890 Lymphedema, not elsewhere classified: Secondary | ICD-10-CM | POA: Diagnosis not present

## 2023-04-01 DIAGNOSIS — R293 Abnormal posture: Secondary | ICD-10-CM

## 2023-04-01 DIAGNOSIS — C50112 Malignant neoplasm of central portion of left female breast: Secondary | ICD-10-CM

## 2023-04-01 NOTE — Therapy (Signed)
OUTPATIENT PHYSICAL THERAPY BREAST CANCER TREATMENT   Patient Name: Kelli Estrada Age MRN: [id number] DOB:1961-09-17, 62 y.o., female Today's Date: 04/01/2023   PT End of Session - 04/01/23 1108     Visit Number 10    Number of Visits 16    Date for PT Re-Evaluation 04/22/23    PT Start Time 1106    PT Stop Time 1201    PT Time Calculation (min) 55 min    Activity Tolerance Patient tolerated treatment well    Behavior During Therapy [facility] for tasks assessed/performed                Past Medical History:  Diagnosis Date   Anxiety    Chronic pain    Depression    Family history of breast cancer 08/23/2022   H/O degenerative disc disease    History of radiation therapy    Left breast- 10/25/22-12/11/22- Dr. Antony Blackbird   History of radiation therapy    Left breast-10/25/22-12/11/22- Dr. Antony Blackbird   Pre-diabetes    PTSD (post-traumatic stress disorder)    Sleep apnea    Past Surgical History:  Procedure Laterality Date   ABDOMINAL HYSTERECTOMY     BREAST LUMPECTOMY WITH SENTINEL LYMPH NODE BIOPSY Left 09/14/2022   Procedure: LEFT BREAST CENTRAL LUMPECTOMY WITH SENTINEL LYMPH NODE BX;  Surgeon: Griselda Miner, MD;  Location: [facility];  Service: General;  Laterality: Left;   COLONOSCOPY WITH PROPOFOL N/A 03/15/2016   Procedure: COLONOSCOPY WITH PROPOFOL;  Surgeon: Dorena Cookey, MD;  Location: [facility];  Service: Endoscopy;  Laterality: N/A;   NECK SURGERY     Patient Active Problem List   Diagnosis Date Noted   Genetic testing 08/27/2022   Family history of breast cancer 08/23/2022   Malignant neoplasm of central portion of left breast in female, estrogen receptor positive (HCC) 08/20/2022    REFERRING PROVIDER: Erven Colla, PA-C  REFERRING DIAG:C50.112,Z17.0 (ICD-10-CM) - Malignant neoplasm of central portion of left breast in female, estrogen receptor positive (HCC)  THERAPY DIAG:  Lymphedema, not elsewhere classified  Disorder of the skin  and subcutaneous tissue related to radiation, unspecified  Abnormal posture  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 Today I'm mostly  feeling my shoulder bothering me, but my pain isn't nearly as bad as it was.   PERTINENT HISTORY:  Patient was diagnosed on 07/18/2022 with left grade 2 invasive ductal carcinoma breast cancer. It measures 1.9 cm and is located in the central quadrant. It is ER/PR positive and HER2 negative with a Ki67 of 10%. 09/14/22 L breast lumpectomy and SLNB 1/3. Completed radiation. Hx of chronic pain, degenerative disc disease, PTSD  PATIENT GOALS:   to decrease the pain under the arm and on the trunk  PAIN:  Are you having pain? Yes: NPRS scale: 7-8/10 Pain location: Lt shoulder  Pain description: sore Aggravating factors: lying on that side Relieving factors: lying supine   PRECAUTIONS: at risk for lymphedema Fall, DDD  HAND DOMINANCE: right  WEIGHT BEARING RESTRICTIONS: No  FALLS:  Has patient fallen in last 6 months? No  LIVING ENVIRONMENT: Patient lives with: her daughter Lives in: House/apartment Has following equipment at home: Single point cane  OCCUPATION: On disability  LEISURE: Walks some in her yard  PRIOR LEVEL OF FUNCTION: Independent   OBJECTIVE:  COGNITION: Overall cognitive status: Within functional limits for tasks assessed    POSTURE:  Forward head and rounded shoulders posture  UPPER EXTREMITY AROM/PROM:  A/PROM RIGHT   08/22/22   Shoulder extension 40  Shoulder flexion 146  Shoulder abduction 149  Shoulder  internal rotation 67  Shoulder external rotation 77    (Blank rows = not tested)  A/PROM LEFT   08/22/22 LEFT  02/20/23 LEFT 03/13/23  Shoulder extension 32 44 48  Shoulder flexion 139 155 158  Shoulder abduction 152 148 168  Shoulder internal rotation 60 52 61  Shoulder external rotation 75 88     (Blank rows = not tested)  UPPER EXTREMITY STRENGTH: WFL  LYMPHEDEMA ASSESSMENTS:   LANDMARK RIGHT   08/22/22 RIGHT 02/20/23  10 cm proximal to olecranon process 40 40  Olecranon process 27.5 27.3  10 cm proximal to ulnar styloid process 25.2 23.8  Just proximal to ulnar styloid process 15.8 16  Across hand at thumb web space 17.9 18  At base of 2nd digit 5.9 5.9  (Blank rows = not tested)  LANDMARK LEFT   08/22/22 LEFT 02/20/23  10 cm proximal to olecranon process 40.2 37  Olecranon process 26.5 27.4  10 cm proximal to ulnar styloid process 25.1 24.7  Just proximal to ulnar styloid process 16.3 15.9  Across hand at thumb web space 18.3 18.4  At base of 2nd digit 6.5 6.1  (Blank rows = not tested)  L-DEX LYMPHEDEMA SCREENING: The patient was assessed using the L-Dex machine today to produce a lymphedema index baseline score. The patient will be reassessed on a regular basis (typically every 3 months) to obtain new L-Dex scores. If the score is > 6.5 points away from his/her baseline score indicating onset of subclinical lymphedema, it will be recommended to wear a compression garment for 4 weeks, 12 hours per day and then be reassessed. If the score continues to be > 6.5 points from baseline at reassessment, we will initiate lymphedema treatment. Assessing in this manner has a 95% rate of preventing clinically significant lymphedema.  LLIS:  EVAL: 33.82   03/25/23: 45%   QDASH: EVAL: 36%   03/25/23: 50%  TODAY'S DATE: 04/01/23: Therapeutic Exercises Pulleys into flexion and abd x 2 mins each with VC's to decrease Lt scapular compensation Roll yellow ball into Lt abd and flex x 10  each Supine over half  foam roll for bil UE horz abd, then bil UE scaption into a "V" and then bil abd into a "snow angel" x 5 each with 5 sec holds Standing with back against wall for bil UE 3 way raises to 2# x 10 each direction except for abd with 1# Manual Therapy MLD to Lt breast in supine as follows: Short neck, 5 diaphragmatic breaths, Lt inguinal and Rt axillary nodes, Lt axillo-inguinal and anterior inter-axillary anastomosis, then focused on Lt breast redirecting towards pathways then retracing all steps.  Scar tissue mobs to Lt breast incision where pt reports tightness and at axilla where pt reports increased tenderness and scar tissue is firm here P/ROM to Lt shoulder for scar tissue mobs, into flex and abd, with scapular depression throughout   03/28/23: Therapeutic Exercises Pulleys into flexion and abd x 2 mins each with VC's to decrease Lt scapular compensation Roll yellow ball into Lt abd and flex x 10 each Supine over half foam roll for bil UE horz abd, then bil UE scaption into a "V" and then bil abd into a "snow angel" x 5 each with 5 sec holds Manual Therapy MLD to Lt breast in supine as follows: Short neck, 5 diaphragmatic breaths, Lt inguinal and Rt axillary nodes, Lt axillo-inguinal and anterior inter-axillary anastomosis, then focused on Lt breast redirecting towards pathways then retracing all steps.  Scar tissue mobs to Lt breast incision where pt reports tightness  03/25/23: Reassessed goals and status for renewal  - extended POC Therapeutic Exercises Pulleys into flexion and abd x 2 mins each returning therapist demo and VC's to decrease Lt scapular compensation Roll ball up wall into flexion and Lt abd x 10 each returning therapist demo Supine Scapular Series with yellow theraband returning therapist demo x 10 each: narrow and wide grip flexion, ER, diagonals, horizontal abduction Manual Therapy MLD to Lt breast in supine as follows: Short neck, 5 diaphragmatic  breaths, Lt inguinal and Rt axillary nodes, Lt axillo-inguinal and anterior inter-axillary anastomosis, then focused on Lt breast redirecting towards pathways then retracing all steps.   03/20/23: Therapeutic Exercises Pulleys into flexion and abd x 2 mins each returning therapist demo and VC's to decrease Lt scapular compensation Roll ball up wall into flexion and Lt abd x 10 each returning therapist demo Supine Scapular Series with yellow theraband returning therapist demo x 10 each: narrow and wide grip flexion, ER, diagonals, horizontal abduction Manual Therapy MLD to Lt breast in supine as follows: Short neck, 5 diaphragmatic breaths, Lt inguinal and Rt axillary nodes, Lt axillo-inguinal and anterior inter-axillary anastomosis, then focused on Lt breast redirecting towards pathways then retracing all steps.   03/18/23: Therapeutic Exercises Pulleys into flexion and abd x 2 mins each returning therapist demo and VC's to decrease Lt scapular compensation Roll ball up wall into flexion and Lt abd x 10 each returning therapist demo Supine Scapular Series with yellow theraband returning therapist demo x 10 each Manual Therapy MLD to Lt breast in supine as follows: Short neck, 5 diaphragmatic breaths, Lt inguinal and Rt axillary nodes, Lt axillo-inguinal and anterior inter-axillary anastomosis, then focused on Lt breast redirecting towards pathways then retracing all steps.  P/ROM to Lt shoulder into flexion, abd and D2 to pts end motions with scapular depression by therapist throughout, pt with full P/ROM by end of session   PATIENT EDUCATION:  Education details: Supine scapular series with yellow theraband Person educated: Patient Education method: Explanation, Demonstration, tactile and VC's and handout issued Education  comprehension: Patient verbalized understanding, returned demo and will benefit from further review  HOME EXERCISE PROGRAM: Wear compression bra as much as possible Contact  2nd to Nature to obtain another compression bra Seated and supine AA/ROM Lt UE dowel exercises and Self Lt breast MLD Supine scapular series   ASSESSMENT:  CLINICAL IMPRESSION: Progressed pt to include more A/ROM stretches of Lt shoulder while working on posture being supine on half foam roll. Then continued with MLD to Lt breast with scar tissue massage on breast incision where pt reports feeling most discomfort.    Pt will benefit from skilled therapeutic intervention to improve on the following deficits: Decreased knowledge of precautions, pain, decreased ROM, postural dysfunction, decreased knowledge of condition, increased edema  PT treatment/interventions: ADL/Self care home management, Therapeutic exercises, Therapeutic activity, Patient/Family education, Self Care, Orthotic/Fit training, Manual lymph drainage, Compression bandaging, scar mobilization, Taping, Vasopneumatic device, Manual therapy, and Re-evaluation   REHAB POTENTIAL: Good  CLINICAL DECISION MAKING: Stable/uncomplicated  EVALUATION COMPLEXITY: Low   GOALS: Goals reviewed with patient? YES  LONG TERM GOALS: (STG=LTG)    Name Target Date Goal status  1 Pt will be independent in self MLD for long term management of L breast lymphedema.    03/20/2023 MET  2 Pt will obtain appropriate compression bra for long term management of lymphedema. 03/20/23 MET    3 Pt will report a 75% decrease in pain in her L breast and axilla to allow improved comfort.   04/22/23 ONGOING  4 Pt will be independent in a home exercise program for continued stretching and strengthening.   04/22/23 ONGOING    PLAN:  PT FREQUENCY/DURATION: 2x/wk for 3 wks  PLAN FOR NEXT SESSION: Cont A/AA/ROM of Lt shoulder; Review bil UE 3 way raises; Cont MLD to L breast and review, cont STM to L axilla/MFR and cont/review AA/ROM exs and progress HEP prn.    Berna Spare, PTA 04/01/23 12:05 PM   Over Head Pull: Narrow and Wide  Grip   Cancer Rehab [phone]   On back, knees bent, feet flat, band across thighs, elbows straight but relaxed. Pull hands apart (start). Keeping elbows straight, bring arms up and over head, hands toward floor. Keep pull steady on band. Hold momentarily. Return slowly, keeping pull steady, back to start. Then do same with a wider grip on the band (past shoulder width) Repeat _5-10__ times. Band color __yellow____   Side Pull: Double Arm   On back, knees bent, feet flat. Arms perpendicular to body, shoulder level, elbows straight but relaxed. Pull arms out to sides, elbows straight. Resistance band comes across collarbones, hands toward floor. Hold momentarily. Slowly return to starting position. Repeat _5-10__ times. Band color _yellow____   Sword   On back, knees bent, feet flat, left hand on left hip, right hand above left. Pull right arm DIAGONALLY (hip to shoulder) across chest. Bring right arm along head toward floor. Hold momentarily. Slowly return to starting position. Repeat _5-10__ times. Do with left arm. Band color _yellow_____   Shoulder Rotation: Double Arm   On back, knees bent, feet flat, elbows tucked at sides, bent 90, hands palms up. Pull hands apart and down toward floor, keeping elbows near sides. Hold momentarily. Slowly return to starting position. Repeat _5-10__ times. Band color __yellow____   3 Way Raises:      Starting Position:  Leaning against wall, walk feet a few inches away from the wall and make tummy tight (tuck hips underneath you) Press back/shoulders/head against  wall as much as possible. Keep thumbs up to ceiling, elbows straight and shoulders relaxed/down throughout.  1. Lift arms in front to shoulder height 2. Lift arms a little wider into a "V" to shoulder height 3. Lift arms out to sides in a "T" to shoulder height  Perform 10 times in each direction. Hold 1-2 lbs to start with and work up to 2-3 sets of 10/day. Perform 3-4  times/week. Increase weight as able, decreasing sets of 10 each time you increase weights, then slowly working your way back up to 2-3 sets each time.    Cancer Rehab [phone]

## 2023-04-02 ENCOUNTER — Encounter: Payer: Self-pay | Admitting: Adult Health

## 2023-04-02 ENCOUNTER — Inpatient Hospital Stay: Payer: 59 | Admitting: Pharmacist

## 2023-04-02 ENCOUNTER — Inpatient Hospital Stay: Payer: 59 | Attending: Hematology and Oncology | Admitting: Adult Health

## 2023-04-02 ENCOUNTER — Other Ambulatory Visit: Payer: Self-pay

## 2023-04-02 ENCOUNTER — Inpatient Hospital Stay: Payer: 59

## 2023-04-02 VITALS — BP 104/61 | HR 67 | Temp 98.2°F | Ht 63.78 in | Wt 286.0 lb

## 2023-04-02 DIAGNOSIS — Z170 Estrogen receptor positive status [ER+]: Secondary | ICD-10-CM

## 2023-04-02 DIAGNOSIS — Z803 Family history of malignant neoplasm of breast: Secondary | ICD-10-CM | POA: Insufficient documentation

## 2023-04-02 DIAGNOSIS — Z79811 Long term (current) use of aromatase inhibitors: Secondary | ICD-10-CM | POA: Diagnosis not present

## 2023-04-02 DIAGNOSIS — Z90710 Acquired absence of both cervix and uterus: Secondary | ICD-10-CM | POA: Insufficient documentation

## 2023-04-02 DIAGNOSIS — C50112 Malignant neoplasm of central portion of left female breast: Secondary | ICD-10-CM | POA: Insufficient documentation

## 2023-04-02 DIAGNOSIS — Z801 Family history of malignant neoplasm of trachea, bronchus and lung: Secondary | ICD-10-CM | POA: Insufficient documentation

## 2023-04-02 LAB — CBC WITH DIFFERENTIAL (CANCER CENTER ONLY)
Abs Immature Granulocytes: 0.01 10*3/uL (ref 0.00–0.07)
Basophils Absolute: 0 10*3/uL (ref 0.0–0.1)
Basophils Relative: 1 %
Eosinophils Absolute: 0.1 10*3/uL (ref 0.0–0.5)
Eosinophils Relative: 3 %
HCT: 37 % (ref 36.0–46.0)
Hemoglobin: 12.1 g/dL (ref 12.0–15.0)
Immature Granulocytes: 0 %
Lymphocytes Relative: 21 %
Lymphs Abs: 0.9 10*3/uL (ref 0.7–4.0)
MCH: 29.8 pg (ref 26.0–34.0)
MCHC: 32.7 g/dL (ref 30.0–36.0)
MCV: 91.1 fL (ref 80.0–100.0)
Monocytes Absolute: 0.3 10*3/uL (ref 0.1–1.0)
Monocytes Relative: 8 %
Neutro Abs: 3 10*3/uL (ref 1.7–7.7)
Neutrophils Relative %: 67 %
Platelet Count: 160 10*3/uL (ref 150–400)
RBC: 4.06 MIL/uL (ref 3.87–5.11)
RDW: 14.6 % (ref 11.5–15.5)
WBC Count: 4.4 10*3/uL (ref 4.0–10.5)
nRBC: 0 % (ref 0.0–0.2)

## 2023-04-02 LAB — CMP (CANCER CENTER ONLY)
ALT: 14 U/L (ref 0–44)
AST: 14 U/L — ABNORMAL LOW (ref 15–41)
Albumin: 3.5 g/dL (ref 3.5–5.0)
Alkaline Phosphatase: 54 U/L (ref 38–126)
Anion gap: 6 (ref 5–15)
BUN: 9 mg/dL (ref 6–20)
CO2: 28 mmol/L (ref 22–32)
Calcium: 8.6 mg/dL — ABNORMAL LOW (ref 8.9–10.3)
Chloride: 107 mmol/L (ref 98–111)
Creatinine: 0.75 mg/dL (ref 0.44–1.00)
GFR, Estimated: >60 mL/min (ref >60)
Glucose, Bld: 106 mg/dL — ABNORMAL HIGH (ref 70–99)
Potassium: 4.5 mmol/L (ref 3.5–5.1)
Sodium: 141 mmol/L (ref 135–145)
Total Bilirubin: 0.4 mg/dL (ref 0.3–1.2)
Total Protein: 5.8 g/dL — ABNORMAL LOW (ref 6.5–8.1)

## 2023-04-02 NOTE — Progress Notes (Signed)
[facility]       Telephone: [phone]?Fax: [phone]   Oncology Clinical Pharmacist Practitioner Progress Note  Kelli Estrada was contacted via in-person to discuss her chemotherapy regimen for abemaciclib which they receive under the care of Dr. Rachel Moulds.   Current treatment regimen and start date Abemaciclib (12/28/22) Anastrozole (12/31/22)   Interval History She continues on abemaciclib 100 mg by mouth every 12 hours on days 1 to 28 of a 28-day cycle. This is being given in combination with anastrozole . Therapy is planned to continue until two years in the adjuvant setting per the monarchE trial data. She was seen today as a follow up to her abemaciclib management. Kelli Estrada was last seen by clinical pharmacy on 02/12/23 and Dr. Al Pimple on 03/05/23.   Response to Therapy Kelli Estrada is doing well.  She states that the nausea that she was having is much improved and she is not needing to use ondansetron or prochlorperazine.  She also states that her diarrhea is improved.  She does report missing "a few" abemaciclib doses.  Her serum creatinine, and calcium are now WNL.  Her corrected calcium level is 9 mg/dL today after adjusting for albumin.  We did discuss potentially going up on the abemaciclib dose and she would like to consider this and potentially go up when she next sees Dr. Al Pimple with labs on 05/01/23 we felt this to be reasonable.  We will see her again in 8 weeks with labs.  We did add Osteo Bi-Flex and vitamin D3 to her medication list that she states that she has started these recently.  She does report feeling sad recently and we offered to send an ambulatory referral to our social services team.  However, she states that she does have their numbers and she will be contacting them soon.  Labs, vitals, treatment parameters, and manufacturer guidelines assessing toxicity were reviewed with Kelli Estrada today. Based on these values, patient is in agreement to  continue abemaciclib therapy at this time.  Allergies No Known Allergies  Vitals    04/02/2023    2:14 PM 03/05/2023   12:47 PM 02/12/2023    1:18 PM  Oncology Vitals  Height 162 cm 160 cm 160 cm  Weight 129.729 kg 116.291 kg 114.533 kg  Weight (lbs) 286 lbs 256 lbs 6 oz 252 lbs 8 oz  BMI 49.43 kg/m2   49.43 kg/m2 45.41 kg/m2   45.41 kg/m2 44.73 kg/m2   44.73 kg/m2  Temp 98.2 F (36.8 C) 97.7 F (36.5 C) 97.8 F (36.6 C)  Pulse Rate 67 73 77  BP 104/61 128/74 103/86  Resp  19 18  SpO2 98 % 97 % 100 %  BSA (m2) 2.42 m2   2.42 m2 2.27 m2   2.27 m2 2.26 m2   2.26 m2    Laboratory Data    Latest Ref Rng & Units 04/02/2023    1:32 PM 03/05/2023   11:57 AM 02/12/2023   12:13 PM  CBC EXTENDED  WBC 4.0 - 10.5 K/uL 4.4  2.7  4.3   RBC 3.87 - 5.11 MIL/uL 4.06  4.42  4.42   Hemoglobin 12.0 - 15.0 g/dL 95.2  84.1  32.4   HCT 36.0 - 46.0 % 37.0  39.7  40.2   Platelets 150 - 400 K/uL 160  124  157   NEUT# 1.7 - 7.7 K/uL 3.0  1.6  3.1   Lymph# 0.7 - 4.0 K/uL 0.9  0.7  0.7        Latest Ref Rng & Units 04/02/2023    1:32 PM 03/05/2023   11:57 AM 02/12/2023   12:13 PM  CMP  Glucose 70 - 99 mg/dL 213  086  578   BUN 6 - 20 mg/dL 9  12  12    Creatinine 0.44 - 1.00 mg/dL 4.69  6.29  5.28   Sodium 135 - 145 mmol/L 141  141  139   Potassium 3.5 - 5.1 mmol/L 4.5  3.6  4.0   Chloride 98 - 111 mmol/L 107  107  102   CO2 22 - 32 mmol/L 28  26  29    Calcium 8.9 - 10.3 mg/dL 8.6  9.1  41.3   Total Protein 6.5 - 8.1 g/dL 5.8  6.5  7.3   Total Bilirubin 0.3 - 1.2 mg/dL 0.4  0.4  0.5   Alkaline Phos 38 - 126 U/L 54  48  56   AST 15 - 41 U/L 14  13  13    ALT 0 - 44 U/L 14  11  11      Adverse Effects Assessment Diarrhea: Improved Nausea: Improved  Adherence Assessment Kelli Estrada reports missing 3 doses over the past 4 weeks.   Reason for missed dose: N/A Patient was re-educated on importance of adherence.   Access Assessment Kelli Estrada is currently receiving her abemaciclib  through [facility] concerns: None  Medication Reconciliation The patient's medication list was reviewed today with the patient?  Yes New medications or herbal supplements have recently been started?  Yes, as above, Osteo Bi-Flex and vitamin D have been added Any medications have been discontinued?  No The medication list was updated and reconciled based on the patient's most recent medication list in the electronic medical record (EMR) including herbal products and OTC medications.   Medications Current Outpatient Medications  Medication Sig Dispense Refill   abemaciclib (VERZENIO) 100 MG tablet Take 1 tablet (100 mg total) by mouth 2 (two) times daily. 56 tablet 1   albuterol (VENTOLIN HFA) 108 (90 Base) MCG/ACT inhaler SMARTSIG:1 Puff(s) Via Inhaler 4 Times Daily PRN     alendronate (FOSAMAX) 70 MG tablet Take 70 mg by mouth once a week.     anastrozole (ARIMIDEX) 1 MG tablet Take 1 tablet (1 mg total) by mouth daily. 90 tablet 3   Blood Glucose Monitoring Suppl (ONETOUCH VERIO REFLECT) w/Device KIT See admin instructions.     Cholecalciferol (VITAMIN D3) 250 MCG (10000 UT) capsule Take 10,000 Units by mouth daily.     [employer] VITAMIN E 180 MG (400 UNIT) CAPS Take 2 capsules by mouth daily.     fenofibrate (TRICOR) 48 MG tablet Take 40 mg by mouth daily.     Fluticasone-Umeclidin-Vilant (TRELEGY ELLIPTA) 100-62.5-25 MCG/ACT AEPB Inhale 100 mcg into the lungs 1 day or 1 dose.     gabapentin (NEURONTIN) 300 MG capsule Take 300 mg by mouth 3 (three) times daily.     Ginger 500 MG CAPS Take by mouth.     hydrOXYzine (VISTARIL) 25 MG capsule Take 25 mg by mouth 2 (two) times daily as needed for anxiety.   0   metFORMIN (GLUCOPHAGE) 500 MG tablet SMARTSIG:1 Tablet(s) By Mouth Every Evening     mirabegron ER (MYRBETRIQ) 25 MG TB24 tablet Take 1 tablet (25 mg total) by mouth daily. 30 tablet 11   Misc Natural Products (OSTEO BI-FLEX JOINT SHIELD PO) Take by mouth daily.  omeprazole (PRILOSEC OTC) 20 MG tablet Take 20 mg by mouth daily.     Oxcarbazepine (TRILEPTAL) 300 MG tablet Take 300 mg by mouth 2 (two) times daily.     oxyCODONE-acetaminophen (PERCOCET/ROXICET) 5-325 MG tablet Take 1 tablet by mouth every 6 (six) hours as needed (For pain.).      OZEMPIC, 1 MG/DOSE, 4 MG/3ML SOPN Inject 1 mg into the skin once a week.     rosuvastatin (CRESTOR) 20 MG tablet Take 20 mg by mouth at bedtime.     sertraline (ZOLOFT) 100 MG tablet Take 100 mg by mouth 2 (two) times daily.  2   tamsulosin (FLOMAX) 0.4 MG CAPS capsule Take 1 capsule (0.4 mg total) by mouth daily. 90 capsule 2   topiramate (TOPAMAX) 100 MG tablet Take 100 mg by mouth daily.     aspirin 500 MG EC tablet Take 500 mg by mouth every 6 (six) hours as needed for pain. (Patient not taking: Reported on 04/02/2023)     loperamide (IMODIUM) 2 MG capsule Take 2 mg by mouth as needed for diarrhea or loose stools. (Patient not taking: Reported on 04/02/2023)     ondansetron (ZOFRAN) 8 MG tablet Take 1 tablet (8 mg total) by mouth every 8 (eight) hours as needed for nausea or vomiting. (Patient not taking: Reported on 04/02/2023) 30 tablet 2   prochlorperazine (COMPAZINE) 10 MG tablet Take 1 tablet (10 mg total) by mouth every 6 (six) hours as needed for nausea or vomiting. (Patient not taking: Reported on 04/02/2023) 30 tablet 2   No current facility-administered medications for this visit.    Drug-Drug Interactions (DDIs) DDIs were evaluated?  Yes Significant DDIs?  No The patient was instructed to speak with their health care provider and/or the oral chemotherapy pharmacist before starting any new drug, including prescription or over the counter, natural / herbal products, or vitamins.  Supportive Care Diarrhea: we reviewed that diarrhea is common with abemaciclib and confirmed that she does have loperamide (Imodium) at home.  We reviewed how to take this medication PRN. Neutropenia: we discussed the  importance of having a thermometer and what the Centers for Disease Control and Prevention (CDC) considers a fever which is 100.16F (38C) or higher.  Gave patient 24/7 triage line to call if any fevers or symptoms. ILD/Pneumonitis: we reviewed potential symptoms including cough, shortness, and fatigue.  VTE: reviewed signs of DVT such as leg swelling, redness, pain, or tenderness and signs of PE such as shortness of breath, rapid or irregular heartbeat, cough, chest pain, or lightheadedness. Reviewed to take the medication every 12 hours (with food sometimes can be easier on the stomach) and to take it at the same time every day. Hepatotoxicity: No LFT or bilirubin elevations at today's visit Drug interactions with grapefruit products  Dosing Assessment Hepatic adjustments needed?  No Renal adjustments needed?  No Toxicity adjustments needed?  No The current dosing regimen is appropriate to continue at this time.  Follow-Up Plan Continue abemaciclib 100 mg by mouth every 12 hours -- could possibly increase dose to 150 mg BID at next visit with Dr. Al Pimple Continue anastrozole 1 mg by mouth daily Use loperamide (Imodium) as needed for loose stool Nausea improved, diarrhea improved. Corrected calcium at 9 mg/dL today (WNL). Monitor. She will continue to be followed by her pain management doctor who she reports is also helping her with her other comorbidities.  Appreciate their recommendations Ms. Ferone says she will reach out to social services soon. She  has their information. We will add labs, pharmacy clinic visit, in 8 weeks She will see Dr. Al Pimple with labs on 05/01/23. Possibly go up on dose of abemaciclib at that time if she continues to tolerate.  Kelli Estrada participated in the discussion, expressed understanding, and voiced agreement with the above plan. All questions were answered to her satisfaction. The patient was advised to contact the clinic at [phone] with any questions or  concerns prior to her return visit.   I spent 30 minutes assessing and educating the patient.  Javonta Gronau A. Odetta Pink, PharmD, BCOP, CPP  Anselm Lis, RPH-CPP, 04/02/2023  3:30 PM   **Disclaimer: This note was dictated with voice recognition software. Similar sounding words can inadvertently be transcribed and this note may contain transcription errors which may not have been corrected upon publication of note.**

## 2023-04-02 NOTE — Progress Notes (Signed)
SURVIVORSHIP VISIT:  BRIEF ONCOLOGIC HISTORY:  Oncology History  Malignant neoplasm of central portion of left breast in female, estrogen receptor positive (HCC)  07/18/2022 Imaging   Mammogram showed indeterminate left breast mass central to the nipple in the retroareolar region, indeterminate lymph node.  Ultrasound done showed 1.9 x 1.4 x 1.5 cm taller than wide irregular mass in the left breast highly suggestive of malignancy.  No significant abnormality seen in the left axilla   08/16/2022 Pathology Results   Pathology from the left breast mass showed grade 2 invasive ductal carcinoma ER 100% positive strong staining PR 90% positive strong staining Ki-67 of 10% and HER2 1+ by [facility]   08/22/2022 Cancer Staging   Staging form: Breast, AJCC 8th Edition - Pathologic: Stage IB (pT2, pN1, cM0, G2, ER+, PR+, HER2-, Oncotype DX score: 15) - Signed by Rachel Moulds, MD on 03/05/2023 Multigene prognostic tests performed: Oncotype DX Recurrence score range: Greater than or equal to 11 Histologic grading system: 3 grade system   08/27/2022 Genetic Testing   Negative CustomNext-Cancer +RNAinsight Panel.  Report date is 08/29/2022.   The CustomNext-Cancer+RNAinsight panel offered by [employer] includes sequencing and rearrangement analysis for the following 47 genes:  APC, ATM, AXIN2, BARD1, BMPR1A, BRCA1, BRCA2, BRIP1, CDH1, CDK4, CDKN2A, CHEK2, DICER1, EPCAM, GREM1, HOXB13, MEN1, MLH1, MSH2, MSH3, MSH6, MUTYH, NBN, NF1, NF2, NTHL1, PALB2, PMS2, POLD1, POLE, PTEN, RAD51C, RAD51D, RECQL, RET, SDHA, SDHAF2, SDHB, SDHC, SDHD, SMAD4, SMARCA4, STK11, TP53, TSC1, TSC2, and VHL.  RNA data is routinely analyzed for use in variant interpretation for all genes.   09/14/2022 Surgery   Left lumpectomy, IDC, 2.1cm, g2, margins negative, 1/3 LN positive for metastases, T2n1a   10/04/2022 Oncotype testing   Oncotype DX of 15, no role for adj chemo.   10/25/2022 - 12/11/2022 Radiation Therapy   Plan Name:  Breast_L_BH Site: Breast, Left Technique: 3D Mode: Photon Dose Per Fraction: 1.8 Gy Prescribed Dose (Delivered / Prescribed): 50.4 Gy / 50.4 Gy Prescribed Fxs (Delivered / Prescribed): 28 / 28   Plan Name: Brst_L_SCV_BH Site: Breast, Left Technique: 3D Mode: Photon Dose Per Fraction: 1.8 Gy Prescribed Dose (Delivered / Prescribed): 50.4 Gy / 50.4 Gy Prescribed Fxs (Delivered / Prescribed): 28 / 28   Plan Name: Brst_L_Bst_BH Site: Breast, Left Technique: 3D Mode: Photon Dose Per Fraction: 2 Gy Prescribed Dose (Delivered / Prescribed): 10 Gy / 10 Gy Prescribed Fxs (Delivered / Prescribed): 5 / 5     12/2022 -  Anti-estrogen oral therapy   Anastrozole daily, Verzenio added in 02/2023     INTERVAL HISTORY:  Ms. Gracie to review her survivorship care plan detailing her treatment course for breast cancer, as well as monitoring long-term side effects of that treatment, education regarding health maintenance, screening, and overall wellness and health promotion.     Overall, Ms. Duchon reports feeling quite well.  She notes that she is taking Anastrozole and Verzenio.  She tolerates these well.  She has occasional nausea, but denies any other significant issues with the medication.  She is very upset today about her grand-daughter and stress at home related to her.    REVIEW OF SYSTEMS:  Review of Systems  Constitutional:  Negative for appetite change, chills, fatigue, fever and unexpected weight change.  HENT:   Negative for hearing loss, lump/mass and trouble swallowing.   Eyes:  Negative for eye problems and icterus.  Respiratory:  Negative for chest tightness, cough and shortness of breath.   Cardiovascular:  Negative for chest  pain, leg swelling and palpitations.  Gastrointestinal:  Positive for nausea. Negative for abdominal distention, abdominal pain, constipation, diarrhea and vomiting.  Endocrine: Negative for hot flashes.  Genitourinary:  Negative for difficulty urinating.    Musculoskeletal:  Negative for arthralgias.  Skin:  Negative for itching and rash.  Neurological:  Negative for dizziness, extremity weakness, headaches and numbness.  Hematological:  Negative for adenopathy. Does not bruise/bleed easily.  Psychiatric/Behavioral:  Negative for depression. The patient is nervous/anxious.    Breast: Denies any new nodularity, masses, tenderness, nipple changes, or nipple discharge.       PAST MEDICAL/SURGICAL HISTORY:  Past Medical History:  Diagnosis Date   Anxiety    Chronic pain    Depression    Family history of breast cancer 08/23/2022   H/O degenerative disc disease    History of radiation therapy    Left breast- 10/25/22-12/11/22- Dr. Antony Blackbird   History of radiation therapy    Left breast-10/25/22-12/11/22- Dr. Antony Blackbird   Pre-diabetes    PTSD (post-traumatic stress disorder)    Sleep apnea    Past Surgical History:  Procedure Laterality Date   ABDOMINAL HYSTERECTOMY     BREAST LUMPECTOMY WITH SENTINEL LYMPH NODE BIOPSY Left 09/14/2022   Procedure: LEFT BREAST CENTRAL LUMPECTOMY WITH SENTINEL LYMPH NODE BX;  Surgeon: Griselda Miner, MD;  Location: [facility];  Service: General;  Laterality: Left;   COLONOSCOPY WITH PROPOFOL N/A 03/15/2016   Procedure: COLONOSCOPY WITH PROPOFOL;  Surgeon: Dorena Cookey, MD;  Location: [facility];  Service: Endoscopy;  Laterality: N/A;   NECK SURGERY       ALLERGIES:  No Known Allergies   CURRENT MEDICATIONS:  Outpatient Encounter Medications as of 04/02/2023  Medication Sig   abemaciclib (VERZENIO) 100 MG tablet Take 1 tablet (100 mg total) by mouth 2 (two) times daily.   albuterol (VENTOLIN HFA) 108 (90 Base) MCG/ACT inhaler SMARTSIG:1 Puff(s) Via Inhaler 4 Times Daily PRN   alendronate (FOSAMAX) 70 MG tablet Take 70 mg by mouth once a week.   anastrozole (ARIMIDEX) 1 MG tablet Take 1 tablet (1 mg total) by mouth daily.   aspirin 500 MG EC tablet Take 500 mg by mouth every 6  (six) hours as needed for pain. (Patient not taking: Reported on 04/02/2023)   Blood Glucose Monitoring Suppl (ONETOUCH VERIO REFLECT) w/Device KIT See admin instructions.   [employer] VITAMIN E 180 MG (400 UNIT) CAPS Take 2 capsules by mouth daily.   fenofibrate (TRICOR) 48 MG tablet Take 40 mg by mouth daily.   Fluticasone-Umeclidin-Vilant (TRELEGY ELLIPTA) 100-62.5-25 MCG/ACT AEPB Inhale 100 mcg into the lungs 1 day or 1 dose.   gabapentin (NEURONTIN) 300 MG capsule Take 300 mg by mouth 3 (three) times daily.   Ginger 500 MG CAPS Take by mouth.   hydrOXYzine (VISTARIL) 25 MG capsule Take 25 mg by mouth 2 (two) times daily as needed for anxiety.    loperamide (IMODIUM) 2 MG capsule Take 2 mg by mouth as needed for diarrhea or loose stools. (Patient not taking: Reported on 04/02/2023)   metFORMIN (GLUCOPHAGE) 500 MG tablet SMARTSIG:1 Tablet(s) By Mouth Every Evening   mirabegron ER (MYRBETRIQ) 25 MG TB24 tablet Take 1 tablet (25 mg total) by mouth daily.   omeprazole (PRILOSEC OTC) 20 MG tablet Take 20 mg by mouth daily.   ondansetron (ZOFRAN) 8 MG tablet Take 1 tablet (8 mg total) by mouth every 8 (eight) hours as needed for nausea or vomiting. (Patient not taking: Reported  on 04/02/2023)   Oxcarbazepine (TRILEPTAL) 300 MG tablet Take 300 mg by mouth 2 (two) times daily.   oxyCODONE-acetaminophen (PERCOCET/ROXICET) 5-325 MG tablet Take 1 tablet by mouth every 6 (six) hours as needed (For pain.).    OZEMPIC, 1 MG/DOSE, 4 MG/3ML SOPN Inject 1 mg into the skin once a week.   prochlorperazine (COMPAZINE) 10 MG tablet Take 1 tablet (10 mg total) by mouth every 6 (six) hours as needed for nausea or vomiting. (Patient not taking: Reported on 04/02/2023)   rosuvastatin (CRESTOR) 20 MG tablet Take 20 mg by mouth at bedtime.   sertraline (ZOLOFT) 100 MG tablet Take 100 mg by mouth 2 (two) times daily.   tamsulosin (FLOMAX) 0.4 MG CAPS capsule Take 1 capsule (0.4 mg total) by mouth daily.   topiramate (TOPAMAX) 100  MG tablet Take 100 mg by mouth daily.   No facility-administered encounter medications on file as of 04/02/2023.     ONCOLOGIC FAMILY HISTORY:  Family History  Problem Relation Age of Onset   Lung cancer Mother        dx > 93   Breast cancer Maternal Grandmother 108     SOCIAL HISTORY:  Social History   Socioeconomic History   Marital status: Single    Spouse name: Not on file   Number of children: Not on file   Years of education: Not on file   Highest education level: Not on file  Occupational History   Not on file  Tobacco Use   Smoking status: Never   Smokeless tobacco: Never  Vaping Use   Vaping Use: Never used  Substance and Sexual Activity   Alcohol use: Not Currently    Comment: occasionally    Drug use: No   Sexual activity: Not Currently    Birth control/protection: Surgical  Other Topics Concern   Not on file  Social History Narrative   Not on file   Social Determinants of Health   Financial Resource Strain: Medium Risk (10/18/2022)   Overall Financial Resource Strain (CARDIA)    Difficulty of Paying Living Expenses: Somewhat hard  Food Insecurity: Not on file  Transportation Needs: Not on file  Physical Activity: Not on file  Stress: Not on file  Social Connections: Not on file  Intimate Partner Violence: Not on file     OBSERVATIONS/OBJECTIVE:  BP 104/61 (BP Location: Right Arm, Patient Position: Sitting)   Pulse 67   Temp 98.2 F (36.8 C) (Oral)   Ht 5' 3.78" (1.62 m)   Wt 286 lb (129.7 kg)   SpO2 98%   BMI 49.43 kg/m  GENERAL: Patient is a well appearing female in no acute distress HEENT:  Sclerae anicteric.  Oropharynx clear and moist. No ulcerations or evidence of oropharyngeal candidiasis. Neck is supple.  NODES:  No cervical, supraclavicular, or axillary lymphadenopathy palpated.  BREAST EXAM: Left breast status postlumpectomy and radiation no sign of local recurrence right breast is benign. LUNGS:  Clear to auscultation  bilaterally.  No wheezes or rhonchi. HEART:  Regular rate and rhythm. No murmur appreciated. ABDOMEN:  Soft, nontender.  Positive, normoactive bowel sounds. No organomegaly palpated. MSK:  No focal spinal tenderness to palpation. Full range of motion bilaterally in the upper extremities. EXTREMITIES:  No peripheral edema.   SKIN:  Clear with no obvious rashes or skin changes. No nail dyscrasia. NEURO:  Nonfocal. Well oriented.  Appropriate affect.   LABORATORY DATA:  None for this visit.  DIAGNOSTIC IMAGING:  None for this visit.  ASSESSMENT AND PLAN:  Ms.. Estrada is a pleasant 62 y.o. female with Stage IB left breast invasive ductal carcinoma, ER+/PR+/HER2-, diagnosed in 08/2022, treated with lumpectomy, adjuvant radiation therapy, and anti-estrogen therapy with anastrozole beginning in March 2024 followed by [name] beginning in May 2024. She presents to the Survivorship Clinic for our initial meeting and routine follow-up post-completion of treatment for breast cancer.    1. Stage 1B left breast cancer:  Kelli Estrada is continuing to recover from definitive treatment for breast cancer. She will follow-up with her medical oncologist, Dr. Orland Dec in 4 weeks with history and physical exam per surveillance protocol.  She will continue with anastrozole and Verzenio. Her mammogram is due 07/2023; orders placed today.   Today, a comprehensive survivorship care plan and treatment summary was reviewed with the patient today detailing her breast cancer diagnosis, treatment course, potential late/long-term effects of treatment, appropriate follow-up care with recommendations for the future, and patient education resources.  A copy of this summary, along with a letter will be sent to the patient's primary care provider via mail/fax/In Basket message after today's visit.    2. Bone health: This was completed at [facility].  My nurse is reaching out to their office to get a copy of her results.   She was given education on specific activities to promote bone health.  3. Cancer screening:  Due to Ms. Delpriore's history and her age, she should receive screening for skin cancers, colon cancer, and gynecologic cancers.  The information and recommendations are listed on the patient's comprehensive care plan/treatment summary and were reviewed in detail with the patient.    4. Health maintenance and wellness promotion: Ms. Pivirotto was encouraged to consume 5-7 servings of fruits and vegetables per day. We reviewed the "Nutrition Rainbow" handout.  She was also encouraged to engage in moderate to vigorous exercise for 30 minutes per day most days of the week.  She was instructed to limit her alcohol consumption and continue to abstain from tobacco use.     5. Support services/counseling: It is not uncommon for this period of the patient's cancer care trajectory to be one of many emotions and stressors.   She was given information regarding our available services and encouraged to contact me with any questions or for help enrolling in any of our support group/programs.    Follow up instructions:    -Return to cancer center in 4 weeks for follow-up with Dr. Al Pimple -Mammogram due in October 2024. -She is welcome to return back to the Survivorship Clinic at any time; no additional follow-up needed at this time.  -Consider referral back to survivorship as a long-term survivor for continued surveillance  The patient was provided an opportunity to ask questions and all were answered. The patient agreed with the plan and demonstrated an understanding of the instructions.   Total encounter time:45 minutes*in face-to-face visit time, chart review, lab review, care coordination, order entry, and documentation of the encounter time.    Lillard Anes, NP 04/04/23 1:13 PM Medical Oncology and Hematology [facility] [address] [place], [place] [address] Tel. [phone]    Fax.  [phone]  *Total Encounter Time as defined by the Centers for Medicare and Medicaid Services includes, in addition to the face-to-face time of a patient visit (documented in the note above) non-face-to-face time: obtaining and reviewing outside history, ordering and reviewing medications, tests or procedures, care coordination (communications with other health care professionals or caregivers) and documentation in  the medical record.

## 2023-04-03 ENCOUNTER — Other Ambulatory Visit: Payer: Self-pay | Admitting: Hematology and Oncology

## 2023-04-03 ENCOUNTER — Other Ambulatory Visit: Payer: Self-pay

## 2023-04-03 ENCOUNTER — Encounter: Payer: Self-pay | Admitting: Hematology and Oncology

## 2023-04-04 ENCOUNTER — Other Ambulatory Visit (HOSPITAL_COMMUNITY): Payer: Self-pay

## 2023-04-04 ENCOUNTER — Other Ambulatory Visit: Payer: Self-pay | Admitting: *Deleted

## 2023-04-04 MED ORDER — ABEMACICLIB 100 MG PO TABS
100.0000 mg | ORAL_TABLET | Freq: Two times a day (BID) | ORAL | 1 refills | Status: DC
  Filled 2023-04-04: qty 56, 28d supply, fill #0
  Filled 2023-05-01: qty 56, 28d supply, fill #1

## 2023-04-05 ENCOUNTER — Other Ambulatory Visit: Payer: Self-pay

## 2023-04-05 ENCOUNTER — Ambulatory Visit: Payer: 59 | Admitting: Rehabilitation

## 2023-04-06 ENCOUNTER — Telehealth: Payer: Self-pay | Admitting: Pharmacist

## 2023-04-06 NOTE — Telephone Encounter (Signed)
Scheduled appointments per 6/18 los.Left voicemail for patient.

## 2023-04-08 ENCOUNTER — Ambulatory Visit: Payer: 59

## 2023-04-08 ENCOUNTER — Other Ambulatory Visit (HOSPITAL_COMMUNITY): Payer: Self-pay

## 2023-04-08 DIAGNOSIS — I890 Lymphedema, not elsewhere classified: Secondary | ICD-10-CM

## 2023-04-08 DIAGNOSIS — Z170 Estrogen receptor positive status [ER+]: Secondary | ICD-10-CM

## 2023-04-08 DIAGNOSIS — R293 Abnormal posture: Secondary | ICD-10-CM

## 2023-04-08 DIAGNOSIS — C50112 Malignant neoplasm of central portion of left female breast: Secondary | ICD-10-CM

## 2023-04-08 DIAGNOSIS — L599 Disorder of the skin and subcutaneous tissue related to radiation, unspecified: Secondary | ICD-10-CM

## 2023-04-08 NOTE — Therapy (Signed)
OUTPATIENT PHYSICAL THERAPY BREAST CANCER TREATMENT   Patient Name: Kelli Estrada MRN: [id number] DOB:October 24, 1960, 62 y.o., female Today's Date: 04/08/2023   PT End of Session - 04/08/23 1207     Visit Number 11    Number of Visits 16    Date for PT Re-Evaluation 04/22/23    PT Start Time 1205    PT Stop Time 1302    PT Time Calculation (min) 57 min    Activity Tolerance Patient tolerated treatment well    Behavior During Therapy [facility] for tasks assessed/performed                Past Medical History:  Diagnosis Date   Anxiety    Chronic pain    Depression    Family history of breast cancer 08/23/2022   H/O degenerative disc disease    History of radiation therapy    Left breast- 10/25/22-12/11/22- Dr. Antony Blackbird   History of radiation therapy    Left breast-10/25/22-12/11/22- Dr. Antony Blackbird   Pre-diabetes    PTSD (post-traumatic stress disorder)    Sleep apnea    Past Surgical History:  Procedure Laterality Date   ABDOMINAL HYSTERECTOMY     BREAST LUMPECTOMY WITH SENTINEL LYMPH NODE BIOPSY Left 09/14/2022   Procedure: LEFT BREAST CENTRAL LUMPECTOMY WITH SENTINEL LYMPH NODE BX;  Surgeon: Griselda Miner, MD;  Location: [facility];  Service: General;  Laterality: Left;   COLONOSCOPY WITH PROPOFOL N/A 03/15/2016   Procedure: COLONOSCOPY WITH PROPOFOL;  Surgeon: Dorena Cookey, MD;  Location: [facility];  Service: Endoscopy;  Laterality: N/A;   NECK SURGERY     Patient Active Problem List   Diagnosis Date Noted   Genetic testing 08/27/2022   Family history of breast cancer 08/23/2022   Malignant neoplasm of central portion of left breast in female, estrogen receptor positive (HCC) 08/20/2022   Chronic pain of left knee 06/03/2015   Depression, major, recurrent, moderate (HCC) 06/03/2015   Morbid obesity with body mass index of 50 or higher (HCC) 06/03/2015   Vitamin D deficiency 06/03/2015   PTSD (post-traumatic stress disorder) 06/03/2015    Obstructive sleep apnea 06/03/2015    REFERRING PROVIDER: Erven Colla, PA-C  REFERRING DIAG:C50.112,Z17.0 (ICD-10-CM) - Malignant neoplasm of central portion of left breast in female, estrogen receptor positive (HCC)  THERAPY DIAG:  Lymphedema, not elsewhere classified  Disorder of the skin and subcutaneous tissue related to radiation, unspecified  Abnormal posture  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 So my Lt shoulder started really bothering me over the weekend and still today.   PERTINENT HISTORY:  Patient was diagnosed on 07/18/2022 with left grade 2 invasive ductal carcinoma breast cancer. It measures 1.9 cm and is located in the central quadrant. It is ER/PR positive and HER2 negative with a Ki67 of 10%. 09/14/22 L breast lumpectomy and SLNB 1/3. Completed radiation. Hx of chronic pain, degenerative disc disease, PTSD  PATIENT GOALS:   to decrease the pain under the arm and on the trunk  PAIN:  Are you having pain? Yes: NPRS scale: 8/10 Pain location: Lt shoulder  Pain description: achy, sharp and stabbing Aggravating factors: was stressed a lot last week Relieving factors: lying supine   PRECAUTIONS: at risk for lymphedema Fall, DDD  HAND DOMINANCE: right  WEIGHT BEARING RESTRICTIONS: No  FALLS:  Has patient fallen in last 6 months? No  LIVING ENVIRONMENT: Patient lives with: her daughter Lives in: House/apartment Has following equipment at home: Single point cane  OCCUPATION: On disability  LEISURE: Walks  some in her yard  PRIOR LEVEL OF FUNCTION: Independent   OBJECTIVE:  COGNITION: Overall cognitive status: Within functional limits for tasks assessed    POSTURE:  Forward head and rounded shoulders posture  UPPER EXTREMITY AROM/PROM:  A/PROM RIGHT   08/22/22   Shoulder extension 40  Shoulder flexion 146  Shoulder abduction 149  Shoulder internal rotation 67  Shoulder external rotation 77    (Blank rows = not tested)  A/PROM LEFT   08/22/22 LEFT  02/20/23 LEFT 03/13/23  Shoulder extension 32 44 48  Shoulder flexion 139 155 158  Shoulder abduction 152 148 168  Shoulder internal rotation 60 52 61  Shoulder external rotation 75 88     (Blank rows = not tested)  UPPER EXTREMITY STRENGTH: WFL  LYMPHEDEMA ASSESSMENTS:   LANDMARK RIGHT   08/22/22 RIGHT 02/20/23  10 cm proximal to olecranon process 40 40  Olecranon process 27.5 27.3  10 cm proximal to ulnar styloid process 25.2 23.8  Just proximal to ulnar styloid process 15.8 16  Across hand at thumb web space 17.9 18  At base of 2nd digit 5.9 5.9  (Blank rows = not tested)  LANDMARK LEFT   08/22/22 LEFT 02/20/23  10 cm proximal to olecranon process 40.2 37  Olecranon process 26.5 27.4  10 cm proximal to ulnar styloid process 25.1 24.7  Just proximal to ulnar styloid process 16.3 15.9  Across hand at thumb web space 18.3 18.4  At base of 2nd digit 6.5 6.1  (Blank rows = not tested)  L-DEX LYMPHEDEMA SCREENING: The patient was assessed using the L-Dex machine today to produce a lymphedema index baseline score. The patient will be reassessed on a regular basis (typically every 3 months) to obtain new L-Dex scores. If the score is > 6.5 points away from his/her baseline score indicating onset of subclinical lymphedema, it will be recommended to wear a compression garment for 4 weeks, 12 hours per day and then be reassessed. If the score continues to be > 6.5 points from baseline at reassessment, we will initiate lymphedema  treatment. Assessing in this manner has a 95% rate of preventing clinically significant lymphedema.  LLIS:  EVAL: 33.82   03/25/23: 45%   QDASH: EVAL: 36%   03/25/23: 50%  TODAY'S DATE: 04/08/23: Therapeutic Exercises Pulleys into flexion and  abd x 2 mins each with VC's to decrease Lt scapular compensation throughout as pt reported feeling tighter today with increased pain Roll yellow ball into Lt flex x 15 and abd x 10  Supine over foam roll: Bil horz abd x 7, bil UE scaption "V" x 10, bil abd into a "snow angel" x 5 with less A/ROM but first time of full foam roll Manual Therapy MLD to Lt breast in supine as follows: Short neck, 5 diaphragmatic breaths, Lt inguinal and Rt axillary nodes, Lt axillo-inguinal and anterior inter-axillary anastomosis, then focused on Lt breast redirecting towards pathways then retracing all steps.  Scar tissue mobs to Lt breast incision where pt reports tightness and at axilla where pt reports increased tenderness and scar tissue is firm here; applied cocoa butter to Lt breast after session due to increased dry skin STM to Lt UT and at scar tissue inferior to axilla.  P/ROM to Lt shoulder for scar tissue mobs, into flex and abd, with scapular depression throughout  04/01/23: Therapeutic Exercises Pulleys into flexion and abd x 2 mins each with VC's to decrease Lt scapular compensation Roll yellow ball into Lt abd and flex x 10 each Supine over half foam roll for bil UE horz abd, then bil UE scaption into a "V" and then bil abd into a "snow angel" x 5 each with 5 sec holds Standing with back against wall for bil UE 3 way raises to 2# x 10 each direction except for abd with 1# Manual Therapy MLD to Lt breast in supine as follows: Short neck, 5 diaphragmatic breaths, Lt inguinal and Rt axillary nodes, Lt axillo-inguinal and anterior inter-axillary anastomosis, then focused on Lt breast redirecting towards pathways then retracing all steps.  Scar tissue mobs to Lt  breast incision where pt reports tightness and at axilla where pt reports increased tenderness and scar tissue is firm here P/ROM to Lt shoulder for scar tissue mobs, into flex and abd, with scapular depression throughout  03/28/23: Therapeutic Exercises Pulleys into flexion and abd x 2 mins each with VC's to decrease Lt scapular compensation Roll yellow ball into Lt abd and flex x 10 each Supine over half foam roll for bil UE horz abd, then bil UE scaption into a "V" and then bil abd into a "snow angel" x 5 each with 5 sec holds Manual Therapy MLD to Lt breast in supine as follows: Short neck, 5 diaphragmatic breaths, Lt inguinal and Rt axillary nodes, Lt axillo-inguinal and anterior inter-axillary anastomosis, then focused on Lt breast redirecting towards pathways then retracing all steps.  Scar tissue mobs to Lt breast incision where pt reports tightness  03/25/23: Reassessed goals and status for renewal  - extended POC Therapeutic Exercises Pulleys into flexion and abd x 2 mins each returning therapist demo and VC's to decrease Lt scapular compensation Roll ball up wall into flexion and Lt abd x 10 each returning therapist demo Supine Scapular Series with yellow theraband returning therapist demo x 10 each: narrow and wide grip flexion, ER, diagonals, horizontal abduction Manual Therapy MLD to Lt breast in supine as follows: Short neck, 5 diaphragmatic breaths, Lt inguinal and Rt axillary nodes, Lt axillo-inguinal and anterior inter-axillary anastomosis, then focused on Lt breast redirecting towards pathways then retracing all steps.     PATIENT EDUCATION:  Education details: Supine scapular series with yellow theraband Person educated: Patient Education method: Explanation, Demonstration, tactile and VC's and handout issued Education comprehension: Patient verbalized understanding, returned demo and will  benefit from further review  HOME EXERCISE PROGRAM: Wear compression bra as much  as possible Contact 2nd to Nature to obtain another compression bra Seated and supine AA/ROM Lt UE dowel exercises and Self Lt breast MLD Supine scapular series   ASSESSMENT:  CLINICAL IMPRESSION: Pt came in reporting increased Lt lateral trunk and UT pain from feeling stressed last week so focused manual therapy here in addition to continuing with breast MLD and Lt shoulder P/ROM. Overall pt reports even though she has increased pain today she thinks her A/ROM of her Lt shoulder has improved since start of care.   Pt will benefit from skilled therapeutic intervention to improve on the following deficits: Decreased knowledge of precautions, pain, decreased ROM, postural dysfunction, decreased knowledge of condition, increased edema  PT treatment/interventions: ADL/Self care home management, Therapeutic exercises, Therapeutic activity, Patient/Family education, Self Care, Orthotic/Fit training, Manual lymph drainage, Compression bandaging, scar mobilization, Taping, Vasopneumatic device, Manual therapy, and Re-evaluation   REHAB POTENTIAL: Good  CLINICAL DECISION MAKING: Stable/uncomplicated  EVALUATION COMPLEXITY: Low   GOALS: Goals reviewed with patient? YES  LONG TERM GOALS: (STG=LTG)    Name Target Date Goal status  1 Pt will be independent in self MLD for long term management of L breast lymphedema.    03/20/2023 MET  2 Pt will obtain appropriate compression bra for long term management of lymphedema. 03/20/23 MET    3 Pt will report a 75% decrease in pain in her L breast and axilla to allow improved comfort.   04/22/23 ONGOING  4 Pt will be independent in a home exercise program for continued stretching and strengthening.   04/22/23 ONGOING    PLAN:  PT FREQUENCY/DURATION: 2x/wk for 3 wks  PLAN FOR NEXT SESSION: Cont A/AA/ROM of Lt shoulder; Review bil UE 3 way raises; Cont MLD to L breast and review, cont STM to L axilla/MFR and cont/review AA/ROM exs and progress HEP  prn.    Berna Spare, PTA 04/08/23 1:17 PM

## 2023-04-11 ENCOUNTER — Other Ambulatory Visit (HOSPITAL_COMMUNITY): Payer: Self-pay

## 2023-04-11 ENCOUNTER — Ambulatory Visit: Payer: 59

## 2023-04-11 DIAGNOSIS — I890 Lymphedema, not elsewhere classified: Secondary | ICD-10-CM

## 2023-04-11 DIAGNOSIS — L599 Disorder of the skin and subcutaneous tissue related to radiation, unspecified: Secondary | ICD-10-CM

## 2023-04-11 DIAGNOSIS — Z170 Estrogen receptor positive status [ER+]: Secondary | ICD-10-CM

## 2023-04-11 DIAGNOSIS — R293 Abnormal posture: Secondary | ICD-10-CM

## 2023-04-11 NOTE — Therapy (Signed)
OUTPATIENT PHYSICAL THERAPY BREAST CANCER TREATMENT   Patient Name: Kelli Estrada MRN: [id number] DOB:[birth year], 62 y.o., female Today's Date: 04/11/2023   PT End of Session - 04/11/23 1110     Visit Number 12    Number of Visits 16    Date for PT Re-Evaluation 04/22/23    PT Start Time 1107    PT Stop Time 1202    PT Time Calculation (min) 55 min    Activity Tolerance Patient tolerated treatment well    Behavior During Therapy [facility] for tasks assessed/performed                Past Medical History:  Diagnosis Date   Anxiety    Chronic pain    Depression    Family history of breast cancer 08/23/2022   H/O degenerative disc disease    History of radiation therapy    Left breast- 10/25/22-12/11/22- Dr. Antony Blackbird   History of radiation therapy    Left breast-10/25/22-12/11/22- Dr. Antony Blackbird   Pre-diabetes    PTSD (post-traumatic stress disorder)    Sleep apnea    Past Surgical History:  Procedure Laterality Date   ABDOMINAL HYSTERECTOMY     BREAST LUMPECTOMY WITH SENTINEL LYMPH NODE BIOPSY Left 09/14/2022   Procedure: LEFT BREAST CENTRAL LUMPECTOMY WITH SENTINEL LYMPH NODE BX;  Surgeon: Griselda Miner, MD;  Location: [facility];  Service: General;  Laterality: Left;   COLONOSCOPY WITH PROPOFOL N/A 03/15/2016   Procedure: COLONOSCOPY WITH PROPOFOL;  Surgeon: Dorena Cookey, MD;  Location: [facility];  Service: Endoscopy;  Laterality: N/A;   NECK SURGERY     Patient Active Problem List   Diagnosis Date Noted   Genetic testing 08/27/2022   Family history of breast cancer 08/23/2022   Malignant neoplasm of central portion of left breast in female, estrogen receptor positive (HCC) 08/20/2022   Chronic pain of left knee 06/03/2015   Depression, major, recurrent, moderate (HCC) 06/03/2015   Morbid obesity with body mass index of 50 or higher (HCC) 06/03/2015   Vitamin D deficiency 06/03/2015   PTSD (post-traumatic stress disorder) 06/03/2015    Obstructive sleep apnea 06/03/2015    REFERRING PROVIDER: Erven Colla, PA-C  REFERRING DIAG:C50.112,Z17.0 (ICD-10-CM) - Malignant neoplasm of central portion of left breast in female, estrogen receptor positive (HCC)  THERAPY DIAG:  Lymphedema, not elsewhere classified  Disorder of the skin and subcutaneous tissue related to radiation, unspecified  Abnormal posture  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 I've been working to keep myself off my shoulder when I sleep and I think that's been helping a lot as well.   PERTINENT HISTORY:  Patient was diagnosed on 07/18/2022 with left grade 2 invasive ductal carcinoma breast cancer. It measures 1.9 cm and is located in the central quadrant. It is ER/PR positive and HER2 negative with a Ki67 of 10%. 09/14/22 L breast lumpectomy and SLNB 1/3. Completed radiation. Hx of chronic pain, degenerative disc disease, PTSD  PATIENT GOALS:   to decrease the pain under the arm and on the trunk  PAIN:  Are you having pain? Yes: NPRS scale: 5/10 Pain location: Lt shoulder  Pain description: achy Aggravating factors: laying on Lt side Relieving factors: lying supine   PRECAUTIONS: at risk for lymphedema Fall, DDD  HAND DOMINANCE: right  WEIGHT BEARING RESTRICTIONS: No  FALLS:  Has patient fallen in last 6 months? No  LIVING ENVIRONMENT: Patient lives with: her daughter Lives in: House/apartment Has following equipment at home: Single point cane  OCCUPATION: On disability  LEISURE: Walks some in her yard  PRIOR LEVEL OF FUNCTION:  Independent   OBJECTIVE:  COGNITION: Overall cognitive status: Within functional limits for tasks assessed    POSTURE:  Forward head and rounded shoulders posture  UPPER EXTREMITY AROM/PROM:  A/PROM RIGHT   08/22/22   Shoulder extension 40  Shoulder flexion 146  Shoulder abduction 149  Shoulder internal rotation 67  Shoulder external rotation 77    (Blank rows = not tested)  A/PROM LEFT   08/22/22 LEFT  02/20/23 LEFT 03/13/23  Shoulder extension 32 44 48  Shoulder flexion 139 155 158  Shoulder abduction 152 148 168  Shoulder internal rotation 60 52 61  Shoulder external rotation 75 88     (Blank rows = not tested)  UPPER EXTREMITY STRENGTH: WFL  LYMPHEDEMA ASSESSMENTS:   LANDMARK RIGHT   08/22/22 RIGHT 02/20/23  10 cm proximal to olecranon process 40 40  Olecranon process 27.5 27.3  10 cm proximal to ulnar styloid process 25.2 23.8  Just proximal to ulnar styloid process 15.8 16  Across hand at thumb web space 17.9 18  At base of 2nd digit 5.9 5.9  (Blank rows = not tested)  LANDMARK LEFT   08/22/22 LEFT 02/20/23  10 cm proximal to olecranon process 40.2 37  Olecranon process 26.5 27.4  10 cm proximal to ulnar styloid process 25.1 24.7  Just proximal to ulnar styloid process 16.3 15.9  Across hand at thumb web space 18.3 18.4  At base of 2nd digit 6.5 6.1  (Blank rows = not tested)  L-DEX LYMPHEDEMA SCREENING: The patient was assessed using the L-Dex machine today to produce a lymphedema index baseline score. The patient will be reassessed on a regular basis (typically every 3 months) to obtain new L-Dex scores. If the score is > 6.5 points away from his/her baseline score indicating onset of subclinical lymphedema, it will be recommended to wear a compression garment for 4 weeks, 12 hours per day and then be reassessed. If the score continues to be > 6.5 points from baseline at reassessment, we will initiate lymphedema treatment. Assessing in this manner has a 95%  rate of preventing clinically significant lymphedema.  LLIS:  EVAL: 33.82   03/25/23: 45%   QDASH: EVAL: 36%   03/25/23: 50%  TODAY'S DATE: 6/27//24: Therapeutic Exercises Pulleys flex and abd x 2 mins each Doorway pectoralis stretch  5x, 5 sec holds; then 2x 20-30 sec holds encouraging pt to hold this longer when doing at home.  Supine over full foam roll for following: Bil horz abd 2 x 5; then bil UE scaption into a "V" with VC's during for correct UE positioning throughout; then bil abd into a "snow angel" x 10 with 5 sec holds encouraging pt not to push into pain; then alt UE flexion with core engaged (for stability) and pt with improved A/ROM with this Manual Therapy MLD to Lt breast in supine as follows: Short neck, 5 diaphragmatic breaths, Lt inguinal and Rt axillary nodes, Lt axillo-inguinal and anterior inter-axillary anastomosis, then focused on Lt breast redirecting towards pathways then retracing all steps.  Scar tissue mobs to Lt breast incision where pt reports tightness and at axilla where scar tissue is firm here STM to Lt scar tissue inferior to axilla.  P/ROM to Lt shoulder into flex, abd, and D2 with scapular depression throughout  04/08/23: Therapeutic Exercises Pulleys into flexion and abd x 2 mins each with VC's to decrease Lt scapular compensation throughout as pt reported feeling tighter today with increased pain Roll yellow ball into Lt flex x 15 and abd x 10  Supine over foam roll: Bil horz abd x 7, bil UE scaption "V" x 10, bil abd into a "snow angel" x 5 with less A/ROM but first time of full foam roll Manual Therapy MLD to Lt breast in supine as follows: Short neck, 5 diaphragmatic breaths, Lt inguinal and Rt axillary nodes, Lt axillo-inguinal and anterior inter-axillary anastomosis, then focused on Lt breast redirecting towards pathways then retracing all steps.  Scar tissue mobs to Lt breast incision where pt reports tightness and at axilla where pt reports  increased tenderness and scar tissue is firm here; applied cocoa butter to Lt breast after session due to increased dry skin STM to Lt UT and at scar tissue inferior to axilla.  P/ROM to Lt shoulder for scar tissue mobs, into flex and abd, with scapular depression throughout  04/01/23: Therapeutic Exercises Pulleys into flexion and abd x 2 mins each with VC's to decrease Lt scapular compensation Roll yellow ball into Lt abd and flex x 10 each Supine over half foam roll for bil UE horz abd, then bil UE scaption into a "V" and then bil abd into a "snow angel" x 5 each with 5 sec holds Standing with back against wall for bil UE 3 way raises to 2# x 10 each direction except for abd with 1# Manual Therapy MLD to Lt breast in supine as follows: Short neck, 5 diaphragmatic breaths, Lt inguinal and Rt axillary nodes, Lt axillo-inguinal and anterior inter-axillary anastomosis, then focused on Lt breast redirecting towards pathways then retracing all steps.  Scar tissue mobs to Lt breast incision where pt reports tightness and at axilla where pt reports increased tenderness and scar tissue is firm here P/ROM to Lt shoulder for scar tissue mobs, into flex and abd, with scapular depression throughout     PATIENT EDUCATION:  Education details: Supine scapular series with yellow theraband Person educated: Patient Education method: Explanation, Demonstration, tactile and VC's and handout issued Education comprehension: Patient verbalized understanding, returned demo and will benefit from further review  HOME EXERCISE PROGRAM: Wear compression bra as much as possible Contact 2nd to Nature to obtain another compression bra Seated and supine AA/ROM Lt UE dowel exercises and Self Lt breast MLD Supine scapular series   ASSESSMENT:  CLINICAL IMPRESSION: Added doorway  pectoralis stretch a new exercise on foam roll which pt did well with no increased pain reported and demonstrated good pain free A/ROM.  Then continued with manual therapy to decrease Lt UE tightness and breast lymphedema. Her Lt shoulder P/ROM is near full now with no pt reported discomfort. She did have shooting pains into breast 1x during session that seemed to be nerve regeneration type pains per pt description.   Pt will benefit from skilled therapeutic intervention to improve on the following deficits: Decreased knowledge of precautions, pain, decreased ROM, postural dysfunction, decreased knowledge of condition, increased edema  PT treatment/interventions: ADL/Self care home management, Therapeutic exercises, Therapeutic activity, Patient/Family education, Self Care, Orthotic/Fit training, Manual lymph drainage, Compression bandaging, scar mobilization, Taping, Vasopneumatic device, Manual therapy, and Re-evaluation   REHAB POTENTIAL: Good  CLINICAL DECISION MAKING: Stable/uncomplicated  EVALUATION COMPLEXITY: Low   GOALS: Goals reviewed with patient? YES  LONG TERM GOALS: (STG=LTG)    Name Target Date Goal status  1 Pt will be independent in self MLD for long term management of L breast lymphedema.    03/20/2023 MET  2 Pt will obtain appropriate compression bra for long term management of lymphedema. 03/20/23 MET    3 Pt will report a 75% decrease in pain in her L breast and axilla to allow improved comfort.   04/22/23 ONGOING  4 Pt will be independent in a home exercise program for continued stretching and strengthening.   04/22/23 ONGOING    PLAN:  PT FREQUENCY/DURATION: 2x/wk for 3 wks  PLAN FOR NEXT SESSION: Cont A/AA/ROM of Lt shoulder; Review bil UE 3 way raises; Cont MLD to L breast and review, cont STM to L axilla/MFR and cont/review AA/ROM exs and progress HEP prn.    Berna Spare, PTA 04/11/23 12:12 PM

## 2023-04-15 ENCOUNTER — Ambulatory Visit: Payer: 59

## 2023-04-19 ENCOUNTER — Ambulatory Visit: Payer: 59 | Attending: Radiology

## 2023-04-19 DIAGNOSIS — L599 Disorder of the skin and subcutaneous tissue related to radiation, unspecified: Secondary | ICD-10-CM | POA: Diagnosis present

## 2023-04-19 DIAGNOSIS — R293 Abnormal posture: Secondary | ICD-10-CM | POA: Diagnosis present

## 2023-04-19 DIAGNOSIS — I890 Lymphedema, not elsewhere classified: Secondary | ICD-10-CM | POA: Insufficient documentation

## 2023-04-19 DIAGNOSIS — C50112 Malignant neoplasm of central portion of left female breast: Secondary | ICD-10-CM | POA: Diagnosis present

## 2023-04-19 DIAGNOSIS — Z170 Estrogen receptor positive status [ER+]: Secondary | ICD-10-CM | POA: Insufficient documentation

## 2023-04-19 NOTE — Therapy (Signed)
OUTPATIENT PHYSICAL THERAPY BREAST CANCER TREATMENT   Patient Name: Kelli Estrada MRN: [id number] DOB:24-Oct-1960, 62 y.o., female Today's Date: 04/19/2023   PT End of Session - 04/19/23 1007     Visit Number 13    Number of Visits 16    Date for PT Re-Evaluation 04/22/23    PT Start Time 1004    PT Stop Time 1102    PT Time Calculation (min) 58 min    Activity Tolerance Patient tolerated treatment well    Behavior During Therapy [facility] for tasks assessed/performed                Past Medical History:  Diagnosis Date   Anxiety    Chronic pain    Depression    Family history of breast cancer 08/23/2022   H/O degenerative disc disease    History of radiation therapy    Left breast- 10/25/22-12/11/22- Dr. Antony Blackbird   History of radiation therapy    Left breast-10/25/22-12/11/22- Dr. Antony Blackbird   Pre-diabetes    PTSD (post-traumatic stress disorder)    Sleep apnea    Past Surgical History:  Procedure Laterality Date   ABDOMINAL HYSTERECTOMY     BREAST LUMPECTOMY WITH SENTINEL LYMPH NODE BIOPSY Left 09/14/2022   Procedure: LEFT BREAST CENTRAL LUMPECTOMY WITH SENTINEL LYMPH NODE BX;  Surgeon: Griselda Miner, MD;  Location: [facility];  Service: General;  Laterality: Left;   COLONOSCOPY WITH PROPOFOL N/A 03/15/2016   Procedure: COLONOSCOPY WITH PROPOFOL;  Surgeon: Dorena Cookey, MD;  Location: [facility];  Service: Endoscopy;  Laterality: N/A;   NECK SURGERY     Patient Active Problem List   Diagnosis Date Noted   Genetic testing 08/27/2022   Family history of breast cancer 08/23/2022   Malignant neoplasm of central portion of left breast in female, estrogen receptor positive (HCC) 08/20/2022   Chronic pain of left knee 06/03/2015   Depression, major, recurrent, moderate (HCC) 06/03/2015   Morbid obesity with body mass index of 50 or higher (HCC) 06/03/2015   Vitamin D deficiency 06/03/2015   PTSD (post-traumatic stress disorder) 06/03/2015   Obstructive  sleep apnea 06/03/2015    REFERRING PROVIDER: Erven Colla, PA-C  REFERRING DIAG:C50.112,Z17.0 (ICD-10-CM) - Malignant neoplasm of central portion of left breast in female, estrogen receptor positive (HCC)  THERAPY DIAG:  Lymphedema, not elsewhere classified  Disorder of the skin and subcutaneous tissue related to radiation, unspecified  Abnormal posture  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 They are going to do my Rt eye 05/03/23. I haven't noticed my shoulder bothering me the past few days with focusing on my eye healing. I can even lay on my Lt side some now too!  PERTINENT HISTORY:  Patient was diagnosed on 07/18/2022 with left grade 2 invasive ductal carcinoma breast cancer. It measures 1.9 cm and is located in the central quadrant. It is ER/PR positive and HER2 negative with a Ki67 of 10%. 09/14/22 L breast lumpectomy and SLNB 1/3. Completed radiation. Hx of chronic pain, degenerative disc disease, PTSD  PATIENT GOALS:   to decrease the pain under the arm and on the trunk  PAIN:  Are you having pain? Mostly just feels sore, 3/10 soreness in shoulder. Feels better today  PRECAUTIONS: at risk for lymphedema Fall, DDD  HAND DOMINANCE: right  WEIGHT BEARING RESTRICTIONS: No  FALLS:  Has patient fallen in last 6 months? No  LIVING ENVIRONMENT: Patient lives with: her daughter Lives in: House/apartment Has following equipment at home: Single point cane  OCCUPATION: On disability  LEISURE: Walks some in her  yard  PRIOR LEVEL OF FUNCTION: Independent   OBJECTIVE:  COGNITION: Overall cognitive status: Within functional limits for tasks assessed    POSTURE:  Forward head and rounded shoulders posture  UPPER EXTREMITY AROM/PROM:  A/PROM RIGHT   08/22/22   Shoulder extension 40  Shoulder flexion 146  Shoulder abduction 149  Shoulder internal rotation 67  Shoulder external rotation 77    (Blank rows = not tested)  A/PROM LEFT   08/22/22 LEFT  02/20/23 LEFT 03/13/23 LEFT 04/23/23  Shoulder extension 32 44 48   Shoulder flexion 139 155 158   Shoulder abduction 152 148 168   Shoulder internal rotation 60 52 61   Shoulder external rotation 75 88      (Blank rows = not tested)  UPPER EXTREMITY STRENGTH: WFL  LYMPHEDEMA ASSESSMENTS:   LANDMARK RIGHT   08/22/22 RIGHT 02/20/23  10 cm proximal to olecranon process 40 40  Olecranon process 27.5 27.3  10 cm proximal to ulnar styloid process 25.2 23.8  Just proximal to ulnar styloid process 15.8 16  Across hand at thumb web space 17.9 18  At base of 2nd digit 5.9 5.9  (Blank rows = not tested)  LANDMARK LEFT   08/22/22 LEFT 02/20/23  10 cm proximal to olecranon process 40.2 37  Olecranon process 26.5 27.4  10 cm proximal to ulnar styloid process 25.1 24.7  Just proximal to ulnar styloid process 16.3 15.9  Across hand at thumb web space 18.3 18.4  At base of 2nd digit 6.5 6.1  (Blank rows = not tested)  L-DEX LYMPHEDEMA SCREENING: The patient was assessed using the L-Dex machine today to produce a lymphedema index baseline score. The patient will be reassessed on a regular basis (typically every 3 months) to obtain new L-Dex scores. If the score is > 6.5 points away from his/her baseline score indicating onset of subclinical lymphedema, it will be recommended to wear a compression garment for 4 weeks, 12 hours per day and then be reassessed. If the score continues to be > 6.5 points from baseline at reassessment, we will initiate  lymphedema treatment. Assessing in this manner has a 95% rate of preventing clinically significant lymphedema.  LLIS:  EVAL: 33.82   03/25/23: 45%  QDASH: EVAL: 36%   03/25/23: 50%  TODAY'S DATE: 04/19/23: Therapeutic Exercises Pulleys into flexion x 2 mins , then abd x 3 mins Bil UE 3 way raises standing with back against wall/core engaged, and shoulders and head against wall: Flexion and scaption 2# x 12, then abd 1# x 10 Supine over half foam roll: Bil UE horz abd x12, bil UE scaption into a "V" x 15, then after short rest bil UE abd "snow angel" x 10; then alt UE flexion with 1# x12 each Manual Therapy MLD to Lt breast in supine as follows: Short neck, 5 diaphragmatic breaths, Lt inguinal and Rt axillary nodes, Lt axillo-inguinal and anterior inter-axillary anastomosis, then focused on Lt breast redirecting towards pathways then retracing all steps.  STM to Lt scar tissue inferior to axilla.  P/ROM to Lt shoulder into flex, abd, and D2 with scapular depression throughout  6/27//24: Therapeutic Exercises Pulleys flex and abd x 2 mins each Doorway pectoralis stretch 5x, 5 sec holds; then 2x 20-30 sec holds encouraging pt to hold this longer when doing at home.  Supine over full foam roll for following: Bil horz abd 2 x 5; then bil UE scaption into a "V" with VC's during for correct UE positioning throughout; then bil abd into a "snow angel" x 10 with 5 sec holds encouraging pt not to push into pain; then alt UE flexion with core engaged (for stability) and pt with improved A/ROM with this Manual Therapy MLD to Lt breast in supine as follows: Short neck, 5 diaphragmatic breaths, Lt inguinal and Rt axillary nodes, Lt axillo-inguinal and anterior inter-axillary anastomosis, then focused on Lt breast redirecting towards pathways then retracing all steps.  Scar tissue mobs to Lt breast incision where pt reports tightness and at axilla where scar tissue is firm here STM to Lt scar tissue inferior  to axilla.  P/ROM to Lt shoulder into flex, abd, and D2 with scapular depression throughout  04/08/23: Therapeutic Exercises Pulleys into flexion and abd x 2 mins each with VC's to decrease Lt scapular compensation throughout as pt reported feeling tighter today with increased pain Roll yellow ball into Lt flex x 15 and abd x 10  Supine over foam roll: Bil horz abd x 7, bil UE scaption "V" x 10, bil abd into a "snow angel" x 5 with less A/ROM but first time of full foam roll Manual Therapy MLD to Lt breast in supine as follows: Short neck, 5 diaphragmatic breaths, Lt inguinal and Rt axillary nodes, Lt axillo-inguinal and anterior inter-axillary anastomosis, then focused on Lt breast redirecting towards pathways then retracing all steps.  Scar tissue mobs to Lt breast incision where pt reports tightness and at axilla where pt reports increased tenderness and scar tissue is firm here; applied cocoa butter to Lt breast after session due to increased dry skin STM to Lt UT and at scar tissue inferior to axilla.  P/ROM to Lt shoulder for scar tissue mobs, into flex and abd, with scapular depression throughout      PATIENT EDUCATION:  Education details: Supine scapular series with yellow theraband Person educated: Patient Education method: Explanation, Demonstration, tactile and VC's and handout issued Education comprehension: Patient verbalized understanding, returned demo and will benefit from further review  HOME EXERCISE PROGRAM: Wear compression bra as much as possible Contact 2nd to Nature to obtain another compression bra Seated and supine AA/ROM Lt UE dowel exercises and Self Lt breast MLD Supine scapular series   ASSESSMENT:  CLINICAL IMPRESSION:  Pt comes in reporting her Lt shoulder has felt some better over past few days, even being able to sleep on it for short periods of time. Continued with A/ROM stretching and strengthening exs for Lt UE which pt is tolerating well as her  A/ROM with stretching over foam roll is much improved and with less pain/discomfort. Discussed current POC and pt is agreeable to D/C at next session. She does hope to transition soon to returning to PT for her chronic knee and LBP as she was receiving PT for this before her cancer diagnosis. Overall pt has made good progress with her Lt shoulder pain decreasing and her motion improving. Also her Lt breast lymphedema is improved since start of care as well and pt is dong well with self MLD at home. She iwll be ready for D/C at next session.   Pt will benefit from skilled therapeutic intervention to improve on the following deficits: Decreased knowledge of precautions, pain, decreased ROM, postural dysfunction, decreased knowledge of condition, increased edema  PT treatment/interventions: ADL/Self care home management, Therapeutic exercises, Therapeutic activity, Patient/Family education, Self Care, Orthotic/Fit training, Manual lymph drainage, Compression bandaging, scar mobilization, Taping, Vasopneumatic device, Manual therapy, and Re-evaluation   REHAB POTENTIAL: Good  CLINICAL DECISION MAKING: Stable/uncomplicated  EVALUATION COMPLEXITY: Low   GOALS: Goals reviewed with patient? YES  LONG TERM GOALS: (STG=LTG)    Name Target Date Goal status  1 Pt will be independent in self MLD for long term management of L breast lymphedema.    03/20/2023 MET  2 Pt will obtain appropriate compression bra for long term management of lymphedema. 03/20/23 MET    3 Pt will report a 75% decrease in pain in her L breast and axilla to allow improved comfort.   04/22/23 ONGOING  4 Pt will be independent in a home exercise program for continued stretching and strengthening.   04/22/23 ONGOING    PLAN:  PT FREQUENCY/DURATION: 2x/wk for 3 wks  PLAN FOR NEXT SESSION: D/C next visit, pt plans to get referral for chronic knee and LBP and would like to do aquatic therapy so may return here to our ortho side for  this treatment; Cont A/AA/ROM of Lt shoulder; Review bil UE 3 way raises; Cont MLD to L breast and review, cont STM to L axilla/MFR and cont/review AA/ROM exs and progress HEP prn.    Berna Spare, PTA 04/19/23 11:10 AM

## 2023-04-23 ENCOUNTER — Encounter: Payer: Self-pay | Admitting: Rehabilitation

## 2023-04-23 ENCOUNTER — Ambulatory Visit: Payer: 59 | Admitting: Rehabilitation

## 2023-04-23 DIAGNOSIS — R293 Abnormal posture: Secondary | ICD-10-CM

## 2023-04-23 DIAGNOSIS — L599 Disorder of the skin and subcutaneous tissue related to radiation, unspecified: Secondary | ICD-10-CM

## 2023-04-23 DIAGNOSIS — I890 Lymphedema, not elsewhere classified: Secondary | ICD-10-CM

## 2023-04-23 DIAGNOSIS — C50112 Malignant neoplasm of central portion of left female breast: Secondary | ICD-10-CM

## 2023-04-23 NOTE — Therapy (Addendum)
OUTPATIENT PHYSICAL THERAPY BREAST CANCER TREATMENT   Patient Name: Kelli Estrada MRN: [id number] DOB:23-Jan-1961, 62 y.o., female Today's Date: 04/23/2023   PT End of Session - 04/23/23 1710     Visit Number 14    Number of Visits 16    Date for PT Re-Evaluation 04/22/23    PT Start Time 1615    PT Stop Time 1656    PT Time Calculation (min) 41 min    Activity Tolerance Patient tolerated treatment well    Behavior During Therapy [facility] for tasks assessed/performed                 Past Medical History:  Diagnosis Date   Anxiety    Chronic pain    Depression    Family history of breast cancer 08/23/2022   H/O degenerative disc disease    History of radiation therapy    Left breast- 10/25/22-12/11/22- Dr. Antony Blackbird   History of radiation therapy    Left breast-10/25/22-12/11/22- Dr. Antony Blackbird   Pre-diabetes    PTSD (post-traumatic stress disorder)    Sleep apnea    Past Surgical History:  Procedure Laterality Date   ABDOMINAL HYSTERECTOMY     BREAST LUMPECTOMY WITH SENTINEL LYMPH NODE BIOPSY Left 09/14/2022   Procedure: LEFT BREAST CENTRAL LUMPECTOMY WITH SENTINEL LYMPH NODE BX;  Surgeon: Griselda Miner, MD;  Location: [facility];  Service: General;  Laterality: Left;   COLONOSCOPY WITH PROPOFOL N/A 03/15/2016   Procedure: COLONOSCOPY WITH PROPOFOL;  Surgeon: Dorena Cookey, MD;  Location: [facility];  Service: Endoscopy;  Laterality: N/A;   NECK SURGERY     Patient Active Problem List   Diagnosis Date Noted   Genetic testing 08/27/2022   Family history of breast cancer 08/23/2022   Malignant neoplasm of central portion of left breast in female, estrogen receptor positive (HCC) 08/20/2022   Chronic pain of left knee 06/03/2015   Depression, major, recurrent, moderate (HCC) 06/03/2015   Morbid obesity with body mass index of 50 or higher (HCC) 06/03/2015   Vitamin D deficiency 06/03/2015   PTSD (post-traumatic stress disorder) 06/03/2015    Obstructive sleep apnea 06/03/2015    REFERRING PROVIDER: Erven Colla, PA-C  REFERRING DIAG:C50.112,Z17.0 (ICD-10-CM) - Malignant neoplasm of central portion of left breast in female, estrogen receptor positive (HCC)  THERAPY DIAG:  Lymphedema, not elsewhere classified  Disorder of the skin and subcutaneous tissue related to radiation, unspecified  Abnormal posture  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 If it starts to ache I can massage it and rub on it.  I still get some stabbing pain.    PERTINENT HISTORY:  Patient was diagnosed on 07/18/2022 with left grade 2 invasive ductal carcinoma breast cancer. It measures 1.9 cm and is located in the central quadrant. It is ER/PR positive and HER2 negative with a Ki67 of 10%. 09/14/22 L breast lumpectomy and SLNB 1/3. Completed radiation. Hx of chronic pain, degenerative disc disease, PTSD  PATIENT GOALS:   to decrease the pain under the arm and on the trunk  PAIN:  Are you having pain? Mostly just feels sore  PRECAUTIONS: at risk for lymphedema Fall, DDD  HAND DOMINANCE: right  WEIGHT BEARING RESTRICTIONS: No  FALLS:  Has patient fallen in last 6 months? No  LIVING ENVIRONMENT: Patient lives with: her daughter Lives in: House/apartment Has following equipment at home: Single point cane  OCCUPATION: On disability  LEISURE: Walks some in her yard  PRIOR LEVEL OF FUNCTION: Independent   OBJECTIVE:  COGNITION: Overall cognitive status: Within functional limits for tasks assessed    POSTURE:  Forward head and  rounded shoulders posture  UPPER EXTREMITY AROM/PROM:  A/PROM RIGHT   08/22/22   Shoulder extension 40  Shoulder flexion 146  Shoulder abduction 149  Shoulder internal rotation 67  Shoulder external rotation 77    (Blank rows = not tested)  A/PROM LEFT   08/22/22 LEFT  02/20/23 LEFT 03/13/23   Shoulder extension 32 44 48   Shoulder flexion 139 155 158   Shoulder abduction 152 148 168   Shoulder internal rotation 60 52 61   Shoulder external rotation 75 88      (Blank rows = not tested)  UPPER EXTREMITY STRENGTH: WFL  LYMPHEDEMA ASSESSMENTS:   LANDMARK RIGHT   08/22/22 RIGHT 02/20/23  10 cm proximal to olecranon process 40 40  Olecranon process 27.5 27.3  10 cm proximal to ulnar styloid process 25.2 23.8  Just proximal to ulnar styloid process 15.8 16  Across hand at thumb web space 17.9 18  At base of 2nd digit 5.9 5.9  (Blank rows = not tested)  LANDMARK LEFT   08/22/22 LEFT 02/20/23  10 cm proximal to olecranon process 40.2 37  Olecranon process 26.5 27.4  10 cm proximal to ulnar styloid process 25.1 24.7  Just proximal to ulnar styloid process 16.3 15.9  Across hand at thumb web space 18.3 18.4  At base of 2nd digit 6.5 6.1  (Blank rows = not tested)  L-DEX LYMPHEDEMA SCREENING: The patient was assessed using the L-Dex machine today to produce a lymphedema index baseline score. The patient will be reassessed on a regular basis (typically every 3 months) to obtain new L-Dex scores. If the score is > 6.5 points away from his/her baseline score indicating onset of subclinical lymphedema, it will be recommended to wear a compression garment for 4 weeks, 12 hours per day and then be reassessed. If the score continues to be > 6.5 points from baseline at reassessment, we will initiate lymphedema treatment. Assessing in this manner has a 95% rate of preventing clinically significant lymphedema.  LLIS:  EVAL: 33.82   03/25/23: 45%    QDASH: EVAL: 36%   03/25/23: 50%   04/23/23:  50%  TODAY'S DATE: 04/23/23: DC reassessment x Therapeutic Exercises Pulleys into flexion x 3 mins , then abd x 3 mins  Bil UE 3 way raises standing with back against wall/core engaged, and shoulders and head against wall: Flexion and scaption 2# x 12, then abd x 10 Supine over foam roll: Bil UE horz abd x12, bil UE scaption into a "V" x 15, then after short rest bil UE abd "snow angel" x 10; then alt UE flexion with x12 each Manual Therapy P/ROM to Lt shoulder into flex, abd, and D2 without any limitations today.   04/19/23: Therapeutic Exercises Pulleys into flexion x 2 mins , then abd x 3 mins Bil UE 3 way raises standing with back against wall/core engaged, and shoulders and head against wall: Flexion and scaption 2# x 12, then abd 1# x 10 Supine over half foam roll: Bil UE horz abd x12, bil UE scaption into a "V" x 15, then after short rest bil UE abd "snow angel" x 10; then alt UE flexion with 1# x12 each Manual Therapy MLD to Lt breast in supine as follows: Short neck, 5 diaphragmatic breaths, Lt inguinal and Rt axillary nodes, Lt axillo-inguinal and anterior inter-axillary anastomosis, then focused on Lt breast redirecting towards pathways then retracing all steps.  STM to Lt scar tissue inferior to axilla.  P/ROM to Lt shoulder into flex, abd, and D2 with scapular depression throughout  6/27//24: Therapeutic Exercises Pulleys flex and abd x 2 mins each Doorway pectoralis stretch 5x, 5 sec holds; then 2x 20-30 sec holds encouraging pt to hold this longer when doing at home.  Supine over full foam roll for following: Bil horz abd 2 x 5; then bil UE scaption into a "V" with VC's during for correct UE positioning throughout; then bil abd into a "snow angel" x 10 with 5 sec holds encouraging pt not to push into pain; then alt UE flexion with core engaged (for stability) and pt with improved A/ROM with this Manual Therapy MLD to Lt breast in supine as follows: Short neck, 5  diaphragmatic breaths, Lt inguinal and Rt axillary nodes, Lt axillo-inguinal and anterior inter-axillary anastomosis, then focused on Lt breast redirecting towards pathways then retracing all steps.  Scar tissue mobs to Lt breast incision where pt reports tightness and at axilla where scar tissue is firm here STM to Lt scar tissue inferior to axilla.  P/ROM to Lt shoulder into flex, abd, and D2 with scapular depression throughout  PATIENT EDUCATION:  Education details: Supine scapular series with yellow theraband Person educated: Patient Education method: Explanation, Demonstration, tactile and VC's and handout issued Education comprehension: Patient verbalized understanding, returned demo and will benefit from further review  HOME EXERCISE PROGRAM: Wear compression bra as much as possible Contact 2nd to Nature to obtain another compression bra Seated and supine AA/ROM Lt UE dowel exercises and Self Lt breast MLD Supine scapular series   ASSESSMENT:  CLINICAL IMPRESSION: Pt will attempt DC today.  All goals have been met and pt is knowledgeable about her HEP and self massage.  She still feels like the left breast is larger and gets randomly swollen with pain.  Her AROM/PROM were full.   Pt will benefit from skilled therapeutic intervention to improve on the following deficits: Decreased knowledge of precautions, pain, decreased ROM, postural dysfunction, decreased knowledge of condition, increased edema  PT treatment/interventions: ADL/Self care home management, Therapeutic exercises, Therapeutic activity, Patient/Family education, Self Care, Orthotic/Fit training, Manual lymph drainage, Compression bandaging, scar mobilization, Taping, Vasopneumatic device, Manual therapy, and Re-evaluation   REHAB POTENTIAL: Good  CLINICAL DECISION MAKING: Stable/uncomplicated  EVALUATION COMPLEXITY: Low  GOALS: Goals reviewed with patient? YES  LONG TERM GOALS: (STG=LTG)    Name Target Date  Goal status  1 Pt will be independent in self MLD for long term management of L breast lymphedema.    03/20/2023 MET  2 Pt will obtain appropriate compression bra for long term management of lymphedema. 03/20/23 MET    3 Pt will report a 75% decrease in pain in her L breast and axilla to allow improved comfort. - I feel around 80%   04/22/23 MET  4 Pt will be independent in a home exercise program for continued stretching and strengthening.   04/22/23 MET    PLAN:  PT FREQUENCY/DURATION: 2x/wk for 3 wks  PLAN FOR NEXT SESSION: D/C next visit, pt plans to get referral for chronic knee and LBP and would like to do aquatic therapy so may return here to our ortho side for this treatment; Cont A/AA/ROM of Lt shoulder; Review bil UE 3 way raises; Cont MLD to L breast and review, cont STM to L axilla/MFR and cont/review AA/ROM exs and progress HEP prn.    Gwenevere Abbot, PT  04/23/23 5:11 PM  PHYSICAL THERAPY DISCHARGE SUMMARY  Visits from Start of Care: 14  Current functional level related to goals / functional outcomes: See above   Remaining deficits: Lymphedema risk, breast pain   Education / Equipment: Final HEP  Plan: Patient agrees to discharge.  Patient is being discharged due to meeting the stated rehab goals.

## 2023-05-01 ENCOUNTER — Other Ambulatory Visit (HOSPITAL_COMMUNITY): Payer: Self-pay

## 2023-05-01 ENCOUNTER — Inpatient Hospital Stay: Payer: 59

## 2023-05-01 ENCOUNTER — Inpatient Hospital Stay: Payer: 59 | Attending: Hematology and Oncology | Admitting: Hematology and Oncology

## 2023-05-01 ENCOUNTER — Other Ambulatory Visit: Payer: Self-pay

## 2023-05-01 VITALS — BP 121/75 | HR 70 | Temp 98.1°F | Resp 18 | Ht 63.78 in | Wt 262.2 lb

## 2023-05-01 DIAGNOSIS — C50112 Malignant neoplasm of central portion of left female breast: Secondary | ICD-10-CM | POA: Diagnosis present

## 2023-05-01 DIAGNOSIS — Z90710 Acquired absence of both cervix and uterus: Secondary | ICD-10-CM | POA: Insufficient documentation

## 2023-05-01 DIAGNOSIS — Z170 Estrogen receptor positive status [ER+]: Secondary | ICD-10-CM | POA: Insufficient documentation

## 2023-05-01 DIAGNOSIS — K219 Gastro-esophageal reflux disease without esophagitis: Secondary | ICD-10-CM | POA: Insufficient documentation

## 2023-05-01 DIAGNOSIS — Z803 Family history of malignant neoplasm of breast: Secondary | ICD-10-CM | POA: Insufficient documentation

## 2023-05-01 DIAGNOSIS — Z79811 Long term (current) use of aromatase inhibitors: Secondary | ICD-10-CM | POA: Diagnosis not present

## 2023-05-01 DIAGNOSIS — Z801 Family history of malignant neoplasm of trachea, bronchus and lung: Secondary | ICD-10-CM | POA: Insufficient documentation

## 2023-05-01 DIAGNOSIS — Z923 Personal history of irradiation: Secondary | ICD-10-CM | POA: Diagnosis not present

## 2023-05-01 LAB — CBC WITH DIFFERENTIAL (CANCER CENTER ONLY)
Abs Immature Granulocytes: 0.03 10*3/uL (ref 0.00–0.07)
Basophils Absolute: 0 10*3/uL (ref 0.0–0.1)
Basophils Relative: 1 %
Eosinophils Absolute: 0.2 10*3/uL (ref 0.0–0.5)
Eosinophils Relative: 4 %
HCT: 37 % (ref 36.0–46.0)
Hemoglobin: 12.3 g/dL (ref 12.0–15.0)
Immature Granulocytes: 1 %
Lymphocytes Relative: 17 %
Lymphs Abs: 1 10*3/uL (ref 0.7–4.0)
MCH: 29.9 pg (ref 26.0–34.0)
MCHC: 33.2 g/dL (ref 30.0–36.0)
MCV: 90 fL (ref 80.0–100.0)
Monocytes Absolute: 0.4 10*3/uL (ref 0.1–1.0)
Monocytes Relative: 6 %
Neutro Abs: 4.1 10*3/uL (ref 1.7–7.7)
Neutrophils Relative %: 71 %
Platelet Count: 148 10*3/uL — ABNORMAL LOW (ref 150–400)
RBC: 4.11 MIL/uL (ref 3.87–5.11)
RDW: 13.9 % (ref 11.5–15.5)
WBC Count: 5.7 10*3/uL (ref 4.0–10.5)
nRBC: 0 % (ref 0.0–0.2)

## 2023-05-01 LAB — CMP (CANCER CENTER ONLY)
ALT: 10 U/L (ref 0–44)
AST: 10 U/L — ABNORMAL LOW (ref 15–41)
Albumin: 3.8 g/dL (ref 3.5–5.0)
Alkaline Phosphatase: 67 U/L (ref 38–126)
Anion gap: 7 (ref 5–15)
BUN: 20 mg/dL (ref 6–20)
CO2: 25 mmol/L (ref 22–32)
Calcium: 9.3 mg/dL (ref 8.9–10.3)
Chloride: 107 mmol/L (ref 98–111)
Creatinine: 0.75 mg/dL (ref 0.44–1.00)
GFR, Estimated: >60 mL/min (ref >60)
Glucose, Bld: 103 mg/dL — ABNORMAL HIGH (ref 70–99)
Potassium: 3.7 mmol/L (ref 3.5–5.1)
Sodium: 139 mmol/L (ref 135–145)
Total Bilirubin: 0.3 mg/dL (ref 0.3–1.2)
Total Protein: 6.3 g/dL — ABNORMAL LOW (ref 6.5–8.1)

## 2023-05-01 NOTE — Progress Notes (Signed)
BRIEF ONCOLOGIC HISTORY:  Oncology History  Malignant neoplasm of central portion of left breast in female, estrogen receptor positive (HCC)  07/18/2022 Imaging   Mammogram showed indeterminate left breast mass central to the nipple in the retroareolar region, indeterminate lymph node.  Ultrasound done showed 1.9 x 1.4 x 1.5 cm taller than wide irregular mass in the left breast highly suggestive of malignancy.  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  Report date is 08/29/2022.   The CustomNext-Cancer+RNAinsight panel offered by [employer] includes sequencing and rearrangement analysis for the following 47 genes:  APC, ATM, AXIN2, BARD1, BMPR1A, BRCA1, BRCA2, BRIP1, CDH1, CDK4, CDKN2A, CHEK2, DICER1, EPCAM, GREM1, HOXB13, MEN1, MLH1, MSH2, MSH3, MSH6, MUTYH, NBN, NF1, NF2, NTHL1, PALB2, PMS2, POLD1, POLE, PTEN, RAD51C, RAD51D, RECQL, RET, SDHA, SDHAF2, SDHB, SDHC, SDHD, SMAD4, SMARCA4, STK11, TP53, TSC1, TSC2, and VHL.  RNA data is routinely analyzed for use in variant interpretation for all genes.   09/14/2022 Surgery   Left lumpectomy, IDC, 2.1cm, g2, margins negative, 1/3 LN positive for metastases, T2n1a   10/04/2022 Oncotype testing   Oncotype DX of 15, no role for adj chemo.   10/25/2022 - 12/11/2022 Radiation Therapy   Plan Name: Breast_L_BH Site: Breast,  Left Technique: 3D Mode: Photon Dose Per Fraction: 1.8 Gy Prescribed Dose (Delivered / Prescribed): 50.4 Gy / 50.4 Gy Prescribed Fxs (Delivered / Prescribed): 28 / 28   Plan Name: Brst_L_SCV_BH Site: Breast, Left Technique: 3D Mode: Photon Dose Per Fraction: 1.8 Gy Prescribed Dose (Delivered / Prescribed): 50.4 Gy / 50.4 Gy Prescribed Fxs (Delivered / Prescribed): 28 / 28   Plan Name: Brst_L_Bst_BH Site: Breast, Left Technique: 3D Mode: Photon Dose Per Fraction: 2 Gy Prescribed Dose (Delivered / Prescribed): 10 Gy / 10 Gy Prescribed Fxs (Delivered / Prescribed): 5 / 5     12/2022 -  Anti-estrogen oral therapy   Anastrozole daily, Verzenio added in 02/2023     INTERVAL HISTORY:   She notes that she is taking Anastrozole and Verzenio.  She tolerates these well.  She is doing very well on this combination. She hasn't noticed much diarrhea or nausea. She has some intermittent acid reflux. She is having her cataract surgery soon. She denies any breast changes today.  She is overall motivated to continue the current combination.  REVIEW OF SYSTEMS:  Review of Systems  Constitutional:  Negative for appetite change, chills, fatigue, fever and unexpected weight change.  HENT:   Negative for hearing loss, lump/mass and trouble swallowing.   Eyes:  Negative for eye problems and icterus.  Respiratory:  Negative for chest tightness, cough and shortness of breath.   Cardiovascular:  Negative for chest pain, leg swelling and palpitations.  Gastrointestinal:  Negative for abdominal distention, abdominal pain, constipation, diarrhea, nausea and vomiting.  Endocrine: Negative for hot flashes.  Genitourinary:  Negative for difficulty urinating.   Musculoskeletal:  Negative for arthralgias.  Skin:  Negative for itching and rash.  Neurological:  Negative for dizziness, extremity weakness, headaches and numbness.  Hematological:  Negative for adenopathy. Does not bruise/bleed easily.   Psychiatric/Behavioral:  Negative for depression. The patient is nervous/anxious.    Breast: Denies any new nodularity, masses, tenderness, nipple changes, or nipple discharge.       PAST MEDICAL/SURGICAL HISTORY:  Past Medical History:  Diagnosis Date   Anxiety    Chronic pain    Depression    Family history of breast cancer 08/23/2022   H/O degenerative disc disease    History of radiation therapy    Left breast- 10/25/22-12/11/22- Dr. Antony Blackbird   History of radiation therapy    Left breast-10/25/22-12/11/22- Dr. Antony Blackbird   Pre-diabetes    PTSD (post-traumatic stress disorder)    Sleep apnea    Past Surgical History:  Procedure Laterality Date   ABDOMINAL HYSTERECTOMY     BREAST LUMPECTOMY WITH SENTINEL LYMPH NODE BIOPSY Left 09/14/2022   Procedure: LEFT BREAST CENTRAL LUMPECTOMY WITH SENTINEL LYMPH NODE BX;  Surgeon: Griselda Miner, MD;  Location: [facility];  Service: General;  Laterality: Left;   COLONOSCOPY WITH PROPOFOL N/A 03/15/2016   Procedure: COLONOSCOPY WITH PROPOFOL;  Surgeon: Dorena Cookey, MD;  Location: [facility];  Service: Endoscopy;  Laterality: N/A;   NECK SURGERY       ALLERGIES:  No Known Allergies   CURRENT MEDICATIONS:  Outpatient Encounter Medications as of 05/01/2023  Medication Sig   abemaciclib (VERZENIO) 100 MG tablet Take 1 tablet (100 mg total) by mouth 2 (two) times daily.   albuterol (VENTOLIN HFA) 108 (90 Base) MCG/ACT inhaler SMARTSIG:1 Puff(s) Via Inhaler 4 Times Daily PRN   alendronate (FOSAMAX) 70 MG tablet Take 70 mg by mouth once a week.   anastrozole (ARIMIDEX) 1 MG tablet Take 1 tablet (1 mg total) by mouth daily.   aspirin 500 MG EC tablet Take 500 mg by mouth every 6 (six) hours as needed for pain. (Patient not taking: Reported on 04/02/2023)   Blood Glucose Monitoring Suppl (ONETOUCH VERIO REFLECT) w/Device KIT See admin instructions.   Cholecalciferol (VITAMIN D3) 250 MCG (10000 UT) capsule Take 10,000  Units by mouth daily.   [employer] VITAMIN E 180 MG (400 UNIT) CAPS Take 2 capsules by mouth daily.   fenofibrate (TRICOR) 48 MG tablet Take 40 mg by mouth daily.   Fluticasone-Umeclidin-Vilant (TRELEGY ELLIPTA) 100-62.5-25 MCG/ACT AEPB Inhale 100 mcg into the lungs 1 day or 1 dose.   gabapentin (NEURONTIN) 300 MG capsule Take 300 mg by mouth 3 (three) times daily.   Ginger 500 MG CAPS Take by mouth.   hydrOXYzine (VISTARIL) 25 MG capsule Take 25 mg by mouth 2 (two) times daily as needed for anxiety.    loperamide (IMODIUM) 2 MG capsule Take 2 mg by mouth as needed for diarrhea or loose stools. (Patient not taking: Reported on 04/02/2023)   metFORMIN (GLUCOPHAGE) 500 MG tablet SMARTSIG:1 Tablet(s) By Mouth Every Evening   mirabegron ER (MYRBETRIQ) 25 MG TB24 tablet Take 1 tablet (25 mg total) by mouth daily.   Misc Natural Products (OSTEO BI-FLEX JOINT SHIELD PO) Take by mouth daily.   omeprazole (PRILOSEC OTC) 20 MG tablet Take 20 mg by mouth daily.   ondansetron (ZOFRAN) 8 MG tablet Take 1 tablet (8 mg total) by mouth every 8 (eight) hours as needed for nausea or vomiting. (Patient not taking: Reported on 04/02/2023)   Oxcarbazepine (TRILEPTAL) 300 MG  tablet Take 300 mg by mouth 2 (two) times daily.   oxyCODONE-acetaminophen (PERCOCET/ROXICET) 5-325 MG tablet Take 1 tablet by mouth every 6 (six) hours as needed (For pain.).    OZEMPIC, 1 MG/DOSE, 4 MG/3ML SOPN Inject 1 mg into the skin once a week.   prochlorperazine (COMPAZINE) 10 MG tablet Take 1 tablet (10 mg total) by mouth every 6 (six) hours as needed for nausea or vomiting. (Patient not taking: Reported on 04/02/2023)   rosuvastatin (CRESTOR) 20 MG tablet Take 20 mg by mouth at bedtime.   sertraline (ZOLOFT) 100 MG tablet Take 100 mg by mouth 2 (two) times daily.   tamsulosin (FLOMAX) 0.4 MG CAPS capsule Take 1 capsule (0.4 mg total) by mouth daily.   topiramate (TOPAMAX) 100 MG tablet Take 100 mg by mouth daily.   No facility-administered  encounter medications on file as of 05/01/2023.     ONCOLOGIC FAMILY HISTORY:  Family History  Problem Relation Age of Onset   Lung cancer Mother        dx > 13   Breast cancer Maternal Grandmother 72     SOCIAL HISTORY:  Social History   Socioeconomic History   Marital status: Single    Spouse name: Not on file   Number of children: Not on file   Years of education: Not on file   Highest education level: Not on file  Occupational History   Not on file  Tobacco Use   Smoking status: Never   Smokeless tobacco: Never  Vaping Use   Vaping status: Never Used  Substance and Sexual Activity   Alcohol use: Not Currently    Comment: occasionally    Drug use: No   Sexual activity: Not Currently    Birth control/protection: Surgical  Other Topics Concern   Not on file  Social History Narrative   Not on file   Social Determinants of Health   Financial Resource Strain: Medium Risk (10/18/2022)   Overall Financial Resource Strain (CARDIA)    Difficulty of Paying Living Expenses: Somewhat hard  Food Insecurity: Not on file  Transportation Needs: Not on file  Physical Activity: Not on file  Stress: Not on file  Social Connections: Not on file  Intimate Partner Violence: Not on file     OBSERVATIONS/OBJECTIVE:  BP 121/75 (BP Location: Left Arm, Patient Position: Sitting)   Pulse 70   Temp 98.1 F (36.7 C) (Tympanic)   Resp 18   Ht 5' 3.78" (1.62 m)   Wt 262 lb 3.2 oz (118.9 kg)   SpO2 97%   BMI 45.32 kg/m   GENERAL: Patient is a well appearing female in no acute distress HEENT:  Sclerae anicteric.  Oropharynx clear and moist. No ulcerations or evidence of oropharyngeal candidiasis. Neck is supple.  NODES:  No cervical, supraclavicular, or axillary lymphadenopathy palpated.  BREAST EXAM: Left breast status postlumpectomy and radiation no sign of local recurrence right breast is benign. LUNGS:  Clear to auscultation bilaterally.  No wheezes or rhonchi. HEART:   Regular rate and rhythm. No murmur appreciated. ABDOMEN:  Soft, nontender.  Positive, normoactive bowel sounds. No organomegaly palpated. MSK:  No focal spinal tenderness to palpation. Full range of motion bilaterally in the upper extremities. EXTREMITIES:  No peripheral edema.   SKIN:  Clear with no obvious rashes or skin changes. No nail dyscrasia. NEURO:  Nonfocal. Well oriented.  Appropriate affect.   LABORATORY DATA:  None for this visit.  DIAGNOSTIC IMAGING:  None for this visit.  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   Given early stage, strong ER/PR positivity and no evidence of lymph node involvement we have discussed about upfront surgery followed by Oncotype testing. We have discussed final pathology which showed 1 lymph node involvement malignancy. Oncotype resulted at 15,no benefit from chemotherapy.  We have reviewed that she should proceed with adjuvant radiation followed by antiestrogen +/- abema. She is tolerating current dose of abemaciclib well, mild nausea using Zofran and Compazine as needed.  I once again discussed that the Verzenio was mostly used in patients with multiple positive lymph nodes or with high-grade and high proliferation index.  Hence if she continues to have any issues with Verzenio, we will have low threshold to discontinue it.  We have discussed about the her most recent labs are without any significant abnormalities.  She will return to clinic in about 8 weeks.   *Total Encounter Time as defined by the Centers for Medicare and Medicaid Services includes, in addition to the face-to-face time of a patient visit (documented in the note above) non-face-to-face time: obtaining and reviewing  outside history, ordering and reviewing medications, tests or procedures, care coordination (communications with other health care professionals or caregivers) and documentation in the medical record.

## 2023-05-03 ENCOUNTER — Other Ambulatory Visit (HOSPITAL_COMMUNITY): Payer: Self-pay

## 2023-05-17 ENCOUNTER — Inpatient Hospital Stay: Payer: 59 | Attending: Hematology and Oncology | Admitting: Licensed Clinical Social Worker

## 2023-05-17 DIAGNOSIS — Z170 Estrogen receptor positive status [ER+]: Secondary | ICD-10-CM | POA: Insufficient documentation

## 2023-05-17 DIAGNOSIS — C50112 Malignant neoplasm of central portion of left female breast: Secondary | ICD-10-CM | POA: Insufficient documentation

## 2023-05-17 NOTE — Progress Notes (Signed)
[facility] CSW Progress Note  Clinical Child psychotherapist contacted patient by phone to return phone call about assistance locating resources for an air conditioner. CSW referred patient to [employer]  and [employer]. CSW sent patient email for locating resources in the community.  Marguerita Merles, LCSWA Clinical Social Worker [facility]

## 2023-05-20 ENCOUNTER — Ambulatory Visit: Payer: 59 | Attending: Radiology

## 2023-05-20 VITALS — Wt 262.4 lb

## 2023-05-20 DIAGNOSIS — Z483 Aftercare following surgery for neoplasm: Secondary | ICD-10-CM | POA: Insufficient documentation

## 2023-05-20 NOTE — Therapy (Signed)
OUTPATIENT PHYSICAL THERAPY SOZO SCREENING NOTE   Patient Name: Kelli Estrada MRN: [id number] DOB:02-12-1961, 62 y.o., female Today's Date: 05/20/2023  PCP: Frederich Chick., MD REFERRING PROVIDER: Erven Colla, PA-C   PT End of Session - 05/20/23 0908     Visit Number 14   # unchanged due to screen only   PT Start Time 0906    PT Stop Time 0910    PT Time Calculation (min) 4 min    Activity Tolerance Patient tolerated treatment well    Behavior During Therapy [facility] for tasks assessed/performed             Past Medical History:  Diagnosis Date   Anxiety    Chronic pain    Depression    Family history of breast cancer 08/23/2022   H/O degenerative disc disease    History of radiation therapy    Left breast- 10/25/22-12/11/22- Dr. Antony Blackbird   History of radiation therapy    Left breast-10/25/22-12/11/22- Dr. Antony Blackbird   Pre-diabetes    PTSD (post-traumatic stress disorder)    Sleep apnea    Past Surgical History:  Procedure Laterality Date   ABDOMINAL HYSTERECTOMY     BREAST LUMPECTOMY WITH SENTINEL LYMPH NODE BIOPSY Left 09/14/2022   Procedure: LEFT BREAST CENTRAL LUMPECTOMY WITH SENTINEL LYMPH NODE BX;  Surgeon: Griselda Miner, MD;  Location: [facility];  Service: General;  Laterality: Left;   COLONOSCOPY WITH PROPOFOL N/A 03/15/2016   Procedure: COLONOSCOPY WITH PROPOFOL;  Surgeon: Dorena Cookey, MD;  Location: [facility];  Service: Endoscopy;  Laterality: N/A;   NECK SURGERY     Patient Active Problem List   Diagnosis Date Noted   Genetic testing 08/27/2022   Family history of breast cancer 08/23/2022   Malignant neoplasm of central portion of left breast in female, estrogen receptor positive (HCC) 08/20/2022   Chronic pain of left knee 06/03/2015   Depression, major, recurrent, moderate (HCC) 06/03/2015   Morbid obesity with body mass index of 50 or higher (HCC) 06/03/2015   Vitamin D deficiency 06/03/2015   PTSD (post-traumatic stress  disorder) 06/03/2015   Obstructive sleep apnea 06/03/2015    REFERRING DIAG: left breast cancer at risk for lymphedema  THERAPY DIAG:  Aftercare following surgery for neoplasm  PERTINENT HISTORY: Patient was diagnosed on 07/18/2022 with left grade 2 invasive ductal carcinoma breast cancer. It measures 1.9 cm and is located in the central quadrant. It is ER/PR positive and HER2 negative with a Ki67 of 10%. 09/14/22 L breast lumpectomy and SLNB 1/3. Completed radiation. Hx of chronic pain, degenerative disc disease, PTSD   PRECAUTIONS: left UE Lymphedema risk, None  SUBJECTIVE: Pt returns for her 3 month L-Dex screen.   PAIN:  Are you having pain? No  SOZO SCREENING: Patient was assessed today using the SOZO machine to determine the lymphedema index score. This was compared to her baseline score. It was determined that she is within the recommended range when compared to her baseline and no further action is needed at this time. She will continue SOZO screenings. These are done every 3 months for 2 years post operatively followed by every 6 months for 2 years, and then annually.   L-DEX FLOWSHEETS - 05/20/23 0900       L-DEX LYMPHEDEMA SCREENING   Measurement Type Unilateral    L-DEX MEASUREMENT EXTREMITY Upper Extremity    POSITION  Standing    DOMINANT SIDE Right    At Risk Side Left  BASELINE SCORE (UNILATERAL) 12.5    L-DEX SCORE (UNILATERAL) -3    VALUE CHANGE (UNILAT) -15.5               Hermenia Bers, PTA 05/20/2023, 9:09 AM

## 2023-05-28 ENCOUNTER — Ambulatory Visit: Payer: 59 | Admitting: Pharmacist

## 2023-05-28 ENCOUNTER — Other Ambulatory Visit: Payer: 59

## 2023-05-28 ENCOUNTER — Other Ambulatory Visit (HOSPITAL_COMMUNITY): Payer: Self-pay

## 2023-05-29 ENCOUNTER — Other Ambulatory Visit: Payer: Self-pay | Admitting: Hematology and Oncology

## 2023-05-29 ENCOUNTER — Other Ambulatory Visit: Payer: Self-pay

## 2023-05-29 ENCOUNTER — Inpatient Hospital Stay: Payer: 59 | Admitting: Pharmacist

## 2023-05-29 ENCOUNTER — Other Ambulatory Visit (HOSPITAL_COMMUNITY): Payer: Self-pay

## 2023-05-29 ENCOUNTER — Inpatient Hospital Stay: Payer: 59

## 2023-05-29 VITALS — BP 121/66 | HR 81 | Temp 97.3°F | Resp 18 | Ht 63.78 in | Wt 288.6 lb

## 2023-05-29 DIAGNOSIS — C50112 Malignant neoplasm of central portion of left female breast: Secondary | ICD-10-CM

## 2023-05-29 DIAGNOSIS — Z170 Estrogen receptor positive status [ER+]: Secondary | ICD-10-CM | POA: Diagnosis not present

## 2023-05-29 LAB — CMP (CANCER CENTER ONLY)
ALT: 14 U/L (ref 0–44)
AST: 13 U/L — ABNORMAL LOW (ref 15–41)
Albumin: 3.9 g/dL (ref 3.5–5.0)
Alkaline Phosphatase: 58 U/L (ref 38–126)
Anion gap: 7 (ref 5–15)
BUN: 8 mg/dL (ref 6–20)
CO2: 27 mmol/L (ref 22–32)
Calcium: 8.9 mg/dL (ref 8.9–10.3)
Chloride: 106 mmol/L (ref 98–111)
Creatinine: 0.77 mg/dL (ref 0.44–1.00)
GFR, Estimated: >60 mL/min (ref >60)
Glucose, Bld: 93 mg/dL (ref 70–99)
Potassium: 4.1 mmol/L (ref 3.5–5.1)
Sodium: 140 mmol/L (ref 135–145)
Total Bilirubin: 0.4 mg/dL (ref 0.3–1.2)
Total Protein: 6.6 g/dL (ref 6.5–8.1)

## 2023-05-29 LAB — CBC WITH DIFFERENTIAL (CANCER CENTER ONLY)
Abs Immature Granulocytes: 0.03 10*3/uL (ref 0.00–0.07)
Basophils Absolute: 0 10*3/uL (ref 0.0–0.1)
Basophils Relative: 1 %
Eosinophils Absolute: 0.3 10*3/uL (ref 0.0–0.5)
Eosinophils Relative: 5 %
HCT: 41.1 % (ref 36.0–46.0)
Hemoglobin: 13.9 g/dL (ref 12.0–15.0)
Immature Granulocytes: 1 %
Lymphocytes Relative: 18 %
Lymphs Abs: 1 10*3/uL (ref 0.7–4.0)
MCH: 30.3 pg (ref 26.0–34.0)
MCHC: 33.8 g/dL (ref 30.0–36.0)
MCV: 89.7 fL (ref 80.0–100.0)
Monocytes Absolute: 0.4 10*3/uL (ref 0.1–1.0)
Monocytes Relative: 7 %
Neutro Abs: 3.7 10*3/uL (ref 1.7–7.7)
Neutrophils Relative %: 68 %
Platelet Count: 152 10*3/uL (ref 150–400)
RBC: 4.58 MIL/uL (ref 3.87–5.11)
RDW: 13.8 % (ref 11.5–15.5)
WBC Count: 5.4 10*3/uL (ref 4.0–10.5)
nRBC: 0 % (ref 0.0–0.2)

## 2023-05-29 MED ORDER — ABEMACICLIB 100 MG PO TABS
100.0000 mg | ORAL_TABLET | Freq: Two times a day (BID) | ORAL | 1 refills | Status: DC
  Filled 2023-05-29: qty 56, 28d supply, fill #0
  Filled 2023-07-01: qty 56, 28d supply, fill #1

## 2023-05-29 NOTE — Progress Notes (Signed)
[facility]       Telephone: [phone]?Fax: [phone]   Oncology Clinical Pharmacist Practitioner Progress Note  Kelli Estrada was contacted via in-person to discuss her chemotherapy regimen for abemaciclib which they receive under the care of Dr. Rachel Moulds.   Current treatment regimen and start date Abemaciclib (12/28/22) Anastrozole (12/31/22)   Interval History She continues on abemaciclib 100 mg by mouth every 12 hours on days 1 to 28 of a 28-day cycle. This is being given in combination with anastrozole . Therapy is planned to continue until two years in the adjuvant setting per the monarch[employer] trial data. She was seen today as a follow up to her abemaciclib management. Kelli Estrada was last seen by clinical pharmacy on 04/02/23 and Dr. Al Pimple on 05/01/23. She had one episode of blood clots in her stool and vomiting. She thinks it was something she ate. Out of an abundance of caution, she saw her PCP last Dr. Malen Gauze at [facility] and will see a gastroenterologist on 06/04/23 Dr. Cloretta Ned. She has had no other episodes and her labs were good today  Response to Therapy Overall, she is tolerating abemaciclib well. She has some loose stools but used diet to help with controlling it. She does not like to take loperamide but has it "just in case". She is also having very little nausea. She will see Dr. Al Pimple on 06/27/23 and we will add labs to this visit. She will then see clinical pharmacy with labs in 2 months. She knows to contact the clinic sooner with any questions or concerns in the interim. We had discussed increasing her dose but at this time, we will keep at 100 mg BID. A refill was approved to [facility]. Labs, vitals, treatment parameters, and manufacturer guidelines assessing toxicity were reviewed with Kelli Estrada today. Based on these values, patient is in agreement to continue abemaciclib therapy at this time.  Allergies No Known Allergies  Vitals    05/29/2023    2:19  PM 05/20/2023    9:06 AM 05/01/2023   12:25 PM  Oncology Vitals  Height 162 cm  162 cm  Weight 130.908 kg 119.013 kg 118.933 kg  Weight (lbs) 288 lbs 10 oz 262 lbs 6 oz 262 lbs 3 oz  BMI 49.88 kg/m2 45.35 kg/m2 45.32 kg/m2  Temp 97.3 F (36.3 C)  98.1 F (36.7 C)  Pulse Rate 81  70  BP 121/66  121/75  Resp 18  18  SpO2 99 %  97 %  BSA (m2) 2.43 m2 2.31 m2 2.31 m2    Laboratory Data    Latest Ref Rng & Units 05/29/2023    1:57 PM 05/01/2023   11:58 AM 04/02/2023    1:32 PM  CBC EXTENDED  WBC 4.0 - 10.5 K/uL 5.4  5.7  4.4   RBC 3.87 - 5.11 MIL/uL 4.58  4.11  4.06   Hemoglobin 12.0 - 15.0 g/dL 29.5  62.1  30.8   HCT 36.0 - 46.0 % 41.1  37.0  37.0   Platelets 150 - 400 K/uL 152  148  160   NEUT# 1.7 - 7.7 K/uL 3.7  4.1  3.0   Lymph# 0.7 - 4.0 K/uL 1.0  1.0  0.9        Latest Ref Rng & Units 05/29/2023    1:57 PM 05/01/2023   11:58 AM 04/02/2023    1:32 PM  CMP  Glucose 70 - 99 mg/dL 93  657  846  BUN 6 - 20 mg/dL 8  20  9    Creatinine 0.44 - 1.00 mg/dL 2.95  2.84  1.32   Sodium 135 - 145 mmol/L 140  139  141   Potassium 3.5 - 5.1 mmol/L 4.1  3.7  4.5   Chloride 98 - 111 mmol/L 106  107  107   CO2 22 - 32 mmol/L 27  25  28    Calcium 8.9 - 10.3 mg/dL 8.9  9.3  8.6   Total Protein 6.5 - 8.1 g/dL 6.6  6.3  5.8   Total Bilirubin 0.3 - 1.2 mg/dL 0.4  0.3  0.4   Alkaline Phos 38 - 126 U/L 58  67  54   AST 15 - 41 U/L 13  10  14    ALT 0 - 44 U/L 14  10  14      No results found for: "MG" No results found for: "CA2729"   Adverse Effects Assessment Loose stools: mild. Has loperamide PRN but not using. Had one bout that had blood clots with vomiting but thought to be something she ate. Remained afebrile. No  signs of internal bleeding in labs today and said it only happened once. She says she feels good today. She is following up with GI after seeing her PCP last week Nausea: mild. Not using anti-nausea meds currently  Adherence Assessment Kelli Estrada reports missing 2 doses over  the past 4 weeks.   Reason for missed dose: forgot Patient was re-educated on importance of adherence.   Access Assessment Kelli Estrada is currently receiving her abemaciclib through [facility] concerns:  none  Medication Reconciliation The patient's medication list was reviewed today with the patient? Yes New medications or herbal supplements have recently been started? No  Any medications have been discontinued? No  The medication list was updated and reconciled based on the patient's most recent medication list in the electronic medical record (EMR) including herbal products and OTC medications.   Medications Current Outpatient Medications  Medication Sig Dispense Refill   abemaciclib (VERZENIO) 100 MG tablet Take 1 tablet (100 mg total) by mouth 2 (two) times daily. 56 tablet 1   albuterol (VENTOLIN HFA) 108 (90 Base) MCG/ACT inhaler SMARTSIG:1 Puff(s) Via Inhaler 4 Times Daily PRN     alendronate (FOSAMAX) 70 MG tablet Take 70 mg by mouth once a week.     anastrozole (ARIMIDEX) 1 MG tablet Take 1 tablet (1 mg total) by mouth daily. 90 tablet 3   aspirin 500 MG EC tablet Take 500 mg by mouth every 6 (six) hours as needed for pain. (Patient not taking: Reported on 04/02/2023)     Blood Glucose Monitoring Suppl (ONETOUCH VERIO REFLECT) w/Device KIT See admin instructions.     Cholecalciferol (VITAMIN D3) 250 MCG (10000 UT) capsule Take 10,000 Units by mouth daily.     CVS VITAMIN E 180 MG (400 UNIT) CAPS Take 2 capsules by mouth daily.     fenofibrate (TRICOR) 48 MG tablet Take 40 mg by mouth daily.     Fluticasone-Umeclidin-Vilant (TRELEGY ELLIPTA) 100-62.5-25 MCG/ACT AEPB Inhale 100 mcg into the lungs 1 day or 1 dose.     gabapentin (NEURONTIN) 300 MG capsule Take 300 mg by mouth 3 (three) times daily.     Ginger 500 MG CAPS Take by mouth.     hydrOXYzine (VISTARIL) 25 MG capsule Take 25 mg by mouth 2 (two) times daily as needed for anxiety.   0  loperamide (IMODIUM) 2 MG capsule Take 2 mg by mouth as needed for diarrhea or loose stools. (Patient not taking: Reported on 04/02/2023)     metFORMIN (GLUCOPHAGE) 500 MG tablet SMARTSIG:1 Tablet(s) By Mouth Every Evening     mirabegron ER (MYRBETRIQ) 25 MG TB24 tablet Take 1 tablet (25 mg total) by mouth daily. 30 tablet 11   Misc Natural Products (OSTEO BI-FLEX JOINT SHIELD PO) Take by mouth daily.     omeprazole (PRILOSEC OTC) 20 MG tablet Take 20 mg by mouth daily.     ondansetron (ZOFRAN) 8 MG tablet Take 1 tablet (8 mg total) by mouth every 8 (eight) hours as needed for nausea or vomiting. (Patient not taking: Reported on 04/02/2023) 30 tablet 2   Oxcarbazepine (TRILEPTAL) 300 MG tablet Take 300 mg by mouth 2 (two) times daily.     oxyCODONE-acetaminophen (PERCOCET/ROXICET) 5-325 MG tablet Take 1 tablet by mouth every 6 (six) hours as needed (For pain.).      OZEMPIC, 1 MG/DOSE, 4 MG/3ML SOPN Inject 1 mg into the skin once a week.     prochlorperazine (COMPAZINE) 10 MG tablet Take 1 tablet (10 mg total) by mouth every 6 (six) hours as needed for nausea or vomiting. (Patient not taking: Reported on 04/02/2023) 30 tablet 2   rosuvastatin (CRESTOR) 20 MG tablet Take 20 mg by mouth at bedtime.     sertraline (ZOLOFT) 100 MG tablet Take 100 mg by mouth 2 (two) times daily.  2   tamsulosin (FLOMAX) 0.4 MG CAPS capsule Take 1 capsule (0.4 mg total) by mouth daily. 90 capsule 2   topiramate (TOPAMAX) 100 MG tablet Take 100 mg by mouth daily.     No current facility-administered medications for this visit.   Drug-Drug Interactions (DDIs) DDIs were evaluated? Yes Significant DDIs? No  The patient was instructed to speak with their health care provider and/or the oral chemotherapy pharmacist before starting any new drug, including prescription or over the counter, natural / herbal products, or vitamins.  Supportive Care Diarrhea: we reviewed that diarrhea is common with abemaciclib and confirmed that  she does have loperamide (Imodium) at home.  We reviewed how to take this medication PRN. Neutropenia: we discussed the importance of having a thermometer and what the Centers for Disease Control and Prevention (CDC) considers a fever which is 100.17F (38C) or higher.  Gave patient 24/7 triage line to call if any fevers or symptoms. ILD/Pneumonitis: we reviewed potential symptoms including cough, shortness, and fatigue.  VTE: reviewed signs of DVT such as leg swelling, redness, pain, or tenderness and signs of PE such as shortness of breath, rapid or irregular heartbeat, cough, chest pain, or lightheadedness. Reviewed to take the medication every 12 hours (with food sometimes can be easier on the stomach) and to take it at the same time every day. Hepatotoxicity:WNL Drug interactions with grapefruit products  Dosing Assessment Hepatic adjustments needed? No  Renal adjustments needed? No  Toxicity adjustments needed? No  The current dosing regimen is appropriate to continue at this time.  Follow-Up Plan Continue abemaciclib 100 mg by mouth every 12 hours -- refill sent to [facility] today Continue anastrozole 1 mg by mouth daily Use loperamide (Imodium) as needed for loose stool Nausea improved, diarrhea improved. She will continue to be followed by her pain management doctor who she reports is also helping her with her other comorbidities (Dr. Malen Gauze from [facility]). Appreciate their recommendations She will see Dr. Cloretta Ned from gastroenterology. Referred by Dr. Malen Gauze  out of abundance of caution since one episode of blood clots in stool. Hgb WNL today and Ms. Lovelace says she thinks it is something she ate because she also vomited. Has not recurred and did not have a fever per her report. We will add labs, pharmacy clinic visit, in 8 weeks She will see Dr. Al Pimple with labs on 06/27/23.   Kelli Estrada participated in the discussion, expressed understanding, and voiced agreement with the above  plan. All questions were answered to her satisfaction. The patient was advised to contact the clinic at [phone] with any questions or concerns prior to her return visit.   I spent 30 minutes assessing and educating the patient.   A. Odetta Pink, PharmD, BCOP, CPP  Anselm Lis, RPH-CPP, 05/29/2023  2:18 PM   **Disclaimer: This note was dictated with voice recognition software. Similar sounding words can inadvertently be transcribed and this note may contain transcription errors which may not have been corrected upon publication of note.**

## 2023-05-31 ENCOUNTER — Other Ambulatory Visit (HOSPITAL_COMMUNITY): Payer: Self-pay

## 2023-06-24 ENCOUNTER — Other Ambulatory Visit (HOSPITAL_COMMUNITY): Payer: Self-pay

## 2023-06-26 ENCOUNTER — Ambulatory Visit: Payer: 59 | Admitting: Hematology and Oncology

## 2023-06-26 ENCOUNTER — Other Ambulatory Visit (HOSPITAL_COMMUNITY): Payer: Self-pay

## 2023-06-27 ENCOUNTER — Telehealth: Payer: Self-pay

## 2023-06-27 ENCOUNTER — Inpatient Hospital Stay: Payer: 59 | Attending: Hematology and Oncology | Admitting: Hematology and Oncology

## 2023-06-27 ENCOUNTER — Inpatient Hospital Stay: Payer: 59

## 2023-06-27 VITALS — BP 119/76 | HR 64 | Temp 97.6°F | Resp 19 | Wt 255.7 lb

## 2023-06-27 DIAGNOSIS — D696 Thrombocytopenia, unspecified: Secondary | ICD-10-CM | POA: Insufficient documentation

## 2023-06-27 DIAGNOSIS — Z79811 Long term (current) use of aromatase inhibitors: Secondary | ICD-10-CM | POA: Insufficient documentation

## 2023-06-27 DIAGNOSIS — K921 Melena: Secondary | ICD-10-CM | POA: Insufficient documentation

## 2023-06-27 DIAGNOSIS — D72819 Decreased white blood cell count, unspecified: Secondary | ICD-10-CM | POA: Diagnosis not present

## 2023-06-27 DIAGNOSIS — K649 Unspecified hemorrhoids: Secondary | ICD-10-CM | POA: Insufficient documentation

## 2023-06-27 DIAGNOSIS — Z170 Estrogen receptor positive status [ER+]: Secondary | ICD-10-CM | POA: Diagnosis not present

## 2023-06-27 DIAGNOSIS — Z923 Personal history of irradiation: Secondary | ICD-10-CM | POA: Diagnosis not present

## 2023-06-27 DIAGNOSIS — C50112 Malignant neoplasm of central portion of left female breast: Secondary | ICD-10-CM

## 2023-06-27 DIAGNOSIS — M170 Bilateral primary osteoarthritis of knee: Secondary | ICD-10-CM | POA: Diagnosis not present

## 2023-06-27 LAB — CMP (CANCER CENTER ONLY)
ALT: 12 U/L (ref 0–44)
AST: 13 U/L — ABNORMAL LOW (ref 15–41)
Albumin: 3.9 g/dL (ref 3.5–5.0)
Alkaline Phosphatase: 50 U/L (ref 38–126)
Anion gap: 7 (ref 5–15)
BUN: 8 mg/dL (ref 8–23)
CO2: 27 mmol/L (ref 22–32)
Calcium: 9 mg/dL (ref 8.9–10.3)
Chloride: 108 mmol/L (ref 98–111)
Creatinine: 0.81 mg/dL (ref 0.44–1.00)
GFR, Estimated: >60 mL/min (ref >60)
Glucose, Bld: 100 mg/dL — ABNORMAL HIGH (ref 70–99)
Potassium: 3.7 mmol/L (ref 3.5–5.1)
Sodium: 142 mmol/L (ref 135–145)
Total Bilirubin: 0.5 mg/dL (ref 0.3–1.2)
Total Protein: 6.5 g/dL (ref 6.5–8.1)

## 2023-06-27 LAB — CBC WITH DIFFERENTIAL (CANCER CENTER ONLY)
Abs Immature Granulocytes: 0.01 10*3/uL (ref 0.00–0.07)
Basophils Absolute: 0 10*3/uL (ref 0.0–0.1)
Basophils Relative: 1 %
Eosinophils Absolute: 0.1 10*3/uL (ref 0.0–0.5)
Eosinophils Relative: 2 %
HCT: 38.1 % (ref 36.0–46.0)
Hemoglobin: 12.8 g/dL (ref 12.0–15.0)
Immature Granulocytes: 0 %
Lymphocytes Relative: 24 %
Lymphs Abs: 0.8 10*3/uL (ref 0.7–4.0)
MCH: 29.8 pg (ref 26.0–34.0)
MCHC: 33.6 g/dL (ref 30.0–36.0)
MCV: 88.6 fL (ref 80.0–100.0)
Monocytes Absolute: 0.2 10*3/uL (ref 0.1–1.0)
Monocytes Relative: 7 %
Neutro Abs: 2.1 10*3/uL (ref 1.7–7.7)
Neutrophils Relative %: 66 %
Platelet Count: 116 10*3/uL — ABNORMAL LOW (ref 150–400)
RBC: 4.3 MIL/uL (ref 3.87–5.11)
RDW: 13.8 % (ref 11.5–15.5)
WBC Count: 3.2 10*3/uL — ABNORMAL LOW (ref 4.0–10.5)
nRBC: 0 % (ref 0.0–0.2)

## 2023-06-27 NOTE — Telephone Encounter (Signed)
DME order faxed to [facility] with successful confirmation of transmission.

## 2023-06-27 NOTE — Assessment & Plan Note (Addendum)
  On Verzenio 100mg  every 12 hours and Anastrozole daily. No new side effects reported.  CBC from today showed leukopenia and mild thrombocytopenia, okay to continue Verzenio and anastrozole.  CMP appeared normal. - Continue current medication regimen. -Next follow-up with [name] on October 9th, 2024.  Gastrointestinal Recent colonoscopy with no significant findings. Hemorrhoid identified as cause of blood in stool. No further action required at this time.  Ocular Recent cataract surgery with improved vision. Now requires glasses for reading. -No further action required at this time.  Pain Management Current regimen managed by pain management specialist. No changes reported. -Continue current pain management regimen.  General Health Maintenance -Scheduled mammogram on October 9th, 2024. -Request new prescription for bras from [facility].

## 2023-06-27 NOTE — Progress Notes (Signed)
Patient Care Team: Frederich Chick., MD as PCP - General (Family Medicine) Rachel Moulds, MD as Consulting Physician (Hematology and Oncology) Griselda Miner, MD as Consulting Physician (General Surgery) Antony Blackbird, MD as Consulting Physician (Radiation Oncology) Anselm Lis, RPH-CPP as Pharmacist (Hematology and Oncology)  DIAGNOSIS:  Encounter Diagnosis  Name Primary?   Malignant neoplasm of central portion of left breast in female, estrogen receptor positive (HCC) Yes    SUMMARY OF ONCOLOGIC HISTORY: Oncology History  Malignant neoplasm of central portion of left breast in female, estrogen receptor positive (HCC)  07/18/2022 Imaging   Mammogram showed indeterminate left breast mass central to the nipple in the retroareolar region, indeterminate lymph node.  Ultrasound done showed 1.9 x 1.4 x 1.5 cm taller than wide irregular mass in the left breast highly suggestive of malignancy.  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  Report date is 08/29/2022.   The CustomNext-Cancer+RNAinsight panel offered by [employer] includes sequencing and rearrangement analysis for the following 47 genes:  APC, ATM, AXIN2, BARD1, BMPR1A, BRCA1, BRCA2, BRIP1, CDH1, CDK4, CDKN2A, CHEK2, DICER1, EPCAM, GREM1, HOXB13, MEN1, MLH1, MSH2, MSH3, MSH6, MUTYH, NBN, NF1,  NF2, NTHL1, PALB2, PMS2, POLD1, POLE, PTEN, RAD51C, RAD51D, RECQL, RET, SDHA, SDHAF2, SDHB, SDHC, SDHD, SMAD4, SMARCA4, STK11, TP53, TSC1, TSC2, and VHL.  RNA data is routinely analyzed for use in variant interpretation for all genes.   09/14/2022 Surgery   Left lumpectomy, IDC, 2.1cm, g2, margins negative, 1/3 LN positive for metastases, T2n1a   10/04/2022 Oncotype testing   Oncotype DX of 15, no role for adj chemo.   10/25/2022 - 12/11/2022 Radiation Therapy   Plan Name: Breast_L_BH Site: Breast, Left Technique: 3D Mode: Photon Dose Per Fraction: 1.8 Gy Prescribed Dose (Delivered / Prescribed): 50.4 Gy / 50.4 Gy Prescribed Fxs (Delivered / Prescribed): 28 / 28   Plan Name: Brst_L_SCV_BH Site: Breast, Left Technique: 3D Mode: Photon Dose Per Fraction: 1.8 Gy Prescribed Dose (Delivered / Prescribed): 50.4 Gy / 50.4 Gy Prescribed Fxs (Delivered / Prescribed): 28 / 28   Plan Name: Brst_L_Bst_BH Site: Breast, Left Technique: 3D Mode: Photon Dose Per Fraction: 2 Gy Prescribed Dose (Delivered / Prescribed): 10 Gy / 10 Gy Prescribed Fxs (Delivered / Prescribed): 5 / 5     12/2022 -  Anti-estrogen oral therapy   Anastrozole daily, Verzenio added in 02/2023     Discussed the use of AI scribe software for clinical note transcription with the patient, who gave verbal consent to proceed.  History of Present Illness    Mr. Syniyah is here for follow-up on Verzenio and anastrozole.  Since her last visit here, the patient reports improved vision post cataract surgery, although she still requires glasses for reading. She recently underwent a colonoscopy due to the presence of blood in her stool, which was attributed to a hemorrhoid. She reports no new health concerns.  She reports occasional use of Imodium for diarrhea and zofran and Compazine for nausea, as needed. She has not needed these medications frequently of late. She also reports pain management with medication prescribed by her pain  doctor.  She reports a pinched nerve in her neck, osteoarthritis in her knees, and other back issues, for which she is considering pool therapy.   ALLERGIES:  has No Known Allergies.  MEDICATIONS:  Current Outpatient Medications  Medication Sig Dispense Refill   abemaciclib (VERZENIO) 100 MG tablet Take 1 tablet (100 mg total) by mouth 2 (two) times daily. 56 tablet 1   albuterol (VENTOLIN HFA) 108 (90 Base) MCG/ACT inhaler SMARTSIG:1 Puff(s) Via Inhaler 4 Times Daily PRN     alendronate (FOSAMAX) 70 MG tablet Take 70 mg by mouth once a week.     anastrozole (ARIMIDEX) 1 MG tablet Take 1 tablet (1 mg total) by mouth daily. 90 tablet 3   Blood Glucose Monitoring Suppl (ONETOUCH VERIO REFLECT) w/Device KIT See admin instructions.     Cholecalciferol (VITAMIN D3) 250 MCG (10000 UT) capsule Take 10,000 Units by mouth daily.     CVS VITAMIN E 180 MG (400 UNIT) CAPS Take 2 capsules by mouth daily.     fenofibrate (TRICOR) 48 MG tablet Take 40 mg by mouth daily.     Fluticasone-Umeclidin-Vilant (TRELEGY ELLIPTA) 100-62.5-25 MCG/ACT AEPB Inhale 100 mcg into the lungs 1 day or 1 dose.     gabapentin (NEURONTIN) 300 MG capsule Take 300 mg by mouth 3 (three) times daily.     Ginger 500 MG CAPS Take by mouth.     hydrOXYzine (VISTARIL) 25 MG capsule Take 25 mg by mouth 2 (two) times daily as needed for anxiety.   0   loperamide (IMODIUM) 2 MG capsule Take 2 mg by mouth as needed for diarrhea or loose stools.     mirabegron ER (MYRBETRIQ) 25 MG TB24 tablet Take 1 tablet (25 mg total) by mouth daily. 30 tablet 11   Misc Natural Products (OSTEO BI-FLEX JOINT SHIELD PO) Take by mouth daily.     omeprazole (PRILOSEC OTC) 20 MG tablet Take 20 mg by mouth daily.     ondansetron (ZOFRAN) 8 MG tablet Take 1 tablet (8 mg total) by mouth every 8 (eight) hours as needed for nausea or vomiting. 30 tablet 2   Oxcarbazepine (TRILEPTAL) 300 MG tablet Take 300 mg by mouth 2 (two) times daily.     oxyCODONE-acetaminophen  (PERCOCET/ROXICET) 5-325 MG tablet Take 1 tablet by mouth every 6 (six) hours as needed (For pain.).      OZEMPIC, 1 MG/DOSE, 4 MG/3ML SOPN Inject 1 mg into the skin once a week.     prochlorperazine (COMPAZINE) 10 MG tablet Take 1 tablet (10 mg total) by mouth every 6 (six) hours as needed for nausea or vomiting. 30 tablet 2   rosuvastatin (CRESTOR) 20 MG tablet Take 20 mg by mouth at bedtime.     sertraline (ZOLOFT) 100 MG tablet Take 100 mg by mouth 2 (two) times daily.  2   tamsulosin (FLOMAX) 0.4 MG CAPS capsule Take 1 capsule (0.4 mg total) by mouth daily. 90 capsule 2   topiramate (TOPAMAX) 100 MG tablet Take 100 mg by mouth daily.     No current facility-administered medications for this visit.    PHYSICAL EXAMINATION: ECOG PERFORMANCE STATUS: 0 - Asymptomatic  Vitals:   06/27/23 1426  BP: 119/76  Pulse: 64  Resp: 19  Temp: 97.6 F (36.4 C)  SpO2: 99%   Filed Weights   06/27/23 1426  Weight: 255 lb 11.2 oz (116 kg)    Physical Exam   General: Alert, oriented and in no acute distress. BREAST: No lumps palpated in the left breast, post-treatment changes noted. No lumps in the right breast, No regional adenopathy EXTREMITIES: No edema in legs.       LABORATORY DATA:  I have reviewed the data as listed    Latest Ref Rng & Units 06/27/2023    2:06 PM 05/29/2023    1:57 PM 05/01/2023   11:58 AM  CMP  Glucose 70 - 99 mg/dL 528  93  413   BUN 8 - 23 mg/dL 8  8  20    Creatinine 0.44 - 1.00 mg/dL 2.44  0.10  2.72   Sodium 135 - 145 mmol/L 142  140  139   Potassium 3.5 - 5.1 mmol/L 3.7  4.1  3.7   Chloride 98 - 111 mmol/L 108  106  107   CO2 22 - 32 mmol/L 27  27  25    Calcium 8.9 - 10.3 mg/dL 9.0  8.9  9.3   Total Protein 6.5 - 8.1 g/dL 6.5  6.6  6.3   Total Bilirubin 0.3 - 1.2 mg/dL 0.5  0.4  0.3   Alkaline Phos 38 - 126 U/L 50  58  67   AST 15 - 41 U/L 13  13  10    ALT 0 - 44 U/L 12  14  10      Lab Results  Component Value Date   WBC 3.2 (L) 06/27/2023   HGB  12.8 06/27/2023   HCT 38.1 06/27/2023   MCV 88.6 06/27/2023   PLT 116 (L) 06/27/2023   NEUTROABS 2.1 06/27/2023    ASSESSMENT & PLAN:  Malignant neoplasm of central portion of left breast in female, estrogen receptor positive (HCC)  On Verzenio 100mg  every 12 hours and Anastrozole daily. No new side effects reported.  CBC from today showed leukopenia and mild thrombocytopenia, okay to continue Verzenio and anastrozole.  CMP appeared normal. - Continue current medication regimen. -Next follow-up with [name] on October 9th, 2024.  Gastrointestinal Recent colonoscopy with no significant findings. Hemorrhoid identified as cause of blood in stool. No further action required at this time.  Ocular Recent cataract surgery with improved vision. Now requires glasses for reading. -No further action required at this time.  Pain Management Current regimen managed by pain management specialist. No changes reported. -Continue current pain management regimen.  General Health Maintenance -Scheduled mammogram on October 9th, 2024. -Request new prescription for bras from [facility].  ------------------------------------------------------------------------------------------------------------------------------ Assessment and Plan               No orders of the defined types were placed in this encounter.  The patient has a good understanding of the overall plan. she agrees with it. she will call with any problems that may develop before the next visit here. Total time spent: 30 mins including face to face time and time spent for planning, charting and co-ordination of care   Rachel Moulds, MD 06/27/23

## 2023-06-28 ENCOUNTER — Encounter (HOSPITAL_COMMUNITY): Payer: Self-pay

## 2023-06-28 ENCOUNTER — Other Ambulatory Visit (HOSPITAL_COMMUNITY): Payer: Self-pay

## 2023-07-01 ENCOUNTER — Other Ambulatory Visit: Payer: Self-pay

## 2023-07-01 ENCOUNTER — Other Ambulatory Visit (HOSPITAL_COMMUNITY): Payer: Self-pay

## 2023-07-08 ENCOUNTER — Ambulatory Visit: Payer: 59 | Admitting: Urology

## 2023-07-08 ENCOUNTER — Ambulatory Visit (HOSPITAL_COMMUNITY)
Admission: RE | Admit: 2023-07-08 | Discharge: 2023-07-08 | Disposition: A | Discharge status: Home / Self Care | Payer: 59 | Source: Ambulatory Visit | Attending: Urology | Admitting: Urology

## 2023-07-08 VITALS — BP 105/72 | HR 93

## 2023-07-08 DIAGNOSIS — N200 Calculus of kidney: Secondary | ICD-10-CM | POA: Insufficient documentation

## 2023-07-08 DIAGNOSIS — N3281 Overactive bladder: Secondary | ICD-10-CM

## 2023-07-08 DIAGNOSIS — N201 Calculus of ureter: Secondary | ICD-10-CM

## 2023-07-08 LAB — URINALYSIS, ROUTINE W REFLEX MICROSCOPIC
Bilirubin, UA: NEGATIVE
Glucose, UA: NEGATIVE
Ketones, UA: NEGATIVE
Nitrite, UA: NEGATIVE
Protein,UA: NEGATIVE
Specific Gravity, UA: 1.02 (ref 1.005–1.030)
Urobilinogen, Ur: 0.2 mg/dL (ref 0.2–1.0)
pH, UA: 6 (ref 5.0–7.5)

## 2023-07-08 LAB — MICROSCOPIC EXAMINATION

## 2023-07-08 MED ORDER — TAMSULOSIN HCL 0.4 MG PO CAPS: 0.4000 mg | ORAL_CAPSULE | Freq: Every day | ORAL | 3 refills | Status: DC

## 2023-07-08 MED ORDER — MIRABEGRON ER 25 MG PO TB24
25.0000 mg | ORAL_TABLET | Freq: Every day | ORAL | Status: DC
Start: 2023-07-08 — End: 2023-08-07

## 2023-07-08 NOTE — Progress Notes (Unsigned)
07/08/2023 11:51 AM   Kelli Estrada 05-18-1961 [id number]  Referring provider: Frederich Chick., MD [address] [place],  [place] [address]  No chief complaint on file.   HPI: KUB shows stable 8mm left renal pelvis calculus. NO flank pain. She is undergoing treatment for breast cancer. She takes mirabegron 25mg  for her urinary urgency which works well.    PMH: Past Medical History:  Diagnosis Date   Anxiety    Chronic pain    Depression    Family history of breast cancer 08/23/2022   H/O degenerative disc disease    History of radiation therapy    Left breast- 10/25/22-12/11/22- Dr. Antony Blackbird   History of radiation therapy    Left breast-10/25/22-12/11/22- Dr. Antony Blackbird   Pre-diabetes    PTSD (post-traumatic stress disorder)    Sleep apnea     Surgical History: Past Surgical History:  Procedure Laterality Date   ABDOMINAL HYSTERECTOMY     BREAST LUMPECTOMY WITH SENTINEL LYMPH NODE BIOPSY Left 09/14/2022   Procedure: LEFT BREAST CENTRAL LUMPECTOMY WITH SENTINEL LYMPH NODE BX;  Surgeon: Griselda Miner, MD;  Location: [facility];  Service: General;  Laterality: Left;   COLONOSCOPY WITH PROPOFOL N/A 03/15/2016   Procedure: COLONOSCOPY WITH PROPOFOL;  Surgeon: Dorena Cookey, MD;  Location: [facility];  Service: Endoscopy;  Laterality: N/A;   NECK SURGERY      Home Medications:  Allergies as of 07/08/2023   No Known Allergies      Medication List        Accurate as of July 08, 2023 11:51 AM. If you have any questions, ask your nurse or doctor.          albuterol 108 (90 Base) MCG/ACT inhaler Commonly known as: VENTOLIN HFA SMARTSIG:1 Puff(s) Via Inhaler 4 Times Daily PRN   alendronate 70 MG tablet Commonly known as: FOSAMAX Take 70 mg by mouth once a week.   anastrozole 1 MG tablet Commonly known as: ARIMIDEX Take 1 tablet (1 mg total) by mouth daily.   [employer] Vitamin E 180 MG (400 UNIT) Caps Generic drug: Vitamin E Take 2  capsules by mouth daily.   fenofibrate 48 MG tablet Commonly known as: TRICOR Take 40 mg by mouth daily.   gabapentin 300 MG capsule Commonly known as: NEURONTIN Take 300 mg by mouth 3 (three) times daily.   Ginger 500 MG Caps Take by mouth.   hydrOXYzine 25 MG capsule Commonly known as: VISTARIL Take 25 mg by mouth 2 (two) times daily as needed for anxiety.   loperamide 2 MG capsule Commonly known as: IMODIUM Take 2 mg by mouth as needed for diarrhea or loose stools.   mirabegron ER 25 MG Tb24 tablet Commonly known as: MYRBETRIQ Take 1 tablet (25 mg total) by mouth daily.   omeprazole 20 MG tablet Commonly known as: PRILOSEC OTC Take 20 mg by mouth daily.   ondansetron 8 MG tablet Commonly known as: ZOFRAN Take 1 tablet (8 mg total) by mouth every 8 (eight) hours as needed for nausea or vomiting.   OneTouch Verio Reflect w/Device Kit See admin instructions.   OSTEO BI-FLEX JOINT SHIELD PO Take by mouth daily.   Oxcarbazepine 300 MG tablet Commonly known as: TRILEPTAL Take 300 mg by mouth 2 (two) times daily.   oxyCODONE-acetaminophen 5-325 MG tablet Commonly known as: PERCOCET/ROXICET Take 1 tablet by mouth every 6 (six) hours as needed (For pain.).   Ozempic (1 MG/DOSE) 4 MG/3ML Sopn Generic  drug: Semaglutide (1 MG/DOSE) Inject 1 mg into the skin once a week.   prochlorperazine 10 MG tablet Commonly known as: COMPAZINE Take 1 tablet (10 mg total) by mouth every 6 (six) hours as needed for nausea or vomiting.   rosuvastatin 20 MG tablet Commonly known as: CRESTOR Take 20 mg by mouth at bedtime.   sertraline 100 MG tablet Commonly known as: ZOLOFT Take 100 mg by mouth 2 (two) times daily.   tamsulosin 0.4 MG Caps capsule Commonly known as: FLOMAX Take 1 capsule (0.4 mg total) by mouth daily.   topiramate 100 MG tablet Commonly known as: TOPAMAX Take 100 mg by mouth daily.   Trelegy Ellipta 100-62.5-25 MCG/ACT Aepb Generic drug:  Fluticasone-Umeclidin-Vilant Inhale 100 mcg into the lungs 1 day or 1 dose.   Verzenio 100 MG tablet Generic drug: abemaciclib Take 1 tablet (100 mg total) by mouth 2 (two) times daily.   Vitamin D3 250 MCG (10000 UT) capsule Take 10,000 Units by mouth daily.        Allergies: No Known Allergies  Family History: Family History  Problem Relation Age of Onset   Lung cancer Mother        dx > 47   Breast cancer Maternal Grandmother 36    Social History:  reports that she has never smoked. She has never used smokeless tobacco. She reports that she does not currently use alcohol. She reports that she does not use drugs.  ROS: All other review of systems were reviewed and are negative except what is noted above in HPI  Physical Exam: BP 105/72   Pulse 93   Constitutional:  Alert and oriented, No acute distress. HEENT: [place] AT, moist mucus membranes.  Trachea midline, no masses. Cardiovascular: No clubbing, cyanosis, or edema. Respiratory: Normal respiratory effort, no increased work of breathing. GI: Abdomen is soft, nontender, nondistended, no abdominal masses GU: No CVA tenderness.  Lymph: No cervical or inguinal lymphadenopathy. Skin: No rashes, bruises or suspicious lesions. Neurologic: Grossly intact, no focal deficits, moving all 4 extremities. Psychiatric: Normal mood and affect.  Laboratory Data: Lab Results  Component Value Date   WBC 3.2 (L) 06/27/2023   HGB 12.8 06/27/2023   HCT 38.1 06/27/2023   MCV 88.6 06/27/2023   PLT 116 (L) 06/27/2023    Lab Results  Component Value Date   CREATININE 0.81 06/27/2023    No results found for: "PSA"  No results found for: "TESTOSTERONE"  No results found for: "HGBA1C"  Urinalysis    Component Value Date/Time   COLORURINE YELLOW 05/28/2022 0419   APPEARANCEUR Cloudy (A) 02/05/2023 1352   LABSPEC 1.023 05/28/2022 0419   PHURINE 5.0 05/28/2022 0419   GLUCOSEU Negative 02/05/2023 1352   HGBUR MODERATE (A)  05/28/2022 0419   BILIRUBINUR Negative 02/05/2023 1352   KETONESUR NEGATIVE 05/28/2022 0419   PROTEINUR Negative 02/05/2023 1352   PROTEINUR 30 (A) 05/28/2022 0419   NITRITE Positive (A) 02/05/2023 1352   NITRITE NEGATIVE 05/28/2022 0419   LEUKOCYTESUR 1+ (A) 02/05/2023 1352   LEUKOCYTESUR TRACE (A) 05/28/2022 0419    Lab Results  Component Value Date   LABMICR See below: 02/05/2023   WBCUA 11-30 (A) 02/05/2023   LABEPIT 0-10 02/05/2023   BACTERIA Many (A) 02/05/2023    Pertinent Imaging: KUB today: Images reviewed and discussed with the patient  Results for orders placed during the hospital encounter of 02/05/23  DG Abd 1 View  Narrative CLINICAL DATA:  Nephrolithiasis, low back pain  EXAM: ABDOMEN -  1 VIEW  COMPARISON:  05/30/2022  FINDINGS: Small LEFT renal calculi measuring 6 mm and 2 mm at inferior pole.  Questionable tiny calculus at upper pole LEFT kidney.  No definite RIGHT side renal calculi.  Small RIGHT pelvic phleboliths stable with an additional calcification in the inferior RIGHT pelvis, 5 mm diameter, new, could represent a phlebolith or a distal RIGHT ureteral calculus at or near the ureterovesical junction.  Bowel gas pattern normal.  Osseous structures unremarkable.  IMPRESSION: LEFT renal calculi.  Question new 5 mm phlebolith versus distal RIGHT ureteral calculus in inferior RIGHT pelvis.   Electronically Signed By: Ulyses Southward M.D. On: 02/05/2023 16:15  No results found for this or any previous visit.  No results found for this or any previous visit.  No results found for this or any previous visit.  Results for orders placed during the hospital encounter of 07/05/22  Ultrasound renal complete  Narrative CLINICAL DATA:  Follow-up left renal calculi.  EXAM: RENAL / URINARY TRACT ULTRASOUND COMPLETE  COMPARISON:  CT scan of the abdomen and pelvis May 28, 2022.  FINDINGS: Right Kidney:  Renal measurements: 13.7 x  4.9 x 5.4 cm = volume: 186 mL. Echogenicity within normal limits. No mass or hydronephrosis visualized.  Left Kidney:  Renal measurements: 30.3 x 4.9 x 6.2 cm = volume: 212 mL. Contains a 1.4 cm cyst. No follow-up imaging recommended for the cyst. Contains an 8 mm nonobstructive stone.  Bladder:  Appears normal for degree of bladder distention.  Other:  None.  IMPRESSION: 1. There is an 8 mm nonobstructive stone in the left kidney. 2. The kidneys are otherwise unremarkable. 3. The bladder is normal.   Electronically Signed By: Gerome Sam III M.D. On: 07/05/2022 16:50  No valid procedures specified. No results found for this or any previous visit.  Results for orders placed during the hospital encounter of 05/28/22  CT RENAL STONE STUDY  Narrative CLINICAL DATA:  Left lower quadrant pain.  EXAM: CT ABDOMEN AND PELVIS WITHOUT CONTRAST  TECHNIQUE: Multidetector CT imaging of the abdomen and pelvis was performed following the standard protocol without IV contrast.  RADIATION DOSE REDUCTION: This exam was performed according to the departmental dose-optimization program which includes automated exposure control, adjustment of the mA and/or kV according to patient size and/or use of iterative reconstruction technique.  COMPARISON:  April 29, 2018  FINDINGS: Lower chest: No acute abnormality.  Hepatobiliary: No focal liver abnormality is seen. No gallstones, gallbladder wall thickening, or biliary dilatation.  Pancreas: Unremarkable. No pancreatic ductal dilatation or surrounding inflammatory changes.  Spleen: Normal in size without focal abnormality.  Adrenals/Urinary Tract: Adrenal glands are unremarkable. Kidneys are normal in size, without focal lesions. A 3 mm nonobstructing renal calculus is seen within the posterior aspect of the mid to lower left kidney. An additional 3 mm obstructing renal calculus is seen at the left UVJ. Mild to moderate  severity left-sided hydronephrosis and hydroureter are seen. The urinary bladder is poorly distended and subsequently limited in evaluation.  Stomach/Bowel: Stomach is within normal limits. The appendix is not clearly identified. No evidence of bowel wall thickening, distention, or inflammatory changes. Noninflamed diverticula are seen throughout the sigmoid colon.  Vascular/Lymphatic: No significant vascular findings are present. No enlarged abdominal or pelvic lymph nodes.  Reproductive: Status post hysterectomy. No adnexal masses.  Other: No abdominal wall hernia or abnormality. No abdominopelvic ascites.  Musculoskeletal: No acute or significant osseous findings.  IMPRESSION: 1. 3 mm obstructing renal calculus  at the left UVJ. 2. 3 mm nonobstructing left renal calculus. 3. Sigmoid diverticulosis.   Electronically Signed By: Aram Candela M.D. On: 05/28/2022 05:03   Assessment & Plan:    1. Kidney stones *** - Urinalysis, Routine w reflex microscopic  2. OAB (overactive bladder) ***   No follow-ups on file.  Wilkie Aye, MD  [facility] [place]

## 2023-07-09 ENCOUNTER — Encounter: Payer: Self-pay | Admitting: Urology

## 2023-07-09 NOTE — Patient Instructions (Signed)

## 2023-07-24 ENCOUNTER — Ambulatory Visit: Payer: 59 | Admitting: Pharmacist

## 2023-07-24 ENCOUNTER — Other Ambulatory Visit: Payer: 59

## 2023-07-24 ENCOUNTER — Inpatient Hospital Stay: Payer: 59 | Attending: Hematology and Oncology

## 2023-07-24 ENCOUNTER — Inpatient Hospital Stay: Payer: 59 | Admitting: Pharmacist

## 2023-07-24 VITALS — BP 121/73 | HR 82 | Temp 97.5°F | Resp 17 | Wt 260.0 lb

## 2023-07-24 DIAGNOSIS — C50112 Malignant neoplasm of central portion of left female breast: Secondary | ICD-10-CM | POA: Diagnosis present

## 2023-07-24 DIAGNOSIS — Z79811 Long term (current) use of aromatase inhibitors: Secondary | ICD-10-CM | POA: Insufficient documentation

## 2023-07-24 DIAGNOSIS — Z170 Estrogen receptor positive status [ER+]: Secondary | ICD-10-CM | POA: Diagnosis not present

## 2023-07-24 LAB — CBC WITH DIFFERENTIAL (CANCER CENTER ONLY)
Abs Immature Granulocytes: 0.02 10*3/uL (ref 0.00–0.07)
Basophils Absolute: 0 10*3/uL (ref 0.0–0.1)
Basophils Relative: 1 %
Eosinophils Absolute: 0.1 10*3/uL (ref 0.0–0.5)
Eosinophils Relative: 2 %
HCT: 36.7 % (ref 36.0–46.0)
Hemoglobin: 11.9 g/dL — ABNORMAL LOW (ref 12.0–15.0)
Immature Granulocytes: 0 %
Lymphocytes Relative: 21 %
Lymphs Abs: 0.9 10*3/uL (ref 0.7–4.0)
MCH: 29.3 pg (ref 26.0–34.0)
MCHC: 32.4 g/dL (ref 30.0–36.0)
MCV: 90.4 fL (ref 80.0–100.0)
Monocytes Absolute: 0.3 10*3/uL (ref 0.1–1.0)
Monocytes Relative: 7 %
Neutro Abs: 3.1 10*3/uL (ref 1.7–7.7)
Neutrophils Relative %: 69 %
Platelet Count: 128 10*3/uL — ABNORMAL LOW (ref 150–400)
RBC: 4.06 MIL/uL (ref 3.87–5.11)
RDW: 13.9 % (ref 11.5–15.5)
WBC Count: 4.5 10*3/uL (ref 4.0–10.5)
nRBC: 0 % (ref 0.0–0.2)

## 2023-07-24 LAB — CMP (CANCER CENTER ONLY)
ALT: 14 U/L (ref 0–44)
AST: 14 U/L — ABNORMAL LOW (ref 15–41)
Albumin: 3.7 g/dL (ref 3.5–5.0)
Alkaline Phosphatase: 61 U/L (ref 38–126)
Anion gap: 4 — ABNORMAL LOW (ref 5–15)
BUN: 14 mg/dL (ref 8–23)
CO2: 30 mmol/L (ref 22–32)
Calcium: 9.1 mg/dL (ref 8.9–10.3)
Chloride: 106 mmol/L (ref 98–111)
Creatinine: 0.84 mg/dL (ref 0.44–1.00)
GFR, Estimated: >60 mL/min (ref >60)
Glucose, Bld: 110 mg/dL — ABNORMAL HIGH (ref 70–99)
Potassium: 4.4 mmol/L (ref 3.5–5.1)
Sodium: 140 mmol/L (ref 135–145)
Total Bilirubin: 0.6 mg/dL (ref 0.3–1.2)
Total Protein: 6.3 g/dL — ABNORMAL LOW (ref 6.5–8.1)

## 2023-07-24 NOTE — Progress Notes (Signed)
[facility]       Telephone: [phone]?Fax: [phone]   Oncology Clinical Pharmacist Practitioner Progress Note  Kelli Estrada was contacted via in-person to discuss her chemotherapy regimen for abemaciclib which they receive under the care of Dr. Rachel Moulds.   Current treatment regimen and start date Abemaciclib (12/28/22) Anastrozole (12/31/22)   Interval History She continues on abemaciclib 100 mg by mouth every 12 hours on days 1 to 28 of a 28-day cycle. This is being given in combination with anastrozole. Therapy is planned to continue until two years in the adjuvant setting per the monarchE trial data. She was seen today as a follow up to her abemaciclib management. Kelli Estrada was last seen by clinical pharmacy on 05/29/23 and Dr. Al Pimple on 06/27/23. She had one episode of blood clots in her stool and vomiting. Colonoscopy showed a hemorrhoid but no other findings.   Response to Therapy Ms. Amir continues to tolerate abemaciclib well. She is having minimal nausea and diarrhea. She does report missing several doses over the past few weeks and we highly encouraged her to set up alerts on her smart phone to remind her on when to take the abemaciclib. She normally takes it at 7 am / 7 pm. We also inquired about a DEXA scan since she is on anastrozole. She does report having osteoporosis and being on alendronate. She states her pain management doctor, Dr. Massie Maroon from [facility], usually handles this scan. We could not find a record in the chart but Kelli Estrada said she will have them fax over the results. We discussed that it is recommended to have this done yearly if she has osteoporosis. She verbalized understanding of the plan.  She will see Dr. Al Pimple with labs on 08/21/23 and we will see her back in 3 months. She has her mammogram with [facility] today at 330 pm. Labs, vitals, treatment parameters, and manufacturer guidelines assessing toxicity were reviewed with  Norm Parcel today. Based on these values, patient is in agreement to continue abemaciclib therapy at this time.  Allergies No Known Allergies  Vitals    07/24/2023    1:12 PM 07/08/2023   11:47 AM 06/27/2023    2:26 PM  Oncology Vitals  Weight 117.935 kg  115.985 kg  Weight (lbs) 260 lbs  255 lbs 11 oz  BMI 44.94 kg/m2  44.19 kg/m2  Temp 97.5 F (36.4 C)  97.6 F (36.4 C)  Pulse Rate 82 93 64  BP 121/73 105/72 119/76  Resp 17  19  SpO2 96 %  99 %  BSA (m2) 2.3 m2  2.28 m2    Laboratory Data    Latest Ref Rng & Units 07/24/2023    1:00 PM 06/27/2023    2:06 PM 05/29/2023    1:57 PM  CBC EXTENDED  WBC 4.0 - 10.5 K/uL 4.5  3.2  5.4   RBC 3.87 - 5.11 MIL/uL 4.06  4.30  4.58   Hemoglobin 12.0 - 15.0 g/dL 42.5  95.6  38.7   HCT 36.0 - 46.0 % 36.7  38.1  41.1   Platelets 150 - 400 K/uL 128  116  152   NEUT# 1.7 - 7.7 K/uL 3.1  2.1  3.7   Lymph# 0.7 - 4.0 K/uL 0.9  0.8  1.0        Latest Ref Rng & Units 07/24/2023    1:00 PM 06/27/2023    2:06 PM 05/29/2023    1:57 PM  CMP  Glucose 70 - 99 mg/dL 782  956  93   BUN 8 - 23 mg/dL 14  8  8    Creatinine 0.44 - 1.00 mg/dL 2.13  0.86  5.78   Sodium 135 - 145 mmol/L 140  142  140   Potassium 3.5 - 5.1 mmol/L 4.4  3.7  4.1   Chloride 98 - 111 mmol/L 106  108  106   CO2 22 - 32 mmol/L 30  27  27    Calcium 8.9 - 10.3 mg/dL 9.1  9.0  8.9   Total Protein 6.5 - 8.1 g/dL 6.3  6.5  6.6   Total Bilirubin 0.3 - 1.2 mg/dL 0.6  0.5  0.4   Alkaline Phos 38 - 126 U/L 61  50  58   AST 15 - 41 U/L 14  13  13    ALT 0 - 44 U/L 14  12  14      Adverse Effects Assessment Nausea: controlled Diarrhea: controlled  Adherence Assessment Kelli Estrada reports missing 7 doses over the past 4 weeks.   Reason for missed dose: forgot -- as above, showed her how to set up repeat alarms on smartphone for 7 am / 7 pm Patient was re-educated on importance of adherence.   Access Assessment Wafaa Deemer is currently receiving her abemaciclib through [facility] concerns:  none  Medication Reconciliation The patient's medication list was reviewed today with the patient? Yes New medications or herbal supplements have recently been started? No  Any medications have been discontinued? No  The medication list was updated and reconciled based on the patient's most recent medication list in the electronic medical record (EMR) including herbal products and OTC medications.   Medications Current Outpatient Medications  Medication Sig Dispense Refill   abemaciclib (VERZENIO) 100 MG tablet Take 1 tablet (100 mg total) by mouth 2 (two) times daily. 56 tablet 1   albuterol (VENTOLIN HFA) 108 (90 Base) MCG/ACT inhaler SMARTSIG:1 Puff(s) Via Inhaler 4 Times Daily PRN     alendronate (FOSAMAX) 70 MG tablet Take 70 mg by mouth once a week.     anastrozole (ARIMIDEX) 1 MG tablet Take 1 tablet (1 mg total) by mouth daily. 90 tablet 3   Blood Glucose Monitoring Suppl (ONETOUCH VERIO REFLECT) w/Device KIT See admin instructions.     Cholecalciferol (VITAMIN D3) 250 MCG (10000 UT) capsule Take 10,000 Units by mouth daily.     [employer]S VITAMIN E 180 MG (400 UNIT) CAPS Take 2 capsules by mouth daily.     fenofibrate (TRICOR) 48 MG tablet Take 40 mg by mouth daily.     Fluticasone-Umeclidin-Vilant (TRELEGY ELLIPTA) 100-62.5-25 MCG/ACT AEPB Inhale 100 mcg into the lungs 1 day or 1 dose.     gabapentin (NEURONTIN) 300 MG capsule Take 300 mg by mouth 3 (three) times daily.     Ginger 500 MG CAPS Take by mouth.     hydrOXYzine (VISTARIL) 25 MG capsule Take 25 mg by mouth 2 (two) times daily as needed for anxiety.   0   loperamide (IMODIUM) 2 MG capsule Take 2 mg by mouth as needed for diarrhea or loose stools.     mirabegron ER (MYRBETRIQ) 25 MG TB24 tablet Take 1 tablet (25 mg total) by mouth daily.     Misc Natural Products (OSTEO BI-FLEX JOINT SHIELD PO) Take by mouth daily.     omeprazole (PRILOSEC OTC) 20 MG tablet Take 20 mg by mouth daily.  ondansetron (ZOFRAN) 8 MG tablet Take 1 tablet (8 mg total) by mouth every 8 (eight) hours as needed for nausea or vomiting. 30 tablet 2   Oxcarbazepine (TRILEPTAL) 300 MG tablet Take 300 mg by mouth 2 (two) times daily.     oxyCODONE-acetaminophen (PERCOCET/ROXICET) 5-325 MG tablet Take 1 tablet by mouth every 6 (six) hours as needed (For pain.).      OZEMPIC, 1 MG/DOSE, 4 MG/3ML SOPN Inject 1 mg into the skin once a week.     prochlorperazine (COMPAZINE) 10 MG tablet Take 1 tablet (10 mg total) by mouth every 6 (six) hours as needed for nausea or vomiting. 30 tablet 2   rosuvastatin (CRESTOR) 20 MG tablet Take 20 mg by mouth at bedtime.     sertraline (ZOLOFT) 100 MG tablet Take 100 mg by mouth 2 (two) times daily.  2   tamsulosin (FLOMAX) 0.4 MG CAPS capsule Take 1 capsule (0.4 mg total) by mouth daily. 90 capsule 3   topiramate (TOPAMAX) 100 MG tablet Take 100 mg by mouth daily.     No current facility-administered medications for this visit.    Drug-Drug Interactions (DDIs) DDIs were evaluated? Yes Significant DDIs? No  The patient was instructed to speak with their health care provider and/or the oral chemotherapy pharmacist before starting any new drug, including prescription or over the counter, natural / herbal products, or vitamins.  Supportive Care Diarrhea: we reviewed that diarrhea is common with abemaciclib and confirmed that she does have loperamide (Imodium) at home.  We reviewed how to take this medication PRN. Neutropenia: we discussed the importance of having a thermometer and what the Centers for Disease Control and Prevention (CDC) considers a fever which is 100.28F (38C) or higher.  Gave patient 24/7 triage line to call if any fevers or symptoms. ILD/Pneumonitis: we reviewed potential symptoms including cough, shortness, and fatigue.  VTE: reviewed signs of DVT such as leg swelling, redness, pain, or tenderness and signs of PE such as shortness of breath, rapid or  irregular heartbeat, cough, chest pain, or lightheadedness. Reviewed to take the medication every 12 hours (with food sometimes can be easier on the stomach) and to take it at the same time every day. Hepatotoxicity:WNL Drug interactions with grapefruit products  Dosing Assessment Hepatic adjustments needed? No  Renal adjustments needed? No  Toxicity adjustments needed? No  The current dosing regimen is appropriate to continue at this time.  Follow-Up Plan Continue abemaciclib 100 mg by mouth every 12 hours Continue anastrozole 1 mg by mouth daily Use loperamide (Imodium) by mouth as needed for loose stool Nausea improved, diarrhea improved She will set two alarms on her smartphone as she has had some issues with adherence lately per her report. She takes her abemaciclib at 7 am / 7 pm She will continue to be followed by her pain management doctor Dr. Massie Maroon from [facility] who Ms. Poncedeleon states is doing DEXA scans. Patient will have office fax over results. Recommends are to be done yearly since she has osteoporosis and on anastrozole. She is on alendronate, vitamin d. She sees Dr. Massie Maroon monthly Her other comorbidities are managed by Dr. Malen Gauze from [facility]. She sees Dr. Malen Gauze every 3-6 months per her report. We will add labs, pharmacy clinic visit, in 3 months She will see Dr. Al Pimple with labs on 08/21/23 Mammogram is today at [facility] participated in the discussion, expressed understanding, and voiced agreement with the above plan. All questions were answered to  her satisfaction. The patient was advised to contact the clinic at [phone] with any questions or concerns prior to her return visit.   I spent 60 minutes assessing and educating the patient.  Honour Schwieger A. Odetta Pink, PharmD, BCOP, CPP  Anselm Lis, RPH-CPP, 07/24/2023  1:10 PM   **Disclaimer: This note was dictated with voice recognition software. Similar sounding words can inadvertently be  transcribed and this note may contain transcription errors which may not have been corrected upon publication of note.**

## 2023-08-01 ENCOUNTER — Ambulatory Visit: Payer: 59 | Attending: Nurse Practitioner | Admitting: Physical Therapy

## 2023-08-01 ENCOUNTER — Encounter: Payer: Self-pay | Admitting: Physical Therapy

## 2023-08-01 DIAGNOSIS — M5459 Other low back pain: Secondary | ICD-10-CM

## 2023-08-01 DIAGNOSIS — R262 Difficulty in walking, not elsewhere classified: Secondary | ICD-10-CM | POA: Diagnosis not present

## 2023-08-01 DIAGNOSIS — M5440 Lumbago with sciatica, unspecified side: Secondary | ICD-10-CM | POA: Diagnosis not present

## 2023-08-01 DIAGNOSIS — M6281 Muscle weakness (generalized): Secondary | ICD-10-CM | POA: Diagnosis present

## 2023-08-01 NOTE — Therapy (Signed)
OUTPATIENT PHYSICAL THERAPY THORACOLUMBAR EVALUATION   Patient Name: Kelli Estrada MRN: [id number] DOB:[birth year], 62 y.o., female Today's Date: 08/01/2023  END OF SESSION:  PT End of Session - 08/01/23 1234     Visit Number 1    Number of Visits 12    Date for PT Re-Evaluation 09/12/23    Authorization Type [employer] DUAL COMPLETE    PT Start Time 1232    PT Stop Time 1310    PT Time Calculation (min) 38 min    Activity Tolerance Patient tolerated treatment well    Behavior During Therapy [facility] for tasks assessed/performed             Past Medical History:  Diagnosis Date   Anxiety    Chronic pain    Depression    Family history of breast cancer 08/23/2022   H/O degenerative disc disease    History of radiation therapy    Left breast- 10/25/22-12/11/22- Dr. Antony Blackbird   History of radiation therapy    Left breast-10/25/22-12/11/22- Dr. Antony Blackbird   Pre-diabetes    PTSD (post-traumatic stress disorder)    Sleep apnea    Past Surgical History:  Procedure Laterality Date   ABDOMINAL HYSTERECTOMY     BREAST LUMPECTOMY WITH SENTINEL LYMPH NODE BIOPSY Left 09/14/2022   Procedure: LEFT BREAST CENTRAL LUMPECTOMY WITH SENTINEL LYMPH NODE BX;  Surgeon: Griselda Miner, MD;  Location:  SURGERY CENTER;  Service: General;  Laterality: Left;   COLONOSCOPY WITH PROPOFOL N/A 03/15/2016   Procedure: COLONOSCOPY WITH PROPOFOL;  Surgeon: Dorena Cookey, MD;  Location: [facility];  Service: Endoscopy;  Laterality: N/A;   NECK SURGERY     Patient Active Problem List   Diagnosis Date Noted   Genetic testing 08/27/2022   Family history of breast cancer 08/23/2022   Malignant neoplasm of central portion of left breast in female, estrogen receptor positive (HCC) 08/20/2022   Chronic pain of left knee 06/03/2015   Depression, major, recurrent, moderate (HCC) 06/03/2015   Morbid obesity with body mass index of 50 or higher (HCC) 06/03/2015   Vitamin D deficiency 06/03/2015    PTSD (post-traumatic stress disorder) 06/03/2015   Obstructive sleep apnea 06/03/2015    REFERRING PROVIDER: Courtney Paris, NP   REFERRING DIAG: Lumbago with sciatica, unspecified side [M54.40]   Rationale for Evaluation and Treatment: Rehabilitation  THERAPY DIAG:  Other low back pain  Muscle weakness (generalized)  Difficulty in walking, not elsewhere classified  ONSET DATE: Several years.   SUBJECTIVE:  SUBJECTIVE STATEMENT: Pt states that she has chronic lower back pain that has recently gotten worse. She reports a recent onset of bilat knee and sciatica. Recent diagnosis of osteoporosis and osteoarthritis per pt. She has received PT in the past and benefited from aquatic therapy.  PERTINENT HISTORY:  Anxiety, Depression, DDD, Pre diabetes, PTSD, Sleep apnea, hx of breast cancer.   PAIN:  Are you having pain? Yes: NPRS scale: 7-8/10 Pain location: Throughout lower back and into bilat LE's.  Pain description: Sharp pain Aggravating factors: Walking, doing activities around the house. Sitting for prolonged periods of time.  Relieving factors: Nothing at this time.   PRECAUTIONS: None  RED FLAGS: None   WEIGHT BEARING RESTRICTIONS: No  FALLS:  Has patient fallen in last 6 months? 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   OBJECTIVE:  Note: Objective measures were completed at Evaluation unless otherwise noted.  DIAGNOSTIC FINDINGS:  None in chart. [facility] per pt.   PATIENT  SURVEYS:  ODI: 29/50 58%   SCREENING FOR RED FLAGS: Bowel or bladder incontinence: Yes: incontinence   COGNITION: Overall cognitive status: Within functional limits for tasks assessed     SENSATION: WFL  POSTURE: rounded shoulders and decreased lumbar lordosis  PALPATION: No tenderness to   LUMBAR ROM:   Overall ROM is WFL with pt reporting pain with movement. Hip hinge with lumbar flexion.   LOWER EXTREMITY ROM:     Overall ROM is WFL with pt reporting pain with movement.   LOWER EXTREMITY MMT:    MMT Right eval Left eval  Hip flexion 4 4+  Knee flexion 4+ 4+  Knee extension 4+ 4+   (Blank rows = not tested)  FUNCTIONAL TESTS:  5 times sit to stand: 19.84 sec Timed up and go (TUG): 19.43 sec with SPC.   GAIT: Distance walked: 38ft Assistive device utilized: Single point cane Level of assistance: Complete Independence Comments: Pt walks with decreased stride and use of her AD.   TODAY'S TREATMENT:                                                                                                                              DATE: Creating, reviewing, and completing below HEP    PATIENT EDUCATION:  Education details: Educated pt on anatomy and physiology of current symptoms, FOTO, diagnosis, prognosis, HEP,  and POC. Person educated: Patient Education method: Explanation Education comprehension: verbalized understanding  HOME EXERCISE PROGRAM: Access Code: [email or web address] URL: [email or web address] Date: 08/01/2023 Prepared by: Royal Hawthorn  Exercises - Supine Lower Trunk Rotation  - 2 x daily - 7 x weekly - 2 sets - 10 reps - Seated Hamstring Stretch  -  2 x daily - 7 x weekly - 2 sets - 2 reps - 30 hold - Supine Pelvic Tilt  - 1 x daily - 7 x weekly - 3 sets - 10 reps - Supine Transversus Abdominis Bracing - Hands on Stomach  - 1 x daily - 7 x weekly - 2 sets - 10 reps - 10 hold  ASSESSMENT:  CLINICAL IMPRESSION: Patient referred to PT for  chronic lower back and LE pain. She has been in and out of PT for various reasons and noted improvements with aquatic therapy. Pt ambulates with an antalgic gait pattern with use of SPC. She denies any recent falls, but reports noted instability. She reports pain with functional ROM, but is able to move freely in clinic during eval. Patient will benefit from skilled PT to address below impairments, limitations and improve overall function.  OBJECTIVE IMPAIRMENTS: decreased activity tolerance, difficulty walking, decreased balance, decreased endurance, decreased mobility, decreased ROM, decreased strength, impaired flexibility, impaired UE/LE use, postural dysfunction, and pain.  ACTIVITY LIMITATIONS: bending, lifting, carry, locomotion, cleaning, community activity, driving, and or occupation  PERSONAL FACTORS: Anxiety, Depression, DDD, Pre diabetes, PTSD, Sleep apnea, hx of breast cancer.  are also affecting patient's functional outcome.  REHAB POTENTIAL: Good  CLINICAL DECISION MAKING: Evolving/moderate complexity  EVALUATION COMPLEXITY: Moderate    GOALS: Short term PT Goals Target date: 08/15/2023 Pt will be I and compliant with HEP. Baseline:  Goal status: New Pt will decrease pain by 25% overall Baseline: Goal status: New  Long term PT goals Target date: 09/12/2023 Pt will improve ROM to [facility] with 4/10 pain to improve functional mobility Baseline: Goal status: New Pt will improve  hip/knee strength to at least 5-/5 MMT to improve functional strength Baseline: Goal status: New Pt will improve ODI to at least 15 points functional to show improved function Baseline: Goal status: New Pt will reduce pain by overall 50% overall with usual activity Baseline: Goal status: New Pt will decrease TUG to below 14 seconds in order to demonstrate decreased fall risk Pt will decrease STS by  3.6 seconds in order to demonstrate decreased fall risk.  Pt will be able to ambulate community  distances at least 1000 ft with AD and gait pattern without complaints Baseline: Goal status: New  PLAN: PT FREQUENCY: 1-2 times per week   PT DURATION: 6 weeks  PLANNED INTERVENTIONS (unless contraindicated): aquatic PT, Canalith repositioning, cryotherapy, Electrical stimulation, Iontophoresis with 4 mg/ml dexamethasome, Moist heat, traction, Ultrasound, gait training, Therapeutic exercise, balance training, neuromuscular re-education, patient/family education, prosthetic training, manual techniques, passive ROM, dry needling, taping, vasopnuematic device, vestibular, spinal manipulations, joint manipulations  PLAN FOR NEXT SESSION: Review HEP, strengthen proximal hip musculature, stretching. Core strengthening, gait and balance.    Champ Mungo, PT 08/01/2023, 1:09 PM

## 2023-08-02 ENCOUNTER — Other Ambulatory Visit: Payer: Self-pay

## 2023-08-02 ENCOUNTER — Ambulatory Visit: Payer: 59 | Admitting: Physical Therapy

## 2023-08-02 ENCOUNTER — Other Ambulatory Visit: Payer: Self-pay | Admitting: Hematology and Oncology

## 2023-08-02 ENCOUNTER — Encounter: Payer: Self-pay | Admitting: Physical Therapy

## 2023-08-02 DIAGNOSIS — R262 Difficulty in walking, not elsewhere classified: Secondary | ICD-10-CM

## 2023-08-02 DIAGNOSIS — M6281 Muscle weakness (generalized): Secondary | ICD-10-CM

## 2023-08-02 DIAGNOSIS — M5440 Lumbago with sciatica, unspecified side: Secondary | ICD-10-CM | POA: Diagnosis not present

## 2023-08-02 DIAGNOSIS — M5459 Other low back pain: Secondary | ICD-10-CM

## 2023-08-02 NOTE — Progress Notes (Signed)
Specialty Pharmacy Refill Coordination Note  Kelli Estrada is a 62 y.o. female contacted today regarding refills of specialty medication(s) Abemaciclib   Patient requested Delivery   Delivery date: 08/12/23   Verified address: [address] [place] [address]   Medication will be filled on 08/09/23 pending refill request.

## 2023-08-02 NOTE — Therapy (Signed)
OUTPATIENT PHYSICAL THERAPY THORACOLUMBAR PROGRESS NOTE   Patient Name: Kelli Estrada MRN: [id number] DOB:May 20, 1961, 62 y.o., female Today's Date: 08/02/2023  END OF SESSION:  PT End of Session - 08/02/23 1656     Visit Number 2    Number of Visits 12    Date for PT Re-Evaluation 09/12/23    Authorization Type [employer] DUAL COMPLETE    PT Start Time 1430    PT Stop Time 1515    PT Time Calculation (min) 45 min    Activity Tolerance Patient tolerated treatment well    Behavior During Therapy [facility] for tasks assessed/performed             Past Medical History:  Diagnosis Date   Anxiety    Chronic pain    Depression    Family history of breast cancer 08/23/2022   H/O degenerative disc disease    History of radiation therapy    Left breast- 10/25/22-12/11/22- Dr. Antony Blackbird   History of radiation therapy    Left breast-10/25/22-12/11/22- Dr. Antony Blackbird   Pre-diabetes    PTSD (post-traumatic stress disorder)    Sleep apnea    Past Surgical History:  Procedure Laterality Date   ABDOMINAL HYSTERECTOMY     BREAST LUMPECTOMY WITH SENTINEL LYMPH NODE BIOPSY Left 09/14/2022   Procedure: LEFT BREAST CENTRAL LUMPECTOMY WITH SENTINEL LYMPH NODE BX;  Surgeon: Griselda Miner, MD;  Location: [facility];  Service: General;  Laterality: Left;   COLONOSCOPY WITH PROPOFOL N/A 03/15/2016   Procedure: COLONOSCOPY WITH PROPOFOL;  Surgeon: Dorena Cookey, MD;  Location: [facility];  Service: Endoscopy;  Laterality: N/A;   NECK SURGERY     Patient Active Problem List   Diagnosis Date Noted   Genetic testing 08/27/2022   Family history of breast cancer 08/23/2022   Malignant neoplasm of central portion of left breast in female, estrogen receptor positive (HCC) 08/20/2022   Chronic pain of left knee 06/03/2015   Depression, major, recurrent, moderate (HCC) 06/03/2015   Morbid obesity with body mass index of 50 or higher (HCC) 06/03/2015   Vitamin D deficiency  06/03/2015   PTSD (post-traumatic stress disorder) 06/03/2015   Obstructive sleep apnea 06/03/2015    REFERRING PROVIDER: Courtney Paris, NP   REFERRING DIAG: Lumbago with sciatica, unspecified side [M54.40]   Rationale for Evaluation and Treatment: Rehabilitation  THERAPY DIAG:  Other low back pain  Muscle weakness (generalized)  Difficulty in walking, not elsewhere classified  ONSET DATE: Several years.   SUBJECTIVE:  SUBJECTIVE STATEMENT: My knees hurt a lot today.  PERTINENT HISTORY:  Anxiety, Depression, DDD, Pre diabetes, PTSD, Sleep apnea, hx of breast cancer.   PAIN:  Are you having pain? Yes: NPRS scale: 9/10 Pain location: Throughout lower back and into bilat LE's.  Pain description: Sharp pain Aggravating factors: Walking, doing activities around the house. Sitting for prolonged periods of time.  Relieving factors: Nothing at this time.   PRECAUTIONS: None  RED FLAGS: None   WEIGHT BEARING RESTRICTIONS: No  FALLS:  Has patient fallen in last 6 months? 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   OBJECTIVE:  Note: Objective measures were completed at Evaluation unless otherwise noted.  DIAGNOSTIC FINDINGS:  None in chart. [facility] per pt.   PATIENT SURVEYS:  ODI: 29/50 58%   SCREENING FOR RED FLAGS: Bowel or bladder incontinence: Yes: incontinence   COGNITION: Overall cognitive status: Within functional limits for tasks assessed     SENSATION: WFL  POSTURE:  rounded shoulders and decreased lumbar lordosis  PALPATION: No tenderness to   LUMBAR ROM:   Overall ROM is WFL with pt reporting pain with movement. Hip hinge with lumbar flexion.   LOWER EXTREMITY ROM:     Overall ROM is WFL with pt reporting pain with movement.   LOWER EXTREMITY MMT:    MMT Right eval Left eval  Hip flexion 4 4+  Knee flexion 4+ 4+  Knee extension 4+ 4+   (Blank rows = not tested)  FUNCTIONAL TESTS:  5 times sit to stand: 19.84 sec Timed up and go (TUG): 19.43 sec with SPC.   GAIT: Distance walked: 57ft Assistive device utilized: Single point cane Level of assistance: Complete Independence Comments: Pt walks with decreased stride and use of her AD.   TODAY'S TREATMENT:                                                                                                                              DATE:  08/02/23: Pt arrives for aquatic physical therapy. Treatment took place in 3.5-5.5 feet of water. Water temperature was 90 degrees F. Pt entered the pool via stairs, step to step  with heavy use of the rails. Pt requires buoyancy of water for support and to offload joints with strengthening exercises.  Seated water bench with 75% submersion Pt performed seated LE AROM exercises 20x in all planes, concurrent education on water principles (review) and pain assessment. 75% water walking 4x in each direction holding the single buoy hand wts. Bil hip kicks 10x in each direction, pt requires holding onto pool wall for balance. Standing heel lifts 2x10, Seated hamstring stretch 2x 20 sec Bil. Small squat  standing against the wall as pt performed shoulder flex/ext with TA activation.   Creating, reviewing, and completing below HEP    PATIENT EDUCATION:  Education details: Educated pt on anatomy and physiology of current symptoms, FOTO, diagnosis, prognosis, HEP,  and POC. Person educated: Patient Education method: Explanation Education comprehension: verbalized  understanding  HOME EXERCISE PROGRAM: Access Code: [email or web address] URL: [email or web address] Date: 08/01/2023 Prepared by: Royal Hawthorn  Exercises - Supine Lower Trunk Rotation  - 2 x daily - 7 x weekly - 2 sets - 10 reps - Seated Hamstring Stretch  - 2 x daily - 7 x weekly - 2 sets - 2 reps - 30 hold - Supine Pelvic Tilt  - 1 x daily - 7 x weekly - 3 sets - 10 reps - Supine Transversus Abdominis Bracing - Hands on Stomach  - 1 x daily - 7 x weekly - 2 sets - 10 reps - 10 hold  ASSESSMENT:  CLINICAL IMPRESSION: Pt arrives for her first aquatic session at [facility] Pt has had previous aquatic PT at another facility but did not follow up at the [facility] like she had planned to and consequently regressed. Pt initially "felt" her knees as she walked but this lessened throughout the session. Pt had moderate LE fatigue at end of session.  OBJECTIVE IMPAIRMENTS: decreased activity tolerance, difficulty walking, decreased balance, decreased endurance, decreased mobility, decreased ROM, decreased strength, impaired flexibility, impaired UE/LE use, postural dysfunction, and pain.  ACTIVITY LIMITATIONS: bending, lifting, carry, locomotion, cleaning, community activity, driving, and or occupation  PERSONAL FACTORS: Anxiety, Depression, DDD, Pre diabetes, PTSD, Sleep apnea, hx of breast cancer.  are also affecting patient's functional outcome.  REHAB POTENTIAL: Good  CLINICAL DECISION MAKING: Evolving/moderate complexity  EVALUATION COMPLEXITY: Moderate    GOALS: Short term PT Goals Target date: 08/15/2023 Pt will be I and compliant with HEP. Baseline:  Goal status: New Pt will decrease pain by 25% overall Baseline: Goal status: New  Long term PT goals Target date: 09/12/2023 Pt will improve ROM to [facility] with 4/10 pain to improve functional mobility Baseline: Goal status: New Pt will improve  hip/knee strength to at least 5-/5 MMT to improve functional strength Baseline: Goal  status: New Pt will improve ODI to at least 15 points functional to show improved function Baseline: Goal status: New Pt will reduce pain by overall 50% overall with usual activity Baseline: Goal status: New Pt will decrease TUG to below 14 seconds in order to demonstrate decreased fall risk Pt will decrease STS by  3.6 seconds in order to demonstrate decreased fall risk.  Pt will be able to ambulate community distances at least 1000 ft with AD and gait pattern without complaints Baseline: Goal status: New  PLAN: PT FREQUENCY: 1-2 times per week   PT DURATION: 6 weeks  PLANNED INTERVENTIONS (unless contraindicated): aquatic PT, Canalith repositioning, cryotherapy, Electrical stimulation, Iontophoresis with 4 mg/ml dexamethasome, Moist heat, traction, Ultrasound, gait training, Therapeutic exercise, balance training, neuromuscular re-education, patient/family education, prosthetic training, manual techniques, passive ROM, dry needling, taping, vasopnuematic device, vestibular, spinal manipulations, joint manipulations  PLAN FOR NEXT SESSION: Review HEP, strengthen proximal hip musculature, stretching. Core strengthening, gait and balance. See how pt tolerated aquatics.   Laqueisha Catalina, PTA 08/02/2023, 4:57 PM

## 2023-08-05 ENCOUNTER — Other Ambulatory Visit: Payer: Self-pay

## 2023-08-05 MED ORDER — ABEMACICLIB 100 MG PO TABS
100.0000 mg | ORAL_TABLET | Freq: Two times a day (BID) | ORAL | 1 refills | Status: DC
  Filled 2023-08-05: qty 56, 28d supply, fill #0
  Filled 2023-09-10: qty 56, 28d supply, fill #1

## 2023-08-07 ENCOUNTER — Other Ambulatory Visit: Payer: Self-pay

## 2023-08-07 ENCOUNTER — Ambulatory Visit: Payer: 59 | Admitting: Urology

## 2023-08-07 DIAGNOSIS — N3281 Overactive bladder: Secondary | ICD-10-CM

## 2023-08-07 MED ORDER — MIRABEGRON ER 25 MG PO TB24: 25.0000 mg | ORAL_TABLET | Freq: Every day | ORAL | 11 refills | Status: AC

## 2023-08-08 ENCOUNTER — Ambulatory Visit: Payer: 59 | Admitting: Physical Therapy

## 2023-08-08 ENCOUNTER — Encounter: Payer: Self-pay | Admitting: Hematology and Oncology

## 2023-08-08 DIAGNOSIS — R262 Difficulty in walking, not elsewhere classified: Secondary | ICD-10-CM

## 2023-08-08 DIAGNOSIS — M5459 Other low back pain: Secondary | ICD-10-CM

## 2023-08-08 DIAGNOSIS — M6281 Muscle weakness (generalized): Secondary | ICD-10-CM

## 2023-08-08 DIAGNOSIS — M5440 Lumbago with sciatica, unspecified side: Secondary | ICD-10-CM | POA: Diagnosis not present

## 2023-08-08 NOTE — Therapy (Signed)
OUTPATIENT PHYSICAL THERAPY THORACOLUMBAR PROGRESS NOTE   Patient Name: Kelli Estrada MRN: [id number] DOB:[birth year], 62 y.o., female Today's Date: 08/08/2023  END OF SESSION:  PT End of Session - 08/08/23 1614     Visit Number 3    Number of Visits 12    Date for PT Re-Evaluation 09/12/23    Authorization Type [employer] DUAL COMPLETE    PT Start Time 1615    PT Stop Time 1650   low level   PT Time Calculation (min) 35 min    Activity Tolerance Patient tolerated treatment well             Past Medical History:  Diagnosis Date   Anxiety    Chronic pain    Depression    Family history of breast cancer 08/23/2022   H/O degenerative disc disease    History of radiation therapy    Left breast- 10/25/22-12/11/22- Dr. Antony Blackbird   History of radiation therapy    Left breast-10/25/22-12/11/22- Dr. Antony Blackbird   Pre-diabetes    PTSD (post-traumatic stress disorder)    Sleep apnea    Past Surgical History:  Procedure Laterality Date   ABDOMINAL HYSTERECTOMY     BREAST LUMPECTOMY WITH SENTINEL LYMPH NODE BIOPSY Left 09/14/2022   Procedure: LEFT BREAST CENTRAL LUMPECTOMY WITH SENTINEL LYMPH NODE BX;  Surgeon: Griselda Miner, MD;  Location: [facility];  Service: General;  Laterality: Left;   COLONOSCOPY WITH PROPOFOL N/A 03/15/2016   Procedure: COLONOSCOPY WITH PROPOFOL;  Surgeon: Dorena Cookey, MD;  Location: [facility];  Service: Endoscopy;  Laterality: N/A;   NECK SURGERY     Patient Active Problem List   Diagnosis Date Noted   Genetic testing 08/27/2022   Family history of breast cancer 08/23/2022   Malignant neoplasm of central portion of left breast in female, estrogen receptor positive (HCC) 08/20/2022   Chronic pain of left knee 06/03/2015   Depression, major, recurrent, moderate (HCC) 06/03/2015   Morbid obesity with body mass index of 50 or higher (HCC) 06/03/2015   Vitamin D deficiency 06/03/2015   PTSD (post-traumatic stress disorder)  06/03/2015   Obstructive sleep apnea 06/03/2015    REFERRING PROVIDER: Courtney Paris, NP   REFERRING DIAG: Lumbago with sciatica, unspecified side [M54.40]   Rationale for Evaluation and Treatment: Rehabilitation  THERAPY DIAG:  Other low back pain  Muscle weakness (generalized)  Difficulty in walking, not elsewhere classified  ONSET DATE: Several years.   SUBJECTIVE:  SUBJECTIVE STATEMENT: I loved the pool.  Yesterday I walked all the way across the yard to give my dtr something and she couldn't believe it that I walked that far!  I didn't even use my cane.  My electrolytes are low per test results.  Will discuss test results with the doctor next week.   PERTINENT HISTORY:  Anxiety, Depression, DDD, Pre diabetes, PTSD, Sleep apnea, hx of breast cancer.   PAIN:  Are you having pain? Yes: NPRS scale: 7/10 Pain location: Throughout neck, back and knees.  Pain description: Sharp pain Aggravating factors: Walking, doing activities around the house. Sitting for prolonged periods of time.  Relieving factors: Nothing at this time.   PRECAUTIONS: None  RED FLAGS: None   WEIGHT BEARING RESTRICTIONS: No  FALLS:  Has patient fallen in last 6 months? 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  10/24:  I want to be able to travel to [place] with my friend  OBJECTIVE:  Note: Objective measures were completed at Evaluation unless otherwise noted.  DIAGNOSTIC FINDINGS:  None in chart. [facility] per  pt.   PATIENT SURVEYS:  ODI: 29/50 58%   SCREENING FOR RED FLAGS: Bowel or bladder incontinence: Yes: incontinence   COGNITION: Overall cognitive status: Within functional limits for tasks assessed     SENSATION: WFL  POSTURE: rounded shoulders and decreased lumbar lordosis  PALPATION: No tenderness to   LUMBAR ROM:   Overall ROM is WFL with pt reporting pain with movement. Hip hinge with lumbar flexion.   LOWER EXTREMITY ROM:     Overall ROM is WFL with pt reporting pain with movement.   LOWER EXTREMITY MMT:    MMT Right eval Left eval  Hip flexion 4 4+  Knee flexion 4+ 4+  Knee extension 4+ 4+   (Blank rows = not tested)  FUNCTIONAL TESTS:  5 times sit to stand: 19.84 sec Timed up and go (TUG): 19.43 sec with SPC.   GAIT: Distance walked: 36ft Assistive device utilized: Single point cane Level of assistance: Complete Independence Comments: Pt walks with decreased stride and use of her AD.   TODAY'S TREATMENT:                                                                                                                              DATE:  10/24: Status update and response to initial HEP Supine abdominal draw in 10x Supine lumbar rotation 10x Supine ball squeeze 10x Supine green band clams 10x Supine with feet on knee wedge marching 15x Seated HS stretch dynamically 10x right/left ("feels better in the water") Seated 2# blue plyo ball: hip to hip, Vs, shoulder to hip  each side, ear to ear 5x each "This is a workout!"   RPE6-7/10  "Back (pain) is a little more than it was." Seated white foam roll press down 20x      08/02/23: Pt arrives for aquatic physical therapy. Treatment took place in 3.5-5.5 feet of water. Water temperature was 90 degrees F. Pt entered the pool via stairs, step to step  with heavy use of the rails. Pt requires buoyancy of water for support and to offload joints with strengthening exercises.  Seated water bench with 75%  submersion Pt performed seated LE AROM exercises 20x in all planes, concurrent education on water principles (review) and pain assessment. 75% water walking 4x in each direction holding the single buoy hand wts. Bil hip kicks 10x in each direction, pt requires holding onto pool wall for balance. Standing heel lifts 2x10, Seated hamstring stretch 2x 20 sec Bil. Small squat standing against the wall as pt performed shoulder flex/ext with TA activation.   Creating, reviewing, and completing below HEP    PATIENT EDUCATION:  Education details: Educated pt on anatomy and physiology of current symptoms, FOTO, diagnosis, prognosis, HEP,  and POC. Person educated: Patient Education method: Explanation Education comprehension: verbalized understanding  HOME EXERCISE PROGRAM: Access Code: [email or web address] URL: [email or web address] Date: 08/01/2023 Prepared by: Royal Hawthorn  Exercises - Supine Lower Trunk Rotation  - 2 x daily - 7 x weekly - 2 sets - 10 reps - Seated Hamstring Stretch  - 2 x daily - 7 x weekly - 2 sets - 2 reps - 30 hold - Supine Pelvic Tilt  - 1 x daily - 7 x weekly - 3 sets - 10 reps - Supine Transversus Abdominis Bracing - Hands on Stomach  - 1 x daily - 7 x weekly - 2 sets - 10 reps - 10 hold  ASSESSMENT:  CLINICAL IMPRESSION:  Intentional underdosing of exercise secondary to high pain irritability.  She performs ex's without visible signs of increased pain and converses throughout session but she does report an increase in pain at the end of session. Therapist monitoring response and modifying treatment accordingly. She is looking forward to the next aquatic PT session as she was able to exercise in the water with minimal pain.    OBJECTIVE IMPAIRMENTS: decreased activity tolerance, difficulty walking, decreased balance, decreased endurance, decreased mobility, decreased ROM, decreased strength, impaired flexibility, impaired UE/LE use, postural dysfunction, and  pain.  ACTIVITY LIMITATIONS: bending, lifting, carry, locomotion, cleaning, community activity, driving, and or occupation  PERSONAL FACTORS: Anxiety, Depression, DDD, Pre diabetes, PTSD, Sleep apnea, hx of breast cancer.  are also affecting patient's functional outcome.  REHAB POTENTIAL: Good  CLINICAL DECISION MAKING: Evolving/moderate complexity  EVALUATION COMPLEXITY: Moderate    GOALS: Short term PT Goals Target date: 08/15/2023 Pt will be I and compliant with HEP. Baseline:  Goal status: New Pt will decrease pain by 25% overall Baseline: Goal status: New  Long term PT goals Target date: 09/12/2023 Pt will improve ROM to [facility] with 4/10 pain to improve functional mobility Baseline: Goal status: New Pt will improve  hip/knee strength to at least 5-/5 MMT to improve functional strength Baseline: Goal status: New Pt will improve ODI to at least 15 points functional to show improved function Baseline: Goal status: New Pt will reduce pain by overall 50% overall with usual activity Baseline: Goal status: New Pt will decrease TUG to below 14 seconds in order to demonstrate decreased fall risk Pt will decrease STS by  3.6 seconds  in order to demonstrate decreased fall risk.  Pt will be able to ambulate community distances at least 1000 ft with AD and gait pattern without complaints Baseline: Goal status: New  PLAN: PT FREQUENCY: 1-2 times per week   PT DURATION: 6 weeks  PLANNED INTERVENTIONS (unless contraindicated): aquatic PT, Canalith repositioning, cryotherapy, Electrical stimulation, Iontophoresis with 4 mg/ml dexamethasome, Moist heat, traction, Ultrasound, gait training, Therapeutic exercise, balance training, neuromuscular re-education, patient/family education, prosthetic training, manual techniques, passive ROM, dry needling, taping, vasopnuematic device, vestibular, spinal manipulations, joint manipulations  PLAN FOR NEXT SESSION:  low level ex secondary to  high symptom irritability;  strengthen proximal hip musculature, stretching. Core strengthening, gait and balance. Aquatic PT  Lavinia Sharps, PT 08/08/23 5:04 PM Phone: [phone] Fax: [phone]

## 2023-08-14 ENCOUNTER — Encounter: Payer: Self-pay | Admitting: Physical Therapy

## 2023-08-14 ENCOUNTER — Ambulatory Visit: Payer: 59 | Admitting: Physical Therapy

## 2023-08-14 DIAGNOSIS — M6281 Muscle weakness (generalized): Secondary | ICD-10-CM

## 2023-08-14 DIAGNOSIS — M5459 Other low back pain: Secondary | ICD-10-CM

## 2023-08-14 DIAGNOSIS — R262 Difficulty in walking, not elsewhere classified: Secondary | ICD-10-CM

## 2023-08-14 DIAGNOSIS — M5440 Lumbago with sciatica, unspecified side: Secondary | ICD-10-CM | POA: Diagnosis not present

## 2023-08-14 NOTE — Therapy (Signed)
OUTPATIENT PHYSICAL THERAPY THORACOLUMBAR TREATMENT NOTE   Patient Name: Kelli Estrada MRN: [id number] DOB:[birth year], 62 y.o., female Today's Date: 08/14/2023  END OF SESSION:  PT End of Session - 08/14/23 1610     Visit Number 4    Number of Visits 12    Date for PT Re-Evaluation 09/12/23    Authorization Type [employer] DUAL COMPLETE    PT Start Time 1531    PT Stop Time 1610    PT Time Calculation (min) 39 min    Activity Tolerance Patient tolerated treatment well    Behavior During Therapy [facility] for tasks assessed/performed              Past Medical History:  Diagnosis Date   Anxiety    Chronic pain    Depression    Family history of breast cancer 08/23/2022   H/O degenerative disc disease    History of radiation therapy    Left breast- 10/25/22-12/11/22- Dr. Antony Blackbird   History of radiation therapy    Left breast-10/25/22-12/11/22- Dr. Antony Blackbird   Pre-diabetes    PTSD (post-traumatic stress disorder)    Sleep apnea    Past Surgical History:  Procedure Laterality Date   ABDOMINAL HYSTERECTOMY     BREAST LUMPECTOMY WITH SENTINEL LYMPH NODE BIOPSY Left 09/14/2022   Procedure: LEFT BREAST CENTRAL LUMPECTOMY WITH SENTINEL LYMPH NODE BX;  Surgeon: Griselda Miner, MD;  Location: [facility];  Service: General;  Laterality: Left;   COLONOSCOPY WITH PROPOFOL N/A 03/15/2016   Procedure: COLONOSCOPY WITH PROPOFOL;  Surgeon: Dorena Cookey, MD;  Location: [facility];  Service: Endoscopy;  Laterality: N/A;   NECK SURGERY     Patient Active Problem List   Diagnosis Date Noted   Genetic testing 08/27/2022   Family history of breast cancer 08/23/2022   Malignant neoplasm of central portion of left breast in female, estrogen receptor positive (HCC) 08/20/2022   Chronic pain of left knee 06/03/2015   Depression, major, recurrent, moderate (HCC) 06/03/2015   Morbid obesity with body mass index of 50 or higher (HCC) 06/03/2015   Vitamin D deficiency  06/03/2015   PTSD (post-traumatic stress disorder) 06/03/2015   Obstructive sleep apnea 06/03/2015    REFERRING PROVIDER: Courtney Paris, NP   REFERRING DIAG: Lumbago with sciatica, unspecified side [M54.40]   Rationale for Evaluation and Treatment: Rehabilitation  THERAPY DIAG:  Other low back pain  Muscle weakness (generalized)  Difficulty in walking, not elsewhere classified  ONSET DATE: Several years.   SUBJECTIVE:  SUBJECTIVE STATEMENT: Patient reports her back is painful today. She currently is having 7/10 pain.  PERTINENT HISTORY:  Anxiety, Depression, DDD, Pre diabetes, PTSD, Sleep apnea, hx of breast cancer.   PAIN: 08/14/2023 Are you having pain? Yes: NPRS scale: 7/10 Pain location: Throughout neck, back and knees.  Pain description: Sharp pain Aggravating factors: Walking, doing activities around the house. Sitting for prolonged periods of time.  Relieving factors: Nothing at this time.   PRECAUTIONS: None  RED FLAGS: None   WEIGHT BEARING RESTRICTIONS: No  FALLS:  Has patient fallen in last 6 months? 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  10/24:  I want to be able to travel to [place] with my friend  OBJECTIVE:  Note: Objective measures were completed at Evaluation unless otherwise noted.  DIAGNOSTIC FINDINGS:  None in chart. [facility] per pt.   PATIENT SURVEYS:  ODI: 29/50 58%   SCREENING FOR RED FLAGS: Bowel or bladder incontinence: Yes: incontinence    COGNITION: Overall cognitive status: Within functional limits for tasks assessed     SENSATION: WFL  POSTURE: rounded shoulders and decreased lumbar lordosis  PALPATION: No tenderness to   LUMBAR ROM:   Overall ROM is WFL with pt reporting pain with movement. Hip hinge with lumbar flexion.   LOWER EXTREMITY ROM:     Overall ROM is WFL with pt reporting pain with movement.   LOWER EXTREMITY MMT:    MMT Right eval Left eval  Hip flexion 4 4+  Knee flexion 4+ 4+  Knee extension 4+ 4+   (Blank rows = not tested)  FUNCTIONAL TESTS:  5 times sit to stand: 19.84 sec Timed up and go (TUG): 19.43 sec with SPC.   GAIT: Distance walked: 6ft Assistive device utilized: Single point cane Level of assistance: Complete Independence Comments: Pt walks with decreased stride and use of her AD.   TODAY'S TREATMENT:                                                                                                                              DATE:  08/14/2023: NuStep Level 1 5 minutes- PT present to discuss status Supine abdominal draw in 2 x 10 Supine lumbar rotation 10 x each side Supine ball squeeze  2 x 10 Supine green band clams 2 x 10 Supine with feet on knee wedge marching 2 x 10 each leg Supine hamstring stretch with green strap 2 x 30 sec Seated 2# blue plyo ball: hip to hip, Vs, shoulder to hip each side, ear to ear x10 each Seated white foam roll press down x20 4 sec hold Sit to stand 2 x 5 on plinth with  airex pad   10/24: Status update and response to initial HEP Supine abdominal draw in 10x Supine lumbar rotation 10x Supine ball squeeze 10x Supine green band clams 10x Supine with feet on knee wedge marching 15x Seated HS stretch dynamically 10x right/left ("feels better in the water") Seated 2# blue plyo ball: hip to hip, Vs, shoulder to hip each side, ear to ear 5x each "This is a workout!"   RPE6-7/10  "Back (pain) is a little more than it was." Seated  white foam roll press down 20x      08/02/23: Pt arrives for aquatic physical therapy. Treatment took place in 3.5-5.5 feet of water. Water temperature was 90 degrees F. Pt entered the pool via stairs, step to step  with heavy use of the rails. Pt requires buoyancy of water for support and to offload joints with strengthening exercises.  Seated water bench with 75% submersion Pt performed seated LE AROM exercises 20x in all planes, concurrent education on water principles (review) and pain assessment. 75% water walking 4x in each direction holding the single buoy hand wts. Bil hip kicks 10x in each direction, pt requires holding onto pool wall for balance. Standing heel lifts 2x10, Seated hamstring stretch 2x 20 sec Bil. Small squat standing against the wall as pt performed shoulder flex/ext with TA activation.   Creating, reviewing, and completing below HEP    PATIENT EDUCATION:  Education details: Educated pt on anatomy and physiology of current symptoms, FOTO, diagnosis, prognosis, HEP,  and POC. Person educated: Patient Education method: Explanation Education comprehension: verbalized understanding  HOME EXERCISE PROGRAM: Access Code: [email or web address] URL: [email or web address] Date: 08/01/2023 Prepared by: Royal Hawthorn  Exercises - Supine Lower Trunk Rotation  - 2 x daily - 7 x weekly - 2 sets - 10 reps - Seated Hamstring Stretch  - 2 x daily - 7 x weekly - 2 sets - 2 reps - 30 hold - Supine Pelvic Tilt  - 1 x daily - 7 x weekly - 3 sets - 10 reps - Supine Transversus Abdominis Bracing - Hands on Stomach  - 1 x daily - 7 x weekly - 2 sets - 10 reps - 10 hold  ASSESSMENT:  CLINICAL IMPRESSION:  Today's treatment session focused on lumbar mobility and core strengthening. Journee's back was very painful today, so treatment session was low intensity today. Patient tolerated treatment session well and she did not verbalize any increased pain. Patient tolerated progressions for core  exercises.   OBJECTIVE IMPAIRMENTS: decreased activity tolerance, difficulty walking, decreased balance, decreased endurance, decreased mobility, decreased ROM, decreased strength, impaired flexibility, impaired UE/LE use, postural dysfunction, and pain.  ACTIVITY LIMITATIONS: bending, lifting, carry, locomotion, cleaning, community activity, driving, and or occupation  PERSONAL FACTORS: Anxiety, Depression, DDD, Pre diabetes, PTSD, Sleep apnea, hx of breast cancer.  are also affecting patient's functional outcome.  REHAB POTENTIAL: Good  CLINICAL DECISION MAKING: Evolving/moderate complexity  EVALUATION COMPLEXITY: Moderate    GOALS: Short term PT Goals Target date: 08/15/2023 Pt will be I and compliant with HEP. Baseline:  Goal status: New Pt will decrease pain by 25% overall Baseline: Goal status: New  Long term PT goals Target date: 09/12/2023 Pt will improve ROM to [facility] with 4/10 pain to improve functional mobility Baseline: Goal status: New Pt will improve  hip/knee strength to at least 5-/5 MMT to improve functional strength Baseline: Goal status: New Pt will improve ODI to at least 15 points functional to show improved function Baseline: Goal status: New Pt  will reduce pain by overall 50% overall with usual activity Baseline: Goal status: New Pt will decrease TUG to below 14 seconds in order to demonstrate decreased fall risk Pt will decrease STS by  3.6 seconds in order to demonstrate decreased fall risk.  Pt will be able to ambulate community distances at least 1000 ft with AD and gait pattern without complaints Baseline: Goal status: New  PLAN: PT FREQUENCY: 1-2 times per week   PT DURATION: 6 weeks  PLANNED INTERVENTIONS (unless contraindicated): aquatic PT, Canalith repositioning, cryotherapy, Electrical stimulation, Iontophoresis with 4 mg/ml dexamethasome, Moist heat, traction, Ultrasound, gait training, Therapeutic exercise, balance training,  neuromuscular re-education, patient/family education, prosthetic training, manual techniques, passive ROM, dry needling, taping, vasopnuematic device, vestibular, spinal manipulations, joint manipulations  PLAN FOR NEXT SESSION:  3 MWT; continue low level ex secondary to high symptom irritability;  strengthen proximal hip musculature, stretching. Core strengthening, gait and balance. Aquatic PT  Claude Manges, PT 08/14/23 4:11 PM

## 2023-08-20 ENCOUNTER — Encounter: Payer: Self-pay | Admitting: Physical Therapy

## 2023-08-20 ENCOUNTER — Ambulatory Visit: Payer: 59 | Attending: Nurse Practitioner | Admitting: Physical Therapy

## 2023-08-20 DIAGNOSIS — M6281 Muscle weakness (generalized): Secondary | ICD-10-CM | POA: Diagnosis present

## 2023-08-20 DIAGNOSIS — Z483 Aftercare following surgery for neoplasm: Secondary | ICD-10-CM | POA: Diagnosis present

## 2023-08-20 DIAGNOSIS — R262 Difficulty in walking, not elsewhere classified: Secondary | ICD-10-CM | POA: Diagnosis present

## 2023-08-20 DIAGNOSIS — R293 Abnormal posture: Secondary | ICD-10-CM | POA: Diagnosis present

## 2023-08-20 DIAGNOSIS — M5459 Other low back pain: Secondary | ICD-10-CM | POA: Diagnosis present

## 2023-08-20 NOTE — Therapy (Signed)
OUTPATIENT PHYSICAL THERAPY THORACOLUMBAR TREATMENT NOTE   Patient Name: Kelli Estrada MRN: [id number] DOB:17-Nov-1960, 62 y.o., female Today's Date: 08/20/2023  END OF SESSION:  PT End of Session - 08/20/23 1556     Visit Number 5    Number of Visits 12    Date for PT Re-Evaluation 09/12/23    Authorization Type [employer] DUAL COMPLETE    PT Start Time 1450    PT Stop Time 1532    PT Time Calculation (min) 42 min    Activity Tolerance Patient tolerated treatment well    Behavior During Therapy [facility] for tasks assessed/performed               Past Medical History:  Diagnosis Date   Anxiety    Chronic pain    Depression    Family history of breast cancer 08/23/2022   H/O degenerative disc disease    History of radiation therapy    Left breast- 10/25/22-12/11/22- Dr. Antony Blackbird   History of radiation therapy    Left breast-10/25/22-12/11/22- Dr. Antony Blackbird   Pre-diabetes    PTSD (post-traumatic stress disorder)    Sleep apnea    Past Surgical History:  Procedure Laterality Date   ABDOMINAL HYSTERECTOMY     BREAST LUMPECTOMY WITH SENTINEL LYMPH NODE BIOPSY Left 09/14/2022   Procedure: LEFT BREAST CENTRAL LUMPECTOMY WITH SENTINEL LYMPH NODE BX;  Surgeon: Griselda Miner, MD;  Location: [facility];  Service: General;  Laterality: Left;   COLONOSCOPY WITH PROPOFOL N/A 03/15/2016   Procedure: COLONOSCOPY WITH PROPOFOL;  Surgeon: Dorena Cookey, MD;  Location: [facility];  Service: Endoscopy;  Laterality: N/A;   NECK SURGERY     Patient Active Problem List   Diagnosis Date Noted   Genetic testing 08/27/2022   Family history of breast cancer 08/23/2022   Malignant neoplasm of central portion of left breast in female, estrogen receptor positive (HCC) 08/20/2022   Chronic pain of left knee 06/03/2015   Depression, major, recurrent, moderate (HCC) 06/03/2015   Morbid obesity with body mass index of 50 or higher (HCC) 06/03/2015   Vitamin D deficiency  06/03/2015   PTSD (post-traumatic stress disorder) 06/03/2015   Obstructive sleep apnea 06/03/2015    REFERRING PROVIDER: Courtney Paris, NP   REFERRING DIAG: Lumbago with sciatica, unspecified side [M54.40]   Rationale for Evaluation and Treatment: Rehabilitation  THERAPY DIAG:  Other low back pain  Muscle weakness (generalized)  Difficulty in walking, not elsewhere classified  ONSET DATE: Several years.   SUBJECTIVE:  SUBJECTIVE STATEMENT: Patient reports she is very painful today. She is currently having 9/10 pain. After last treatment session she took a nap.  PERTINENT HISTORY:  Anxiety, Depression, DDD, Pre diabetes, PTSD, Sleep apnea, hx of breast cancer.   PAIN: 08/14/2023 Are you having pain? Yes: NPRS scale: 7/10 Pain location: Throughout neck, back and knees.  Pain description: Sharp pain Aggravating factors: Walking, doing activities around the house. Sitting for prolonged periods of time.  Relieving factors: Nothing at this time.   PRECAUTIONS: None  RED FLAGS: None   WEIGHT BEARING RESTRICTIONS: No  FALLS:  Has patient fallen in last 6 months? 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  10/24:  I want to be able to travel to [place] with my friend  OBJECTIVE:  Note: Objective measures were completed at Evaluation unless otherwise noted.  DIAGNOSTIC FINDINGS:  None in chart. [facility] per pt.   PATIENT SURVEYS:  ODI: 29/50 58%   SCREENING FOR RED FLAGS: Bowel or  bladder incontinence: Yes: incontinence   COGNITION: Overall cognitive status: Within functional limits for tasks assessed     SENSATION: WFL  POSTURE: rounded shoulders and decreased lumbar lordosis  PALPATION: No tenderness to   LUMBAR ROM:   Overall ROM is WFL with pt reporting pain with movement. Hip hinge with lumbar flexion.   LOWER EXTREMITY ROM:     Overall ROM is WFL with pt reporting pain with movement.   LOWER EXTREMITY MMT:    MMT Right eval Left eval  Hip flexion 4 4+  Knee flexion 4+ 4+  Knee extension 4+ 4+   (Blank rows = not tested)  FUNCTIONAL TESTS:  5 times sit to stand: 19.84 sec Timed up and go (TUG): 19.43 sec with SPC.   GAIT: Distance walked: 68ft Assistive device utilized: Single point cane Level of assistance: Complete Independence Comments: Pt walks with decreased stride and use of her AD.   TODAY'S TREATMENT:                                                                                                                              DATE:  08/20/2023: NuStep Level 1 6 minutes- PT present to discuss status Supine abdominal draw in 2 x 10 Supine lumbar rotation 10 x each side Supine ball squeeze  2 x 10 Supine blue band clams 2 x 10 TA contraction + bent knee fall out x 10 Supine with feet on knee wedge marching 2 x 10 each leg Supine hamstring stretch with green strap 2 x 30 sec Manual: Addaday to bilateral lumbar spine to increase circulation. PT present to monitor status.   08/14/2023: NuStep Level 1 5  minutes- PT present to discuss status Supine abdominal draw in 2 x 10 Supine lumbar rotation 10 x each side Supine ball squeeze  2 x 10 Supine green band clams 2 x 10 Supine with feet on knee wedge marching 2 x 10 each leg Supine hamstring stretch with green strap 2 x 30 sec Seated 2# blue plyo ball: hip to hip, Vs, shoulder to hip each side, ear to ear x10 each Seated white foam roll press down x20 4 sec hold Sit to stand 2 x  5 on plinth with airex pad   10/24: Status update and response to initial HEP Supine abdominal draw in 10x Supine lumbar rotation 10x Supine ball squeeze 10x Supine green band clams 10x Supine with feet on knee wedge marching 15x Seated HS stretch dynamically 10x right/left ("feels better in the water") Seated 2# blue plyo ball: hip to hip, Vs, shoulder to hip each side, ear to ear 5x each "This is a workout!"   RPE6-7/10  "Back (pain) is a little more than it was." Seated white foam roll press down 20x      08/02/23: Pt arrives for aquatic physical therapy. Treatment took place in 3.5-5.5 feet of water. Water temperature was 90 degrees F. Pt entered the pool via stairs, step to step  with heavy use of the rails. Pt requires buoyancy of water for support and to offload joints with strengthening exercises.  Seated water bench with 75% submersion Pt performed seated LE AROM exercises 20x in all planes, concurrent education on water principles (review) and pain assessment. 75% water walking 4x in each direction holding the single buoy hand wts. Bil hip kicks 10x in each direction, pt requires holding onto pool wall for balance. Standing heel lifts 2x10, Seated hamstring stretch 2x 20 sec Bil. Small squat standing against the wall as pt performed shoulder flex/ext with TA activation.   Creating, reviewing, and completing below HEP    PATIENT EDUCATION:  Education details: Educated pt on anatomy and physiology of current symptoms, FOTO, diagnosis, prognosis, HEP,  and POC. Person educated: Patient Education method: Explanation Education comprehension: verbalized understanding  HOME EXERCISE PROGRAM: Access Code: [email or web address] URL: [email or web address] Date: 08/01/2023 Prepared by: Royal Hawthorn  Exercises - Supine Lower Trunk Rotation  - 2 x daily - 7 x weekly - 2 sets - 10 reps - Seated Hamstring Stretch  - 2 x daily - 7 x weekly - 2 sets - 2 reps - 30 hold - Supine Pelvic  Tilt  - 1 x daily - 7 x weekly - 3 sets - 10 reps - Supine Transversus Abdominis Bracing - Hands on Stomach  - 1 x daily - 7 x weekly - 2 sets - 10 reps - 10 hold  ASSESSMENT:  CLINICAL IMPRESSION:  Today's treatment session focused on lumbar mobility and core strengthening. Kelli Estrada continues to have increased pain levels that are limiting her functional activities. Continued with lower level exercises today to not exacerbate patient's pain. Patient tolerated treatment session well and only experienced moderate increases in back pain. She responded favorably to manual Addaday to lumbar spine and verbalized a slight decrease in pain. Patient will benefit from skilled PT to address the below impairments and improve overall function.   OBJECTIVE IMPAIRMENTS: decreased activity tolerance, difficulty walking, decreased balance, decreased endurance, decreased mobility, decreased ROM, decreased strength, impaired flexibility, impaired UE/LE use, postural dysfunction, and pain.  ACTIVITY LIMITATIONS: bending, lifting, carry, locomotion, cleaning, community activity, driving, and or occupation  PERSONAL FACTORS: Anxiety,  Depression, DDD, Pre diabetes, PTSD, Sleep apnea, hx of breast cancer.  are also affecting patient's functional outcome.  REHAB POTENTIAL: Good  CLINICAL DECISION MAKING: Evolving/moderate complexity  EVALUATION COMPLEXITY: Moderate    GOALS: Short term PT Goals Target date: 08/15/2023 Pt will be I and compliant with HEP. Baseline:  Goal status: New Pt will decrease pain by 25% overall Baseline: Goal status: New  Long term PT goals Target date: 09/12/2023 Pt will improve ROM to [facility] with 4/10 pain to improve functional mobility Baseline: Goal status: New Pt will improve  hip/knee strength to at least 5-/5 MMT to improve functional strength Baseline: Goal status: New Pt will improve ODI to at least 15 points functional to show improved function Baseline: Goal status:  New Pt will reduce pain by overall 50% overall with usual activity Baseline: Goal status: New Pt will decrease TUG to below 14 seconds in order to demonstrate decreased fall risk Pt will decrease STS by  3.6 seconds in order to demonstrate decreased fall risk.  Pt will be able to ambulate community distances at least 1000 ft with AD and gait pattern without complaints Baseline: Goal status: New  PLAN: PT FREQUENCY: 1-2 times per week   PT DURATION: 6 weeks  PLANNED INTERVENTIONS (unless contraindicated): aquatic PT, Canalith repositioning, cryotherapy, Electrical stimulation, Iontophoresis with 4 mg/ml dexamethasome, Moist heat, traction, Ultrasound, gait training, Therapeutic exercise, balance training, neuromuscular re-education, patient/family education, prosthetic training, manual techniques, passive ROM, dry needling, taping, vasopnuematic device, vestibular, spinal manipulations, joint manipulations  PLAN FOR NEXT SESSION:  assess pain levels; progress core exercises as tolerated; manual as indicated   Claude Manges, PT 08/20/23 3:57 PM

## 2023-08-21 ENCOUNTER — Other Ambulatory Visit: Payer: Self-pay

## 2023-08-21 ENCOUNTER — Inpatient Hospital Stay (HOSPITAL_BASED_OUTPATIENT_CLINIC_OR_DEPARTMENT_OTHER): Payer: 59 | Admitting: Hematology and Oncology

## 2023-08-21 ENCOUNTER — Inpatient Hospital Stay: Payer: 59 | Attending: Hematology and Oncology

## 2023-08-21 VITALS — BP 143/91 | HR 71 | Temp 97.9°F | Resp 18 | Wt 255.0 lb

## 2023-08-21 DIAGNOSIS — Z79811 Long term (current) use of aromatase inhibitors: Secondary | ICD-10-CM | POA: Insufficient documentation

## 2023-08-21 DIAGNOSIS — Z170 Estrogen receptor positive status [ER+]: Secondary | ICD-10-CM | POA: Insufficient documentation

## 2023-08-21 DIAGNOSIS — C50112 Malignant neoplasm of central portion of left female breast: Secondary | ICD-10-CM | POA: Insufficient documentation

## 2023-08-21 DIAGNOSIS — H5711 Ocular pain, right eye: Secondary | ICD-10-CM | POA: Diagnosis not present

## 2023-08-21 DIAGNOSIS — R197 Diarrhea, unspecified: Secondary | ICD-10-CM | POA: Insufficient documentation

## 2023-08-21 DIAGNOSIS — Z1721 Progesterone receptor positive status: Secondary | ICD-10-CM | POA: Insufficient documentation

## 2023-08-21 DIAGNOSIS — R519 Headache, unspecified: Secondary | ICD-10-CM | POA: Insufficient documentation

## 2023-08-21 LAB — CMP (CANCER CENTER ONLY)
ALT: 13 U/L (ref 0–44)
AST: 14 U/L — ABNORMAL LOW (ref 15–41)
Albumin: 3.8 g/dL (ref 3.5–5.0)
Alkaline Phosphatase: 61 U/L (ref 38–126)
Anion gap: 5 (ref 5–15)
BUN: 14 mg/dL (ref 8–23)
CO2: 30 mmol/L (ref 22–32)
Calcium: 9.1 mg/dL (ref 8.9–10.3)
Chloride: 106 mmol/L (ref 98–111)
Creatinine: 0.69 mg/dL (ref 0.44–1.00)
GFR, Estimated: >60 mL/min (ref >60)
Glucose, Bld: 93 mg/dL (ref 70–99)
Potassium: 3.9 mmol/L (ref 3.5–5.1)
Sodium: 141 mmol/L (ref 135–145)
Total Bilirubin: 0.5 mg/dL (ref <1.2)
Total Protein: 6.5 g/dL (ref 6.5–8.1)

## 2023-08-21 LAB — CBC WITH DIFFERENTIAL (CANCER CENTER ONLY)
Abs Immature Granulocytes: 0.01 10*3/uL (ref 0.00–0.07)
Basophils Absolute: 0 10*3/uL (ref 0.0–0.1)
Basophils Relative: 1 %
Eosinophils Absolute: 0.1 10*3/uL (ref 0.0–0.5)
Eosinophils Relative: 2 %
HCT: 38.1 % (ref 36.0–46.0)
Hemoglobin: 12.5 g/dL (ref 12.0–15.0)
Immature Granulocytes: 0 %
Lymphocytes Relative: 22 %
Lymphs Abs: 0.8 10*3/uL (ref 0.7–4.0)
MCH: 29.4 pg (ref 26.0–34.0)
MCHC: 32.8 g/dL (ref 30.0–36.0)
MCV: 89.6 fL (ref 80.0–100.0)
Monocytes Absolute: 0.3 10*3/uL (ref 0.1–1.0)
Monocytes Relative: 7 %
Neutro Abs: 2.5 10*3/uL (ref 1.7–7.7)
Neutrophils Relative %: 68 %
Platelet Count: 135 10*3/uL — ABNORMAL LOW (ref 150–400)
RBC: 4.25 MIL/uL (ref 3.87–5.11)
RDW: 13.8 % (ref 11.5–15.5)
WBC Count: 3.6 10*3/uL — ABNORMAL LOW (ref 4.0–10.5)
nRBC: 0 % (ref 0.0–0.2)

## 2023-08-21 NOTE — Assessment & Plan Note (Addendum)
  Breast Cancer On Verzenio 100mg  every 12 hours and Anastrozole daily Follow-up mammogram on 08/02/2023 showed no evidence of malignancy.  Reports occasional nausea but tolerating medications well. -Continue current medications. -Next mammogram due in 12 months.  Gastrointestinal Issues Reports variable diarrhea since colonoscopy. No other significant GI symptoms. -Continue current management.  Eye Pain Reports right eye pain contributing to headaches. No recent bleeding. -Schedule follow-up appointment with ophthalmologist.  General Health Maintenance -Continue working on diet and weight loss. -Follow-up appointment with Kelli Estrada next visit.

## 2023-08-21 NOTE — Progress Notes (Signed)
Patient Care Team: Frederich Chick., MD as PCP - General (Family Medicine) Rachel Moulds, MD as Consulting Physician (Hematology and Oncology) Griselda Miner, MD as Consulting Physician (General Surgery) Antony Blackbird, MD as Consulting Physician (Radiation Oncology) Anselm Lis, RPH-CPP as Pharmacist (Hematology and Oncology)  DIAGNOSIS:  Encounter Diagnosis  Name Primary?   Malignant neoplasm of central portion of left breast in female, estrogen receptor positive (HCC) Yes     SUMMARY OF ONCOLOGIC HISTORY: Oncology History  Malignant neoplasm of central portion of left breast in female, estrogen receptor positive (HCC)  07/18/2022 Imaging   Mammogram showed indeterminate left breast mass central to the nipple in the retroareolar region, indeterminate lymph node.  Ultrasound done showed 1.9 x 1.4 x 1.5 cm taller than wide irregular mass in the left breast highly suggestive of malignancy.  No significant abnormality seen in the left axilla   08/16/2022 Pathology Results   Pathology from the left breast mass showed grade 2 invasive ductal carcinoma ER 100% positive strong staining PR 90% positive strong staining Ki-67 of 10% and HER2 1+ by [facility]   08/22/2022 Cancer Staging   Staging form: Breast, AJCC 8th Edition - Pathologic: Stage IB (pT2, pN1, cM0, G2, ER+, PR+, HER2-, Oncotype DX score: 15) - Signed by Rachel Moulds, MD on 03/05/2023 Multigene prognostic tests performed: Oncotype DX Recurrence score range: Greater than or equal to 11 Histologic grading system: 3 grade system   08/27/2022 Genetic Testing   Negative CustomNext-Cancer +RNAinsight Panel.  Report date is 08/29/2022.   The CustomNext-Cancer+RNAinsight panel offered by [facility] includes sequencing and rearrangement analysis for the following 47 genes:  APC, ATM, AXIN2, BARD1, BMPR1A, BRCA1, BRCA2, BRIP1, CDH1, CDK4, CDKN2A, CHEK2, DICER1, EPCAM, GREM1, HOXB13, MEN1, MLH1, MSH2, MSH3, MSH6, MUTYH, NBN,  NF1, NF2, NTHL1, PALB2, PMS2, POLD1, POLE, PTEN, RAD51C, RAD51D, RECQL, RET, SDHA, SDHAF2, SDHB, SDHC, SDHD, SMAD4, SMARCA4, STK11, TP53, TSC1, TSC2, and VHL.  RNA data is routinely analyzed for use in variant interpretation for all genes.   09/14/2022 Surgery   Left lumpectomy, IDC, 2.1cm, g2, margins negative, 1/3 LN positive for metastases, T2n1a   10/04/2022 Oncotype testing   Oncotype DX of 15, no role for adj chemo.   10/25/2022 - 12/11/2022 Radiation Therapy   Plan Name: Breast_L_BH Site: Breast, Left Technique: 3D Mode: Photon Dose Per Fraction: 1.8 Gy Prescribed Dose (Delivered / Prescribed): 50.4 Gy / 50.4 Gy Prescribed Fxs (Delivered / Prescribed): 28 / 28   Plan Name: Brst_L_SCV_BH Site: Breast, Left Technique: 3D Mode: Photon Dose Per Fraction: 1.8 Gy Prescribed Dose (Delivered / Prescribed): 50.4 Gy / 50.4 Gy Prescribed Fxs (Delivered / Prescribed): 28 / 28   Plan Name: Brst_L_Bst_BH Site: Breast, Left Technique: 3D Mode: Photon Dose Per Fraction: 2 Gy Prescribed Dose (Delivered / Prescribed): 10 Gy / 10 Gy Prescribed Fxs (Delivered / Prescribed): 5 / 5     12/2022 -  Anti-estrogen oral therapy   Anastrozole daily, Verzenio added in 02/2023     Discussed the use of AI scribe software for clinical note transcription with the patient, who gave verbal consent to proceed.  History of Present Illness    Mr. Alaynna is here for follow-up on Verzenio and anastrozole.   Since her last visit here Ms. Hartzell has been tolerating the combination extremely well.  She reports some intermittent diarrhea but not on a daily basis.  She denies any nausea or vomiting.  No new change in breathing, bone pains, change in bowel  habits or urinary habits. She had a mammogram recently at [facility], this was unremarkable without any evidence of breast cancer.  ALLERGIES:  has No Known Allergies.  MEDICATIONS:  Current Outpatient Medications  Medication Sig Dispense Refill   abemaciclib  (VERZENIO) 100 MG tablet Take 1 tablet (100 mg total) by mouth 2 (two) times daily. 56 tablet 1   albuterol (VENTOLIN HFA) 108 (90 Base) MCG/ACT inhaler SMARTSIG:1 Puff(s) Via Inhaler 4 Times Daily PRN     alendronate (FOSAMAX) 70 MG tablet Take 70 mg by mouth once a week.     anastrozole (ARIMIDEX) 1 MG tablet Take 1 tablet (1 mg total) by mouth daily. 90 tablet 3   Blood Glucose Monitoring Suppl (ONETOUCH VERIO REFLECT) w/Device KIT See admin instructions.     Cholecalciferol (VITAMIN D3) 250 MCG (10000 UT) capsule Take 10,000 Units by mouth daily.     [employer] VITAMIN E 180 MG (400 UNIT) CAPS Take 2 capsules by mouth daily.     fenofibrate (TRICOR) 48 MG tablet Take 40 mg by mouth daily.     Fluticasone-Umeclidin-Vilant (TRELEGY ELLIPTA) 100-62.5-25 MCG/ACT AEPB Inhale 100 mcg into the lungs 1 day or 1 dose.     gabapentin (NEURONTIN) 300 MG capsule Take 300 mg by mouth 3 (three) times daily.     Ginger 500 MG CAPS Take by mouth.     hydrOXYzine (VISTARIL) 25 MG capsule Take 25 mg by mouth 2 (two) times daily as needed for anxiety.   0   loperamide (IMODIUM) 2 MG capsule Take 2 mg by mouth as needed for diarrhea or loose stools.     mirabegron ER (MYRBETRIQ) 25 MG TB24 tablet Take 1 tablet (25 mg total) by mouth daily. 30 tablet 11   Misc Natural Products (OSTEO BI-FLEX JOINT SHIELD PO) Take by mouth daily.     omeprazole (PRILOSEC OTC) 20 MG tablet Take 20 mg by mouth daily.     ondansetron (ZOFRAN) 8 MG tablet Take 1 tablet (8 mg total) by mouth every 8 (eight) hours as needed for nausea or vomiting. 30 tablet 2   Oxcarbazepine (TRILEPTAL) 300 MG tablet Take 300 mg by mouth 2 (two) times daily.     oxyCODONE-acetaminophen (PERCOCET/ROXICET) 5-325 MG tablet Take 1 tablet by mouth every 6 (six) hours as needed (For pain.).      OZEMPIC, 1 MG/DOSE, 4 MG/3ML SOPN Inject 1 mg into the skin once a week.     prochlorperazine (COMPAZINE) 10 MG tablet Take 1 tablet (10 mg total) by mouth every 6 (six)  hours as needed for nausea or vomiting. 30 tablet 2   rosuvastatin (CRESTOR) 20 MG tablet Take 20 mg by mouth at bedtime.     sertraline (ZOLOFT) 100 MG tablet Take 100 mg by mouth 2 (two) times daily.  2   tamsulosin (FLOMAX) 0.4 MG CAPS capsule Take 1 capsule (0.4 mg total) by mouth daily. 90 capsule 3   topiramate (TOPAMAX) 100 MG tablet Take 100 mg by mouth daily.     No current facility-administered medications for this visit.    PHYSICAL EXAMINATION: ECOG PERFORMANCE STATUS: 0 - Asymptomatic  Vitals:   08/21/23 1338  BP: (!) 143/91  Pulse: 71  Resp: 18  Temp: 97.9 F (36.6 C)  SpO2: 94%   Filed Weights   08/21/23 1338  Weight: 255 lb (115.7 kg)   Physical Exam Constitutional:      Appearance: Normal appearance.  Cardiovascular:     Rate and Rhythm: Normal rate  and regular rhythm.     Pulses: Normal pulses.     Heart sounds: Normal heart sounds.  Pulmonary:     Effort: Pulmonary effort is normal.     Breath sounds: Normal breath sounds.  Musculoskeletal:     Cervical back: Normal range of motion and neck supple. No rigidity.  Lymphadenopathy:     Cervical: No cervical adenopathy.  Skin:    General: Skin is warm and dry.  Neurological:     Mental Status: She is alert.        LABORATORY DATA:  I have reviewed the data as listed    Latest Ref Rng & Units 08/21/2023   12:49 PM 07/24/2023    1:00 PM 06/27/2023    2:06 PM  CMP  Glucose 70 - 99 mg/dL 93  098  119   BUN 8 - 23 mg/dL 14  14  8    Creatinine 0.44 - 1.00 mg/dL 1.47  8.29  5.62   Sodium 135 - 145 mmol/L 141  140  142   Potassium 3.5 - 5.1 mmol/L 3.9  4.4  3.7   Chloride 98 - 111 mmol/L 106  106  108   CO2 22 - 32 mmol/L 30  30  27    Calcium 8.9 - 10.3 mg/dL 9.1  9.1  9.0   Total Protein 6.5 - 8.1 g/dL 6.5  6.3  6.5   Total Bilirubin <1.2 mg/dL 0.5  0.6  0.5   Alkaline Phos 38 - 126 U/L 61  61  50   AST 15 - 41 U/L 14  14  13    ALT 0 - 44 U/L 13  14  12      Lab Results  Component Value Date    WBC 3.6 (L) 08/21/2023   HGB 12.5 08/21/2023   HCT 38.1 08/21/2023   MCV 89.6 08/21/2023   PLT 135 (L) 08/21/2023   NEUTROABS 2.5 08/21/2023    ASSESSMENT & PLAN:  Malignant neoplasm of central portion of left breast in female, estrogen receptor positive (HCC)  Breast Cancer On Verzenio 100mg  every 12 hours and Anastrozole daily Follow-up mammogram on 08/02/2023 showed no evidence of malignancy.  Reports occasional nausea but tolerating medications well. -Continue current medications. -Next mammogram due in 12 months.  Gastrointestinal Issues Reports variable diarrhea since colonoscopy. No other significant GI symptoms. -Continue current management.  Eye Pain Reports right eye pain contributing to headaches. No recent bleeding. -Schedule follow-up appointment with ophthalmologist.  General Health Maintenance -Continue working on diet and weight loss. -Follow-up appointment with [name] next visit.   ------------------------------------------------------------------------------------------------------------------------------ Assessment and Plan               No orders of the defined types were placed in this encounter.   Rachel Moulds, MD 08/21/23

## 2023-08-26 ENCOUNTER — Encounter: Payer: Self-pay | Admitting: Physical Therapy

## 2023-08-26 ENCOUNTER — Ambulatory Visit: Payer: 59 | Admitting: Physical Therapy

## 2023-08-26 ENCOUNTER — Ambulatory Visit: Payer: 59 | Attending: Radiology | Admitting: Physical Therapy

## 2023-08-26 DIAGNOSIS — Z483 Aftercare following surgery for neoplasm: Secondary | ICD-10-CM | POA: Insufficient documentation

## 2023-08-26 DIAGNOSIS — M5459 Other low back pain: Secondary | ICD-10-CM | POA: Diagnosis not present

## 2023-08-26 DIAGNOSIS — M6281 Muscle weakness (generalized): Secondary | ICD-10-CM

## 2023-08-26 DIAGNOSIS — R262 Difficulty in walking, not elsewhere classified: Secondary | ICD-10-CM

## 2023-08-26 NOTE — Therapy (Signed)
OUTPATIENT PHYSICAL THERAPY THORACOLUMBAR TREATMENT NOTE   Patient Name: Kelli Estrada MRN: [id number] DOB:May 21, 1961, 62 y.o., female Today's Date: 08/26/2023  END OF SESSION:  PT End of Session - 08/26/23 1701     Visit Number 6    Date for PT Re-Evaluation 09/12/23    Authorization Type [employer] DUAL COMPLETE    Progress Note Due on Visit 10    PT Start Time 1402    PT Stop Time 1446    PT Time Calculation (min) 44 min    Activity Tolerance Patient tolerated treatment well    Behavior During Therapy [facility] for tasks assessed/performed                Past Medical History:  Diagnosis Date   Anxiety    Chronic pain    Depression    Family history of breast cancer 08/23/2022   H/O degenerative disc disease    History of radiation therapy    Left breast- 10/25/22-12/11/22- Dr. Antony Blackbird   History of radiation therapy    Left breast-10/25/22-12/11/22- Dr. Antony Blackbird   Pre-diabetes    PTSD (post-traumatic stress disorder)    Sleep apnea    Past Surgical History:  Procedure Laterality Date   ABDOMINAL HYSTERECTOMY     BREAST LUMPECTOMY WITH SENTINEL LYMPH NODE BIOPSY Left 09/14/2022   Procedure: LEFT BREAST CENTRAL LUMPECTOMY WITH SENTINEL LYMPH NODE BX;  Surgeon: Griselda Miner, MD;  Location: [facility];  Service: General;  Laterality: Left;   COLONOSCOPY WITH PROPOFOL N/A 03/15/2016   Procedure: COLONOSCOPY WITH PROPOFOL;  Surgeon: Dorena Cookey, MD;  Location: [facility];  Service: Endoscopy;  Laterality: N/A;   NECK SURGERY     Patient Active Problem List   Diagnosis Date Noted   Genetic testing 08/27/2022   Family history of breast cancer 08/23/2022   Malignant neoplasm of central portion of left breast in female, estrogen receptor positive (HCC) 08/20/2022   Chronic pain of left knee 06/03/2015   Depression, major, recurrent, moderate (HCC) 06/03/2015   Morbid obesity with body mass index of 50 or higher (HCC) 06/03/2015   Vitamin D  deficiency 06/03/2015   PTSD (post-traumatic stress disorder) 06/03/2015   Obstructive sleep apnea 06/03/2015    REFERRING PROVIDER: Courtney Paris, NP   REFERRING DIAG: Lumbago with sciatica, unspecified side [M54.40]   Rationale for Evaluation and Treatment: Rehabilitation  THERAPY DIAG:  Other low back pain  Muscle weakness (generalized)  Difficulty in walking, not elsewhere classified  ONSET DATE: Several years.   SUBJECTIVE:  SUBJECTIVE STATEMENT:Patient reports she is doing okay today. Her back and knee are bothering her more than her low back currently.  PERTINENT HISTORY:  Anxiety, Depression, DDD, Pre diabetes, PTSD, Sleep apnea, hx of breast cancer.   PAIN: 08/14/2023 Are you having pain? Yes: NPRS scale: 7/10 Pain location: Throughout neck, back and knees.  Pain description: Sharp pain Aggravating factors: Walking, doing activities around the house. Sitting for prolonged periods of time.  Relieving factors: Nothing at this time.   PRECAUTIONS: None  RED FLAGS: None   WEIGHT BEARING RESTRICTIONS: No  FALLS:  Has patient fallen in last 6 months? 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  10/24:  I want to be able to travel to [place] with my friend  OBJECTIVE:  Note: Objective measures were completed at Evaluation unless otherwise noted.  DIAGNOSTIC FINDINGS:  None in chart. [facility] per pt.   PATIENT SURVEYS:  ODI: 29/50 58%   SCREENING FOR RED FLAGS: Bowel or  bladder incontinence: Yes: incontinence   COGNITION: Overall cognitive status: Within functional limits for tasks assessed     SENSATION: WFL  POSTURE: rounded shoulders and decreased lumbar lordosis  PALPATION: No tenderness to   LUMBAR ROM:   Overall ROM is WFL with pt reporting pain with movement. Hip hinge with lumbar flexion.   LOWER EXTREMITY ROM:     Overall ROM is WFL with pt reporting pain with movement.   LOWER EXTREMITY MMT:    MMT Right eval Left eval  Hip flexion 4 4+  Knee flexion 4+ 4+  Knee extension 4+ 4+   (Blank rows = not tested)  FUNCTIONAL TESTS:  5 times sit to stand: 19.84 sec Timed up and go (TUG): 19.43 sec with SPC.   08/26/2023 : 286FT GAIT: Distance walked: 65ft Assistive device utilized: Single point cane Level of assistance: Complete Independence Comments: Pt walks with decreased stride and use of her AD.   TODAY'S TREATMENT:                                                                                                                              DATE:  08/26/2023: Seated cervical SNAGs (rotation) x 10 each direction - Lt side more painful Seated Hip flexion & LAQ 2# AW 2 x 10 each Seated ball squeezes x 20 Seated hip abduction with yellow loop x20 Supine lumbar rotation 10 x each side TA contraction + bent knee fall out x 10 Supine TA contraction + hip flexion with yellow loop x 20 each : 286FT ; increased back pain at 2 mins Seated knee flexion green TB 2 x 10 each   08/20/2023: NuStep Level 1 6  minutes- PT present to discuss status Supine abdominal draw in 2 x 10 Supine lumbar rotation 10 x each side Supine ball squeeze  2 x 10 Supine blue band clams 2 x 10 TA contraction + bent knee fall out x 10 Supine with feet on knee wedge marching 2 x 10 each leg Supine hamstring stretch with green strap 2 x 30 sec Manual: Addaday to bilateral lumbar spine to increase circulation. PT present to monitor  status.   08/14/2023: NuStep Level 1 5 minutes- PT present to discuss status Supine abdominal draw in 2 x 10 Supine lumbar rotation 10 x each side Supine ball squeeze  2 x 10 Supine green band clams 2 x 10 Supine with feet on knee wedge marching 2 x 10 each leg Supine hamstring stretch with green strap 2 x 30 sec Seated 2# blue plyo ball: hip to hip, Vs, shoulder to hip each side, ear to ear x10 each Seated white foam roll press down x20 4 sec hold Sit to stand 2 x 5 on plinth with airex pad   10/24: Status update and response to initial HEP Supine abdominal draw in 10x Supine lumbar rotation 10x Supine ball squeeze 10x Supine green band clams 10x Supine with feet on knee wedge marching 15x Seated HS stretch dynamically 10x right/left ("feels better in the water") Seated 2# blue plyo ball: hip to hip, Vs, shoulder to hip each side, ear to ear 5x each "This is a workout!"   RPE6-7/10  "Back (pain) is a little more than it was." Seated white foam roll press down 20x      08/02/23: Pt arrives for aquatic physical therapy. Treatment took place in 3.5-5.5 feet of water. Water temperature was 90 degrees F. Pt entered the pool via stairs, step to step  with heavy use of the rails. Pt requires buoyancy of water for support and to offload joints with strengthening exercises.  Seated water bench with 75% submersion Pt performed seated LE AROM exercises 20x in all planes, concurrent education on water principles (review) and pain assessment. 75% water walking 4x in each direction holding the single buoy hand wts. Bil hip kicks 10x in each direction, pt requires holding onto pool wall for balance. Standing heel lifts 2x10, Seated hamstring stretch 2x 20 sec Bil. Small squat standing against the wall as pt performed shoulder flex/ext with TA activation.   Creating, reviewing, and completing below HEP    PATIENT EDUCATION:  Education details: Educated pt on anatomy and physiology of current  symptoms, FOTO, diagnosis, prognosis, HEP,  and POC. Person educated: Patient Education method: Explanation Education comprehension: verbalized understanding  HOME EXERCISE PROGRAM: Access Code: [email or web address] URL: [email or web address] Date: 08/01/2023 Prepared by: Royal Hawthorn  Exercises - Supine Lower Trunk Rotation  - 2 x daily - 7 x weekly - 2 sets - 10 reps - Seated Hamstring Stretch  - 2 x daily - 7 x weekly - 2 sets - 2 reps - 30 hold - Supine Pelvic Tilt  - 1 x daily - 7 x weekly - 3 sets - 10 reps - Supine Transversus Abdominis Bracing - Hands on Stomach  - 1 x daily - 7 x weekly - 2 sets - 10 reps - 10 hold  ASSESSMENT:  CLINICAL IMPRESSION:  Today's treatment session focused on LE & core strengthening. Patient continues to experiences increased pain that is limiting her mobility. Incorporated seated hip strengthening exercises this session and patient tolerated them well. Completed test and patient walked  286 ft with increased back pain after 2 minutes. Noted slow cadence, decreased step length, and antalgic gait. Patient required verbal and visual cues for correct exercise performance. Patient will benefit from skilled PT to address the below impairments and improve overall function.    OBJECTIVE IMPAIRMENTS: decreased activity tolerance, difficulty walking, decreased balance, decreased endurance, decreased mobility, decreased ROM, decreased strength, impaired flexibility, impaired UE/LE use, postural dysfunction, and pain.  ACTIVITY LIMITATIONS: bending, lifting, carry, locomotion, cleaning, community activity, driving, and or occupation  PERSONAL FACTORS: Anxiety, Depression, DDD, Pre diabetes, PTSD, Sleep apnea, hx of breast cancer.  are also affecting patient's functional outcome.  REHAB POTENTIAL: Good  CLINICAL DECISION MAKING: Evolving/moderate complexity  EVALUATION COMPLEXITY: Moderate    GOALS: Short term PT Goals Target date: 08/15/2023 Pt  will be I and compliant with HEP. Baseline:  Goal status: New Pt will decrease pain by 25% overall Baseline: Goal status: New  Long term PT goals Target date: 09/12/2023 Pt will improve ROM to [facility] with 4/10 pain to improve functional mobility Baseline: Goal status: New Pt will improve  hip/knee strength to at least 5-/5 MMT to improve functional strength Baseline: Goal status: New Pt will improve ODI to at least 15 points functional to show improved function Baseline: Goal status: New Pt will reduce pain by overall 50% overall with usual activity Baseline: Goal status: New Pt will decrease TUG to below 14 seconds in order to demonstrate decreased fall risk Pt will decrease STS by  3.6 seconds in order to demonstrate decreased fall risk.  Pt will be able to ambulate community distances at least 1000 ft with AD and gait pattern without complaints Baseline: Goal status: New  PLAN: PT FREQUENCY: 1-2 times per week   PT DURATION: 6 weeks  PLANNED INTERVENTIONS (unless contraindicated): aquatic PT, Canalith repositioning, cryotherapy, Electrical stimulation, Iontophoresis with 4 mg/ml dexamethasome, Moist heat, traction, Ultrasound, gait training, Therapeutic exercise, balance training, neuromuscular re-education, patient/family education, prosthetic training, manual techniques, passive ROM, dry needling, taping, vasopnuematic device, vestibular, spinal manipulations, joint manipulations  PLAN FOR NEXT SESSION:  incorporate more standing exercises; continue LE strengthening  Claude Manges, PT 08/26/23 5:02 PM

## 2023-08-26 NOTE — Therapy (Signed)
OUTPATIENT PHYSICAL THERAPY SOZO SCREENING NOTE   Patient Name: Kelli Estrada MRN: [id number] DOB:[birth year], 62 y.o., female Today's Date: 08/26/2023  PCP: Frederich Chick., MD REFERRING PROVIDER: Erven Colla, PA-C   PT End of Session - 08/26/23 1450     Visit Number 5    PT Start Time 1350    PT Stop Time 1402    PT Time Calculation (min) 12 min    Activity Tolerance Patient tolerated treatment well    Behavior During Therapy [facility] for tasks assessed/performed             Past Medical History:  Diagnosis Date   Anxiety    Chronic pain    Depression    Family history of breast cancer 08/23/2022   H/O degenerative disc disease    History of radiation therapy    Left breast- 10/25/22-12/11/22- Dr. Antony Blackbird   History of radiation therapy    Left breast-10/25/22-12/11/22- Dr. Antony Blackbird   Pre-diabetes    PTSD (post-traumatic stress disorder)    Sleep apnea    Past Surgical History:  Procedure Laterality Date   ABDOMINAL HYSTERECTOMY     BREAST LUMPECTOMY WITH SENTINEL LYMPH NODE BIOPSY Left 09/14/2022   Procedure: LEFT BREAST CENTRAL LUMPECTOMY WITH SENTINEL LYMPH NODE BX;  Surgeon: Griselda Miner, MD;  Location: [facility];  Service: General;  Laterality: Left;   COLONOSCOPY WITH PROPOFOL N/A 03/15/2016   Procedure: COLONOSCOPY WITH PROPOFOL;  Surgeon: Dorena Cookey, MD;  Location: [facility];  Service: Endoscopy;  Laterality: N/A;   NECK SURGERY     Patient Active Problem List   Diagnosis Date Noted   Genetic testing 08/27/2022   Family history of breast cancer 08/23/2022   Malignant neoplasm of central portion of left breast in female, estrogen receptor positive (HCC) 08/20/2022   Chronic pain of left knee 06/03/2015   Depression, major, recurrent, moderate (HCC) 06/03/2015   Morbid obesity with body mass index of 50 or higher (HCC) 06/03/2015   Vitamin D deficiency 06/03/2015   PTSD (post-traumatic stress disorder) 06/03/2015    Obstructive sleep apnea 06/03/2015    REFERRING DIAG: left breast cancer at risk for lymphedema  THERAPY DIAG:  Aftercare following surgery for neoplasm  PERTINENT HISTORY: Patient was diagnosed on 07/18/2022 with left grade 2 invasive ductal carcinoma breast cancer. It measures 1.9 cm and is located in the central quadrant. It is ER/PR positive and HER2 negative with a Ki67 of 10%. 09/14/22 L breast lumpectomy and SLNB 1/3. Completed radiation. Hx of chronic pain, degenerative disc disease, PTSD   PRECAUTIONS: left UE Lymphedema risk  SUBJECTIVE: Here for SOZO screen  PAIN:  Are you having pain?  Not assessed as she is also being seen after her SOZO screen for another diagnosis  SOZO SCREENING: Patient was assessed today using the SOZO machine to determine the lymphedema index score. This was compared to her baseline score. It was determined that she is within the recommended range when compared to her baseline and no further action is needed at this time. She will continue SOZO screenings. These are done every 3 months for 2 years post operatively followed by every 6 months for 2 years, and then annually.   L-DEX FLOWSHEETS - 08/26/23 1400       L-DEX LYMPHEDEMA SCREENING   Measurement Type Unilateral    L-DEX MEASUREMENT EXTREMITY Upper Extremity    POSITION  Standing    DOMINANT SIDE Right    At Risk Side  Left    BASELINE SCORE (UNILATERAL) 12.5    L-DEX SCORE (UNILATERAL) 0.8    VALUE CHANGE (UNILAT) -[address] [place], [place] 08/26/23 2:52 PM

## 2023-08-30 ENCOUNTER — Encounter (HOSPITAL_BASED_OUTPATIENT_CLINIC_OR_DEPARTMENT_OTHER): Payer: Self-pay | Admitting: Physical Therapy

## 2023-08-30 ENCOUNTER — Ambulatory Visit (HOSPITAL_BASED_OUTPATIENT_CLINIC_OR_DEPARTMENT_OTHER): Payer: 59 | Attending: Nurse Practitioner | Admitting: Physical Therapy

## 2023-08-30 DIAGNOSIS — M5440 Lumbago with sciatica, unspecified side: Secondary | ICD-10-CM | POA: Insufficient documentation

## 2023-08-30 DIAGNOSIS — M6281 Muscle weakness (generalized): Secondary | ICD-10-CM | POA: Insufficient documentation

## 2023-08-30 DIAGNOSIS — M5459 Other low back pain: Secondary | ICD-10-CM

## 2023-08-30 DIAGNOSIS — R262 Difficulty in walking, not elsewhere classified: Secondary | ICD-10-CM | POA: Diagnosis not present

## 2023-08-30 DIAGNOSIS — M545 Low back pain, unspecified: Secondary | ICD-10-CM | POA: Diagnosis not present

## 2023-08-30 NOTE — Therapy (Signed)
OUTPATIENT PHYSICAL THERAPY THORACOLUMBAR TREATMENT NOTE   Patient Name: Kelli Estrada MRN: [id number] DOB:Mar 14, 1961, 62 y.o., female Today's Date: 08/30/2023  END OF SESSION:  PT End of Session - 08/30/23 1258     Visit Number 7    Number of Visits 12    Date for PT Re-Evaluation 09/12/23    Authorization Type [employer] DUAL COMPLETE    Progress Note Due on Visit 10    PT Start Time 1255    PT Stop Time 1338    PT Time Calculation (min) 43 min    Behavior During Therapy [facility] for tasks assessed/performed                Past Medical History:  Diagnosis Date   Anxiety    Chronic pain    Depression    Family history of breast cancer 08/23/2022   H/O degenerative disc disease    History of radiation therapy    Left breast- 10/25/22-12/11/22- Dr. Antony Blackbird   History of radiation therapy    Left breast-10/25/22-12/11/22- Dr. Antony Blackbird   Pre-diabetes    PTSD (post-traumatic stress disorder)    Sleep apnea    Past Surgical History:  Procedure Laterality Date   ABDOMINAL HYSTERECTOMY     BREAST LUMPECTOMY WITH SENTINEL LYMPH NODE BIOPSY Left 09/14/2022   Procedure: LEFT BREAST CENTRAL LUMPECTOMY WITH SENTINEL LYMPH NODE BX;  Surgeon: Griselda Miner, MD;  Location: [facility];  Service: General;  Laterality: Left;   COLONOSCOPY WITH PROPOFOL N/A 03/15/2016   Procedure: COLONOSCOPY WITH PROPOFOL;  Surgeon: Dorena Cookey, MD;  Location: [facility];  Service: Endoscopy;  Laterality: N/A;   NECK SURGERY     Patient Active Problem List   Diagnosis Date Noted   Genetic testing 08/27/2022   Family history of breast cancer 08/23/2022   Malignant neoplasm of central portion of left breast in female, estrogen receptor positive (HCC) 08/20/2022   Chronic pain of left knee 06/03/2015   Depression, major, recurrent, moderate (HCC) 06/03/2015   Morbid obesity with body mass index of 50 or higher (HCC) 06/03/2015   Vitamin D deficiency 06/03/2015   PTSD  (post-traumatic stress disorder) 06/03/2015   Obstructive sleep apnea 06/03/2015    REFERRING PROVIDER: Courtney Paris, NP   REFERRING DIAG: Lumbago with sciatica, unspecified side [M54.40]   Rationale for Evaluation and Treatment: Rehabilitation  THERAPY DIAG:  Other low back pain  Muscle weakness (generalized)  Difficulty in walking, not elsewhere classified  ONSET DATE: Several years.   SUBJECTIVE:  SUBJECTIVE STATEMENT: Pt reports she has a lot going on in her life right now.  She hasn't been sleeping well due to pain.  She reports she is a member of [employer], but doesn't want to go alone.  She is unsure if the last PT facility created her an aquatic HEP.  PERTINENT HISTORY:  Anxiety, Depression, DDD, Pre diabetes, PTSD, Sleep apnea, hx of breast cancer.   PAIN: 08/14/2023 Are you having pain? Yes: NPRS scale: 7/10 Pain location: Throughout neck, back and knees.  Pain description: Sharp pain Aggravating factors: Walking, doing activities around the house. Sitting for prolonged periods of time.  Relieving factors: Nothing at this time.   PRECAUTIONS: None  RED FLAGS: None   WEIGHT BEARING RESTRICTIONS: No  FALLS:  Has patient fallen in last 6 months? 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  10/24:  I want to be able to travel to [place] with my friend  OBJECTIVE:  Note: Objective measures were completed at Evaluation unless otherwise noted.  DIAGNOSTIC FINDINGS:  None in chart. [facility] pt.   PATIENT SURVEYS:  ODI: 29/50 58%   SCREENING FOR RED FLAGS: Bowel or bladder incontinence: Yes: incontinence   COGNITION: Overall cognitive status: Within functional limits for tasks assessed     SENSATION: WFL  POSTURE: rounded shoulders and decreased lumbar lordosis  PALPATION: No tenderness to   LUMBAR ROM:   Overall ROM is WFL with pt reporting pain with movement. Hip hinge with lumbar flexion.   LOWER EXTREMITY ROM:     Overall ROM is WFL with pt reporting pain with movement.   LOWER EXTREMITY MMT:    MMT Right eval Left eval  Hip flexion 4 4+  Knee flexion 4+ 4+  Knee extension 4+ 4+   (Blank rows = not tested)  FUNCTIONAL TESTS:  5 times sit to stand: 19.84 sec Timed up and go (TUG): 19.43 sec with SPC.   08/26/2023 : 286FT  08/30/23: 5x STS 18.17 sec GAIT: Distance walked: 15ft Assistive device utilized: Single point cane Level of assistance: Complete Independence Comments: Pt walks with decreased stride and use of her AD.   TODAY'S TREATMENT:                                                                                                                              DATE:  08/30/2023: Pt seen for aquatic therapy today.  Treatment took place in water 3.5-4.75 ft in depth at the [facility] pool. Temp of water was 91.  Pt entered/exited the pool via stairs independently with bilat rail. * unsupported, while submerged ~70%:  walking forward/ backwards with cues for vertical  trunk and reciprocal arm swing * side stepping with arm addct/ abdct x 2 laps * Holding wall: Hip abdct/ addct x 10, marching, hip openers (knee flexion with hip abdct), single leg clams, leg swings into hip flexion/ hip ext to toe touch x 10 * return to walking forward/ backward  * straddling noodle and holding corner:  cycling, suspended hip abdct/addct, cross country ski motion, cycling * return to 4 ft, vertical suspension with noodle behind  back under arms for decompression  08/26/2023: Seated cervical SNAGs (rotation) x 10 each direction - Lt side more painful Seated Hip flexion & LAQ 2# AW 2 x 10 each Seated ball squeezes x 20 Seated hip abduction with yellow loop x20 Supine lumbar rotation 10 x each side TA contraction + bent knee fall out x 10 Supine TA contraction + hip flexion with yellow loop x 20 each : 286FT ; increased back pain at 2 mins Seated knee flexion green TB 2 x 10 each   08/20/2023: NuStep Level 1 6 minutes- PT present to discuss status Supine abdominal draw in 2 x 10 Supine lumbar rotation 10 x each side Supine ball squeeze  2 x 10 Supine blue band clams 2 x 10 TA contraction + bent knee fall out x 10 Supine with feet on knee wedge marching 2 x 10 each leg Supine hamstring stretch with green strap 2 x 30 sec Manual: Addaday to bilateral lumbar spine to increase circulation. PT present to monitor status.   08/14/2023: NuStep Level 1 5 minutes- PT present to discuss status Supine abdominal draw in 2 x 10 Supine lumbar rotation 10 x each side Supine ball squeeze  2 x 10 Supine green band clams 2 x 10 Supine with feet on knee wedge marching 2 x 10 each leg Supine hamstring stretch with green strap 2 x 30 sec Seated 2# blue plyo ball: hip to hip, Vs, shoulder to hip each side, ear to ear x10 each Seated white foam roll press down x20 4 sec hold Sit to stand 2 x 5 on plinth with airex pad   10/24: Status update and response to initial HEP Supine abdominal draw in 10x Supine lumbar rotation 10x Supine ball squeeze 10x Supine green band clams 10x Supine with feet on knee wedge marching 15x Seated HS stretch dynamically 10x right/left ("feels better in the water") Seated 2# blue plyo ball: hip to hip, Vs, shoulder to hip each side, ear to ear 5x each "This is a workout!"   RPE6-7/10  "Back (pain) is a little more than it was." Seated white foam roll press down 20x      08/02/23: Pt  arrives for aquatic physical therapy. Treatment took place in 3.5-5.5 feet of water. Water temperature was 90 degrees F. Pt entered the pool via stairs, step to step  with heavy use of the rails. Pt requires buoyancy of water for support and to offload joints with strengthening exercises.  Seated water bench with 75% submersion Pt performed seated LE AROM exercises 20x in all planes, concurrent education on water principles (review) and pain assessment. 75% water walking 4x in each direction holding the single buoy hand wts. Bil hip kicks 10x in each direction, pt requires holding onto pool wall for balance. Standing heel lifts 2x10, Seated hamstring stretch 2x 20 sec Bil. Small squat standing against the wall as pt performed shoulder flex/ext with TA activation.   Creating, reviewing, and completing below HEP    PATIENT EDUCATION:  Education details:  reacquainting to aquatic therapy Person educated: Patient Education method: Explanation Education comprehension: verbalized understanding  HOME EXERCISE PROGRAM: Access Code: [email or web address] URL: [email or web address] Date: 08/01/2023 Prepared by: Royal Hawthorn  Exercises - Supine Lower Trunk Rotation  - 2 x daily - 7 x weekly - 2 sets - 10 reps - Seated Hamstring Stretch  - 2 x daily - 7 x weekly - 2 sets - 2 reps - 30 hold - Supine Pelvic Tilt  - 1 x daily - 7 x weekly - 3 sets - 10 reps - Supine Transversus Abdominis Bracing - Hands on Stomach  - 1 x daily - 7 x weekly - 2 sets - 10 reps - 10 hold  ASSESSMENT:  CLINICAL IMPRESSION:  Pt is confident in aquatic setting and able to take direction from therapist on deck.  She is able to walk in water with good balance without UE support. Her 5x STS time improved by 1.67 sec from evaluation; making progress towards LTG 6. Pt reported no change in back pain, and 2 pt reduction in knee pain after session.  Pt Encouraged to visit [facility] pool prior to next pool session (even if to walk in water  and float).  Patient will benefit from skilled PT to address the below impairments and improve overall function.    OBJECTIVE IMPAIRMENTS: decreased activity tolerance, difficulty walking, decreased balance, decreased endurance, decreased mobility, decreased ROM, decreased strength, impaired flexibility, impaired UE/LE use, postural dysfunction, and pain.  ACTIVITY LIMITATIONS: bending, lifting, carry, locomotion, cleaning, community activity, driving, and or occupation  PERSONAL FACTORS: Anxiety, Depression, DDD, Pre diabetes, PTSD, Sleep apnea, hx of breast cancer.  are also affecting patient's functional outcome.  REHAB POTENTIAL: Good  CLINICAL DECISION MAKING: Evolving/moderate complexity  EVALUATION COMPLEXITY: Moderate    GOALS: Short term PT Goals Target date: 08/15/2023 Pt will be I and compliant with HEP. Baseline:  Goal status: New Pt will decrease pain by 25% overall Baseline: Goal status: New  Long term PT goals Target date: 09/12/2023 Pt will improve ROM to [facility] with 4/10 pain to improve functional mobility Baseline: Goal status: New Pt will improve  hip/knee strength to at least 5-/5 MMT to improve functional strength Baseline: Goal status: New Pt will improve ODI to at least 15 points functional to show improved function Baseline: Goal status: New Pt will reduce pain by overall 50% overall with usual activity Baseline: Goal status: New Pt will decrease TUG to below 14 seconds in order to demonstrate decreased fall risk Pt will decrease STS by  3.6 seconds in order to demonstrate decreased fall risk. - in progress, 08/30/23 Pt will be able to ambulate community distances at least 1000 ft with AD and gait pattern without complaints Baseline: Goal status: New  PLAN: PT FREQUENCY: 1-2 times per week   PT DURATION: 6 weeks  PLANNED INTERVENTIONS (unless contraindicated): aquatic PT, Canalith repositioning, cryotherapy, Electrical stimulation, Iontophoresis  with 4 mg/ml dexamethasome, Moist heat, traction, Ultrasound, gait training, Therapeutic exercise, balance training, neuromuscular re-education, patient/family education, prosthetic training, manual techniques, passive ROM, dry needling, taping, vasopnuematic device, vestibular, spinal manipulations, joint manipulations  PLAN FOR NEXT SESSION:  incorporate more standing exercises; continue LE strengthening  Mayer Camel, PTA 08/30/23 1:44 PM [facility] [address] [place], [place], [address] Phone: [phone]   Fax:  [phone]

## 2023-09-04 ENCOUNTER — Other Ambulatory Visit: Payer: Self-pay

## 2023-09-04 ENCOUNTER — Encounter: Payer: Self-pay | Admitting: Physical Therapy

## 2023-09-04 ENCOUNTER — Ambulatory Visit: Payer: 59 | Admitting: Physical Therapy

## 2023-09-04 DIAGNOSIS — M6281 Muscle weakness (generalized): Secondary | ICD-10-CM

## 2023-09-04 DIAGNOSIS — M5459 Other low back pain: Secondary | ICD-10-CM | POA: Diagnosis not present

## 2023-09-04 DIAGNOSIS — R262 Difficulty in walking, not elsewhere classified: Secondary | ICD-10-CM

## 2023-09-04 NOTE — Therapy (Signed)
OUTPATIENT PHYSICAL THERAPY THORACOLUMBAR TREATMENT NOTE   Patient Name: Kelli Estrada MRN: [id number] DOB:1961/06/09, 62 y.o., female Today's Date: 09/04/2023  END OF SESSION:  PT End of Session - 09/04/23 1956     Visit Number 8    Number of Visits 12    Date for PT Re-Evaluation 09/12/23    Authorization Type [employer] DUAL COMPLETE    Progress Note Due on Visit 10    PT Start Time 1145    PT Stop Time 1225    PT Time Calculation (min) 40 min    Activity Tolerance Patient limited by pain;Patient limited by fatigue    Behavior During Therapy [facility] for tasks assessed/performed                 Past Medical History:  Diagnosis Date   Anxiety    Chronic pain    Depression    Family history of breast cancer 08/23/2022   H/O degenerative disc disease    History of radiation therapy    Left breast- 10/25/22-12/11/22- Dr. Antony Blackbird   History of radiation therapy    Left breast-10/25/22-12/11/22- Dr. Antony Blackbird   Pre-diabetes    PTSD (post-traumatic stress disorder)    Sleep apnea    Past Surgical History:  Procedure Laterality Date   ABDOMINAL HYSTERECTOMY     BREAST LUMPECTOMY WITH SENTINEL LYMPH NODE BIOPSY Left 09/14/2022   Procedure: LEFT BREAST CENTRAL LUMPECTOMY WITH SENTINEL LYMPH NODE BX;  Surgeon: Griselda Miner, MD;  Location: [facility];  Service: General;  Laterality: Left;   COLONOSCOPY WITH PROPOFOL N/A 03/15/2016   Procedure: COLONOSCOPY WITH PROPOFOL;  Surgeon: Dorena Cookey, MD;  Location: [facility];  Service: Endoscopy;  Laterality: N/A;   NECK SURGERY     Patient Active Problem List   Diagnosis Date Noted   Genetic testing 08/27/2022   Family history of breast cancer 08/23/2022   Malignant neoplasm of central portion of left breast in female, estrogen receptor positive (HCC) 08/20/2022   Chronic pain of left knee 06/03/2015   Depression, major, recurrent, moderate (HCC) 06/03/2015   Morbid obesity with body mass index of  50 or higher (HCC) 06/03/2015   Vitamin D deficiency 06/03/2015   PTSD (post-traumatic stress disorder) 06/03/2015   Obstructive sleep apnea 06/03/2015    REFERRING PROVIDER: Courtney Paris, NP   REFERRING DIAG: Lumbago with sciatica, unspecified side [M54.40]   Rationale for Evaluation and Treatment: Rehabilitation  THERAPY DIAG:  Other low back pain  Muscle weakness (generalized)  Difficulty in walking, not elsewhere classified  ONSET DATE: Several years.   SUBJECTIVE:  SUBJECTIVE STATEMENT: Last week/week and a half not very good. A lot of pain, just not feeling good. She is glad when she can be in the water as it is helpful to feel better when she moves. RT knee most painful today. Marland Kitchen  PERTINENT HISTORY:  Anxiety, Depression, DDD, Pre diabetes, PTSD, Sleep apnea, hx of breast cancer.   PAIN: 08/14/2023 Are you having pain? Yes: NPRS scale: 7/10 Pain location: Throughout neck, back and knees.  Pain description: Sharp pain Aggravating factors: Walking, doing activities around the house. Sitting for prolonged periods of time.  Relieving factors: Nothing at this time.   PRECAUTIONS: None  RED FLAGS: None   WEIGHT BEARING RESTRICTIONS: No  FALLS:  Has patient fallen in last 6 months? 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  10/24:  I want to be able to travel to [place] with my friend  OBJECTIVE:  Note: Objective measures were completed at Evaluation unless otherwise noted.  DIAGNOSTIC  FINDINGS:  None in chart. [facility] per pt.   PATIENT SURVEYS:  ODI: 29/50 58%   SCREENING FOR RED FLAGS: Bowel or bladder incontinence: Yes: incontinence   COGNITION: Overall cognitive status: Within functional limits for tasks assessed     SENSATION: WFL  POSTURE: rounded shoulders and decreased lumbar lordosis  PALPATION: No tenderness to   LUMBAR ROM:   Overall ROM is WFL with pt reporting pain with movement. Hip hinge with lumbar flexion.   LOWER EXTREMITY ROM:     Overall ROM is WFL with pt reporting pain with movement.   LOWER EXTREMITY MMT:    MMT Right eval Left eval  Hip flexion 4 4+  Knee flexion 4+ 4+  Knee extension 4+ 4+   (Blank rows = not tested)  FUNCTIONAL TESTS:  5 times sit to stand: 19.84 sec Timed up and go (TUG): 19.43 sec with SPC.   08/26/2023 : 286FT  08/30/23: 5x STS 18.17 sec GAIT: Distance walked: 55ft Assistive device utilized: Single point cane Level of assistance: Complete Independence Comments: Pt walks with decreased stride and use of her AD.   TODAY'S TREATMENT:                                                                                                                              DATE:   09/04/23:Pt arrives for aquatic physical therapy. Treatment took place in 3.5-5.5 feet of water. Water temperature was 90 degrees F. Pt entered the pool via stairs, step to step  with heavy use of the rails. Pt requires buoyancy of water for support and to offload joints with strengthening exercises.  Seated water bench with 75% submersion Pt performed seated  LE AROM exercises 20x in all planes, concurrent   pain assessment. 75% water walking 6x in each direction holding the single buoy hand wts. Bil hip kicks 10x in each direction, pt requires holding onto pool wall for balance. Standing heel lifts 2x10, Seated hamstring stretch 2x 20 sec Bil. Small squat standing against the wall as pt performed shoulder flex/ext with  TA activation. High knee march across pool holding large noodle. Decompression float 5 min at conclusion for pain.     08/30/2023: Pt seen for aquatic therapy today.  Treatment took place in water 3.5-4.75 ft in depth at the [facility] pool. Temp of water was 91.  Pt entered/exited the pool via stairs independently with bilat rail. * unsupported, while submerged ~70%:  walking forward/ backwards with cues for vertical trunk and reciprocal arm swing * side stepping with arm addct/ abdct x 2 laps * Holding wall: Hip abdct/ addct x 10, marching, hip openers (knee flexion with hip abdct), single leg clams, leg swings into hip flexion/ hip ext to toe touch x 10 * return to walking forward/ backward  * straddling noodle and holding corner:  cycling, suspended hip abdct/addct, cross country ski motion, cycling * return to 4 ft, vertical suspension with noodle behind back under arms for decompression  08/26/2023: Seated cervical SNAGs (rotation) x 10 each direction - Lt side more painful Seated Hip flexion & LAQ 2# AW 2 x 10 each Seated ball squeezes x 20 Seated hip abduction with yellow loop x20 Supine lumbar rotation 10 x each side TA contraction + bent knee fall out x 10 Supine TA contraction + hip flexion with yellow loop x 20 each : 286FT ; increased back pain at 2 mins Seated knee flexion green TB 2 x 10 each     PATIENT EDUCATION:  Education details: reacquainting to aquatic therapy Person educated: Patient Education method: Explanation Education comprehension: verbalized understanding  HOME EXERCISE PROGRAM: Access Code: [email or web address] URL: [email or web address] Date: 08/01/2023 Prepared by: Royal Hawthorn  Exercises - Supine Lower Trunk Rotation  - 2 x daily - 7 x weekly - 2 sets - 10 reps - Seated Hamstring Stretch  - 2 x daily - 7 x weekly - 2 sets - 2 reps - 30 hold - Supine Pelvic Tilt  - 1 x daily - 7 x weekly - 3 sets - 10 reps - Supine Transversus  Abdominis Bracing - Hands on Stomach  - 1 x daily - 7 x weekly - 2 sets - 10 reps - 10 hold  ASSESSMENT:  CLINICAL IMPRESSION:  Tough day for patient due to high levels of constant pain. Movements were slow today but she was able to accomplish all her exercises. Mood was elevated post session and pain was minimally improved.   OBJECTIVE IMPAIRMENTS: decreased activity tolerance, difficulty walking, decreased balance, decreased endurance, decreased mobility, decreased ROM, decreased strength, impaired flexibility, impaired UE/LE use, postural dysfunction, and pain.  ACTIVITY LIMITATIONS: bending, lifting, carry, locomotion, cleaning, community activity, driving, and or occupation  PERSONAL FACTORS: Anxiety, Depression, DDD, Pre diabetes, PTSD, Sleep apnea, hx of breast cancer.  are also affecting patient's functional outcome.  REHAB POTENTIAL: Good  CLINICAL DECISION MAKING: Evolving/moderate complexity  EVALUATION COMPLEXITY: Moderate    GOALS: Short term PT Goals Target date: 08/15/2023 Pt will be I and compliant with HEP. Baseline:  Goal status: New Pt will decrease pain by 25% overall Baseline: Goal status: New  Long term PT goals Target date: 09/12/2023 Pt will improve ROM to  WFL with 4/10 pain to improve functional mobility Baseline: Goal status: New Pt will improve  hip/knee strength to at least 5-/5 MMT to improve functional strength Baseline: Goal status: New Pt will improve ODI to at least 15 points functional to show improved function Baseline: Goal status: New Pt will reduce pain by overall 50% overall with usual activity Baseline: Goal status: New Pt will decrease TUG to below 14 seconds in order to demonstrate decreased fall risk Pt will decrease STS by  3.6 seconds in order to demonstrate decreased fall risk. - in progress, 08/30/23 Pt will be able to ambulate community distances at least 1000 ft with AD and gait pattern without complaints Baseline: Goal  status: New  PLAN: PT FREQUENCY: 1-2 times per week   PT DURATION: 6 weeks  PLANNED INTERVENTIONS (unless contraindicated): aquatic PT, Canalith repositioning, cryotherapy, Electrical stimulation, Iontophoresis with 4 mg/ml dexamethasome, Moist heat, traction, Ultrasound, gait training, Therapeutic exercise, balance training, neuromuscular re-education, patient/family education, prosthetic training, manual techniques, passive ROM, dry needling, taping, vasopnuematic device, vestibular, spinal manipulations, joint manipulations  PLAN FOR NEXT SESSION:  incorporate more standing exercises; continue LE strengthening  Ane Payment, PTA 09/04/23 7:57 PM   [facility] [address] [place], [place], [address] Phone: [phone]   Fax:  [phone]

## 2023-09-06 ENCOUNTER — Ambulatory Visit: Payer: 59 | Admitting: Physical Therapy

## 2023-09-06 ENCOUNTER — Other Ambulatory Visit: Payer: Self-pay

## 2023-09-06 DIAGNOSIS — R262 Difficulty in walking, not elsewhere classified: Secondary | ICD-10-CM

## 2023-09-06 DIAGNOSIS — M6281 Muscle weakness (generalized): Secondary | ICD-10-CM

## 2023-09-06 DIAGNOSIS — M5459 Other low back pain: Secondary | ICD-10-CM

## 2023-09-06 DIAGNOSIS — Z483 Aftercare following surgery for neoplasm: Secondary | ICD-10-CM

## 2023-09-06 NOTE — Therapy (Signed)
OUTPATIENT PHYSICAL THERAPY THORACOLUMBAR TREATMENT NOTE   Patient Name: Kelli Estrada MRN: [id number] DOB:Aug 23, 1961, 62 y.o., female Today's Date: 09/06/2023  END OF SESSION:  PT End of Session - 09/06/23 1352     Visit Number 9    Number of Visits 12    Date for PT Re-Evaluation 09/12/23    Authorization Type [employer] DUAL COMPLETE    Progress Note Due on Visit 10    PT Start Time 1305    PT Stop Time 1350    PT Time Calculation (min) 45 min    Activity Tolerance Patient tolerated treatment well    Behavior During Therapy [facility] for tasks assessed/performed                  Past Medical History:  Diagnosis Date   Anxiety    Chronic pain    Depression    Family history of breast cancer 08/23/2022   H/O degenerative disc disease    History of radiation therapy    Left breast- 10/25/22-12/11/22- Dr. Antony Blackbird   History of radiation therapy    Left breast-10/25/22-12/11/22- Dr. Antony Blackbird   Pre-diabetes    PTSD (post-traumatic stress disorder)    Sleep apnea    Past Surgical History:  Procedure Laterality Date   ABDOMINAL HYSTERECTOMY     BREAST LUMPECTOMY WITH SENTINEL LYMPH NODE BIOPSY Left 09/14/2022   Procedure: LEFT BREAST CENTRAL LUMPECTOMY WITH SENTINEL LYMPH NODE BX;  Surgeon: Griselda Miner, MD;  Location: [facility];  Service: General;  Laterality: Left;   COLONOSCOPY WITH PROPOFOL N/A 03/15/2016   Procedure: COLONOSCOPY WITH PROPOFOL;  Surgeon: Dorena Cookey, MD;  Location: [facility];  Service: Endoscopy;  Laterality: N/A;   NECK SURGERY     Patient Active Problem List   Diagnosis Date Noted   Genetic testing 08/27/2022   Family history of breast cancer 08/23/2022   Malignant neoplasm of central portion of left breast in female, estrogen receptor positive (HCC) 08/20/2022   Chronic pain of left knee 06/03/2015   Depression, major, recurrent, moderate (HCC) 06/03/2015   Morbid obesity with body mass index of 50 or higher  (HCC) 06/03/2015   Vitamin D deficiency 06/03/2015   PTSD (post-traumatic stress disorder) 06/03/2015   Obstructive sleep apnea 06/03/2015    REFERRING PROVIDER: Courtney Paris, NP   REFERRING DIAG: Lumbago with sciatica, unspecified side [M54.40]   Rationale for Evaluation and Treatment: Rehabilitation  THERAPY DIAG:  Other low back pain  Muscle weakness (generalized)  Difficulty in walking, not elsewhere classified  Aftercare following surgery for neoplasm  ONSET DATE: Several years.   SUBJECTIVE:  SUBJECTIVE STATEMENT: I felt good after the last pool session. Today my back hurts more than my knees today. Marland Kitchen  PERTINENT HISTORY:  Anxiety, Depression, DDD, Pre diabetes, PTSD, Sleep apnea, hx of breast cancer.   PAIN: 08/14/2023 Are you having pain? Yes: NPRS scale: 7/10 Pain location: Throughout neck, back and knees.  Pain description: Sharp pain Aggravating factors: Walking, doing activities around the house. Sitting for prolonged periods of time.  Relieving factors: Nothing at this time.   PRECAUTIONS: None  RED FLAGS: None   WEIGHT BEARING RESTRICTIONS: No  FALLS:  Has patient fallen in last 6 months? 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  10/24:  I want to be able to travel to [place] with my friend  OBJECTIVE:  Note: Objective measures were completed at Evaluation unless otherwise noted.  DIAGNOSTIC FINDINGS:  None in chart. [facility] per pt.   PATIENT SURVEYS:  ODI:  29/50 58%   SCREENING FOR RED FLAGS: Bowel or bladder incontinence: Yes: incontinence   COGNITION: Overall cognitive status: Within functional limits for tasks assessed     SENSATION: WFL  POSTURE: rounded shoulders and decreased lumbar lordosis  PALPATION: No tenderness to   LUMBAR ROM:   Overall ROM is WFL with pt reporting pain with movement. Hip hinge with lumbar flexion.   LOWER EXTREMITY ROM:     Overall ROM is WFL with pt reporting pain with movement.   LOWER EXTREMITY MMT:    MMT Right eval Left eval  Hip flexion 4 4+  Knee flexion 4+ 4+  Knee extension 4+ 4+   (Blank rows = not tested)  FUNCTIONAL TESTS:  5 times sit to stand: 19.84 sec Timed up and go (TUG): 19.43 sec with SPC.   08/26/2023 : 286FT  08/30/23: 5x STS 18.17 sec GAIT: Distance walked: 19ft Assistive device utilized: Single point cane Level of assistance: Complete Independence Comments: Pt walks with decreased stride and use of her AD.   TODAY'S TREATMENT:                                                                                                                              DATE:   09/04/23:Pt arrives for aquatic physical therapy. Treatment took place in 3.5-5.5 feet of water. Water temperature was 90 degrees F. Pt entered the pool via stairs, step to step  with heavy use of the rails. Pt requires buoyancy of water for support and to offload joints with strengthening exercises.  Seated water bench with 75% submersion Pt performed seated LE AROM exercises 20x in all planes, concurrent   pain assessment. 75% water walking 6x in each direction holding the single buoy hand  wts. Bil hip kicks 15x in each direction, pt requires holding onto pool wall for balance. Standing heel lifts 2x10, Seated hamstring stretch 2x 20 sec Bil. High knee march across pool holding large noodle.Underwater bicycle in horseback with hand floats for additional trunk stability with SBA 5 min. Decompression float  8 min at conclusion for pain.     08/30/2023: Pt seen for aquatic therapy today.  Treatment took place in water 3.5-4.75 ft in depth at the [facility] pool. Temp of water was 91.  Pt entered/exited the pool via stairs independently with bilat rail. * unsupported, while submerged ~70%:  walking forward/ backwards with cues for vertical trunk and reciprocal arm swing * side stepping with arm addct/ abdct x 2 laps * Holding wall: Hip abdct/ addct x 10, marching, hip openers (knee flexion with hip abdct), single leg clams, leg swings into hip flexion/ hip ext to toe touch x 10 * return to walking forward/ backward  * straddling noodle and holding corner:  cycling, suspended hip abdct/addct, cross country ski motion, cycling * return to 4 ft, vertical suspension with noodle behind back under arms for decompression  08/26/2023: Seated cervical SNAGs (rotation) x 10 each direction - Lt side more painful Seated Hip flexion & LAQ 2# AW 2 x 10 each Seated ball squeezes x 20 Seated hip abduction with yellow loop x20 Supine lumbar rotation 10 x each side TA contraction + bent knee fall out x 10 Supine TA contraction + hip flexion with yellow loop x 20 each : 286FT ; increased back pain at 2 mins Seated knee flexion green TB 2 x 10 each     PATIENT EDUCATION:  Education details: reacquainting to aquatic therapy Person educated: Patient Education method: Explanation Education comprehension: verbalized understanding  HOME EXERCISE PROGRAM: Access Code: [email or web address] URL: [email or web address] Date: 08/01/2023 Prepared by: Royal Hawthorn  Exercises - Supine Lower Trunk Rotation  - 2 x daily - 7 x weekly - 2 sets - 10 reps - Seated Hamstring Stretch  - 2 x daily - 7 x weekly - 2 sets - 2 reps - 30 hold - Supine Pelvic Tilt  - 1 x daily - 7 x weekly - 3 sets - 10 reps - Supine Transversus Abdominis Bracing - Hands on Stomach  - 1 x daily - 7 x weekly - 2 sets - 10 reps -  10 hold  ASSESSMENT:  CLINICAL IMPRESSION:  Better day for pt pain wise compared to Wednesday. Her back was hurting more than her knees per pt report but this pain did not interfere with any of the exercises. Pt slowly adding load to her session.  OBJECTIVE IMPAIRMENTS: decreased activity tolerance, difficulty walking, decreased balance, decreased endurance, decreased mobility, decreased ROM, decreased strength, impaired flexibility, impaired UE/LE use, postural dysfunction, and pain.  ACTIVITY LIMITATIONS: bending, lifting, carry, locomotion, cleaning, community activity, driving, and or occupation  PERSONAL FACTORS: Anxiety, Depression, DDD, Pre diabetes, PTSD, Sleep apnea, hx of breast cancer.  are also affecting patient's functional outcome.  REHAB POTENTIAL: Good  CLINICAL DECISION MAKING: Evolving/moderate complexity  EVALUATION COMPLEXITY: Moderate    GOALS: Short term PT Goals Target date: 08/15/2023 Pt will be I and compliant with HEP. Baseline:  Goal status: New Pt will decrease pain by 25% overall Baseline: Goal status: New  Long term PT goals Target date: 09/12/2023 Pt will improve ROM to [facility] with 4/10 pain to improve functional mobility Baseline: Goal status: New Pt will improve  hip/knee strength to at least  5-/5 MMT to improve functional strength Baseline: Goal status: New Pt will improve ODI to at least 15 points functional to show improved function Baseline: Goal status: New Pt will reduce pain by overall 50% overall with usual activity Baseline: Goal status: New Pt will decrease TUG to below 14 seconds in order to demonstrate decreased fall risk Pt will decrease STS by  3.6 seconds in order to demonstrate decreased fall risk. - in progress, 08/30/23 Pt will be able to ambulate community distances at least 1000 ft with AD and gait pattern without complaints Baseline: Goal status: New  PLAN: PT FREQUENCY: 1-2 times per week   PT DURATION: 6  weeks  PLANNED INTERVENTIONS (unless contraindicated): aquatic PT, Canalith repositioning, cryotherapy, Electrical stimulation, Iontophoresis with 4 mg/ml dexamethasome, Moist heat, traction, Ultrasound, gait training, Therapeutic exercise, balance training, neuromuscular re-education, patient/family education, prosthetic training, manual techniques, passive ROM, dry needling, taping, vasopnuematic device, vestibular, spinal manipulations, joint manipulations  PLAN FOR NEXT SESSION:  Pt has ERO in next 2 visits  Ane Payment, PTA 09/06/23 1:53 PM   [facility] [address] [place], [place], [address] Phone: [phone]   Fax:  [phone]

## 2023-09-10 ENCOUNTER — Other Ambulatory Visit (HOSPITAL_COMMUNITY): Payer: Self-pay

## 2023-09-10 NOTE — Progress Notes (Signed)
Specialty Pharmacy Refill Coordination Note  Kelli Estrada is a 62 y.o. female contacted today regarding refills of specialty medication(s) Abemaciclib   Patient requested Delivery   Delivery date: 09/13/23   Verified address: [address] [place], [place] [address]   Medication will be filled on 09/11/23.

## 2023-09-10 NOTE — Therapy (Unsigned)
OUTPATIENT PHYSICAL THERAPY THORACOLUMBAR TREATMENT NOTE   Patient Name: Kelli Estrada MRN: [id number] DOB:Apr 30, 1961, 62 y.o., female Today's Date: 09/11/2023  END OF SESSION:  PT End of Session - 09/11/23 1204     Visit Number 10    Number of Visits 12    Date for PT Re-Evaluation 09/12/23    Authorization Type [employer] DUAL COMPLETE    Progress Note Due on Visit 10    PT Start Time 1145    PT Stop Time 1230    PT Time Calculation (min) 45 min    Activity Tolerance Patient tolerated treatment well    Behavior During Therapy [facility] for tasks assessed/performed                   Past Medical History:  Diagnosis Date   Anxiety    Chronic pain    Depression    Family history of breast cancer 08/23/2022   H/O degenerative disc disease    History of radiation therapy    Left breast- 10/25/22-12/11/22- Dr. Antony Blackbird   History of radiation therapy    Left breast-10/25/22-12/11/22- Dr. Antony Blackbird   Pre-diabetes    PTSD (post-traumatic stress disorder)    Sleep apnea    Past Surgical History:  Procedure Laterality Date   ABDOMINAL HYSTERECTOMY     BREAST LUMPECTOMY WITH SENTINEL LYMPH NODE BIOPSY Left 09/14/2022   Procedure: LEFT BREAST CENTRAL LUMPECTOMY WITH SENTINEL LYMPH NODE BX;  Surgeon: Griselda Miner, MD;  Location: [facility];  Service: General;  Laterality: Left;   COLONOSCOPY WITH PROPOFOL N/A 03/15/2016   Procedure: COLONOSCOPY WITH PROPOFOL;  Surgeon: Dorena Cookey, MD;  Location: [facility];  Service: Endoscopy;  Laterality: N/A;   NECK SURGERY     Patient Active Problem List   Diagnosis Date Noted   Genetic testing 08/27/2022   Family history of breast cancer 08/23/2022   Malignant neoplasm of central portion of left breast in female, estrogen receptor positive (HCC) 08/20/2022   Chronic pain of left knee 06/03/2015   Depression, major, recurrent, moderate (HCC) 06/03/2015   Morbid obesity with body mass index of 50 or higher  (HCC) 06/03/2015   Vitamin D deficiency 06/03/2015   PTSD (post-traumatic stress disorder) 06/03/2015   Obstructive sleep apnea 06/03/2015    REFERRING PROVIDER: Courtney Paris, NP   REFERRING DIAG: Lumbago with sciatica, unspecified side [M54.40]   Rationale for Evaluation and Treatment: Rehabilitation  THERAPY DIAG:  Other low back pain  Muscle weakness (generalized)  Difficulty in walking, not elsewhere classified  Aftercare following surgery for neoplasm  Abnormal posture  ONSET DATE: Several years.   SUBJECTIVE:  SUBJECTIVE STATEMENT:I feel horrible today. I woke up this AM just aching from head to toe. Home life has been stressful  PERTINENT HISTORY:  Anxiety, Depression, DDD, Pre diabetes, PTSD, Sleep apnea, hx of breast cancer.   PAIN: 08/14/2023 Are you having pain? Yes: NPRS scale: 9/10 Pain location: Throughout neck, back and knees.  Pain description: Sharp pain Aggravating factors: Walking, doing activities around the house. Sitting for prolonged periods of time.  Relieving factors: Nothing at this time.   PRECAUTIONS: None  RED FLAGS: None   WEIGHT BEARING RESTRICTIONS: No  FALLS:  Has patient fallen in last 6 months? 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  10/24:  I want to be able to travel to [place] with my friend  OBJECTIVE:  Note: Objective measures were completed at Evaluation unless otherwise noted.  DIAGNOSTIC FINDINGS:  None in chart. [facility] per  pt.   PATIENT SURVEYS:  ODI: 29/50 58%   SCREENING FOR RED FLAGS: Bowel or bladder incontinence: Yes: incontinence   COGNITION: Overall cognitive status: Within functional limits for tasks assessed     SENSATION: WFL  POSTURE: rounded shoulders and decreased lumbar lordosis  PALPATION: No tenderness to   LUMBAR ROM:   Overall ROM is WFL with pt reporting pain with movement. Hip hinge with lumbar flexion.   LOWER EXTREMITY ROM:     Overall ROM is WFL with pt reporting pain with movement.   LOWER EXTREMITY MMT:    MMT Right eval Left eval  Hip flexion 4 4+  Knee flexion 4+ 4+  Knee extension 4+ 4+   (Blank rows = not tested)  FUNCTIONAL TESTS:  5 times sit to stand: 19.84 sec Timed up and go (TUG): 19.43 sec with SPC.   08/26/2023 : 286FT  08/30/23: 5x STS 18.17 sec GAIT: Distance walked: 72ft Assistive device utilized: Single point cane Level of assistance: Complete Independence Comments: Pt walks with decreased stride and use of her AD.   TODAY'S TREATMENT:                                                                                                                              DATE:   09/11/23:Pt arrives for aquatic physical therapy. Treatment took place in 3.5-5.5 feet of water. Water temperature was 90 degrees F. Pt entered the pool via stairs, step to step  with heavy use of the rails. Pt requires buoyancy of water for support and to offload joints with strengthening exercises.  Seated water bench with 75% submersion Pt performed seated LE AROM exercises 20x in all planes, concurrent   pain assessment. 75% water walking 8x in each direction holding the single buoy  hand wts encouraging some UE push/pull today. Bil hip kicks 20x in each direction, pt requires holding onto pool wall for balance. Standing heel lifts 2x15, Seated hamstring stretch 2x 20 sec Bil. High knee march across pool holding large noodle. Decompression float 10 min for pain/stress  management with intermittent underwater LE bicycle.    09/04/23:Pt arrives for aquatic physical therapy. Treatment took place in 3.5-5.5 feet of water. Water temperature was 90 degrees F. Pt entered the pool via stairs, step to step  with heavy use of the rails. Pt requires buoyancy of water for support and to offload joints with strengthening exercises.  Seated water bench with 75% submersion Pt performed seated LE AROM exercises 20x in all planes, concurrent   pain assessment. 75% water walking 6x in each direction holding the single buoy hand wts. Bil hip kicks 15x in each direction, pt requires holding onto pool wall for balance. Standing heel lifts 2x10, Seated hamstring stretch 2x 20 sec Bil. High knee march across pool holding large noodle.Underwater bicycle in horseback with hand floats for additional trunk stability with SBA 5 min. Decompression float 8 min at conclusion for pain.     08/30/2023: Pt seen for aquatic therapy today.  Treatment took place in water 3.5-4.75 ft in depth at the [facility] pool. Temp of water was 91.  Pt entered/exited the pool via stairs independently with bilat rail. * unsupported, while submerged ~70%:  walking forward/ backwards with cues for vertical trunk and reciprocal arm swing * side stepping with arm addct/ abdct x 2 laps * Holding wall: Hip abdct/ addct x 10, marching, hip openers (knee flexion with hip abdct), single leg clams, leg swings into hip flexion/ hip ext to toe touch x 10 * return to walking forward/ backward  * straddling noodle and holding corner:  cycling, suspended hip abdct/addct, cross country ski motion, cycling * return to 4 ft, vertical suspension with noodle behind back under arms for decompression   PATIENT EDUCATION:  Education details: reacquainting to aquatic therapy Person educated: Patient Education method: Explanation Education comprehension: verbalized understanding  HOME EXERCISE PROGRAM: Access Code:  [facility] URL: [email or web address] Date: 08/01/2023 Prepared by: Royal Hawthorn  Exercises - Supine Lower Trunk Rotation  - 2 x daily - 7 x weekly - 2 sets - 10 reps - Seated Hamstring Stretch  - 2 x daily - 7 x weekly - 2 sets - 2 reps - 30 hold - Supine Pelvic Tilt  - 1 x daily - 7 x weekly - 3 sets - 10 reps - Supine Transversus Abdominis Bracing - Hands on Stomach  - 1 x daily - 7 x weekly - 2 sets - 10 reps - 10 hold  ASSESSMENT:  CLINICAL IMPRESSION:  Pt arrives complaining of a deep ache from her head to her toes. She also talks a lot about high stress levels at home this week. Despite widespread pain reports pt was able to continue with her exercise program and even walked a few more laps today. Pain did not appear to worsen.  OBJECTIVE IMPAIRMENTS: decreased activity tolerance, difficulty walking, decreased balance, decreased endurance, decreased mobility, decreased ROM, decreased strength, impaired flexibility, impaired UE/LE use, postural dysfunction, and pain.  ACTIVITY LIMITATIONS: bending, lifting, carry, locomotion, cleaning, community activity, driving, and or occupation  PERSONAL FACTORS: Anxiety, Depression, DDD, Pre diabetes, PTSD, Sleep apnea, hx of breast cancer.  are also affecting patient's functional outcome.  REHAB POTENTIAL: Good  CLINICAL DECISION MAKING: Evolving/moderate complexity  EVALUATION COMPLEXITY: Moderate  GOALS: Short term PT Goals Target date: 08/15/2023 Pt will be I and compliant with HEP. Baseline:  Goal status: New Pt will decrease pain by 25% overall Baseline: Goal status: New  Long term PT goals Target date: 09/12/2023 Pt will improve ROM to [facility] with 4/10 pain to improve functional mobility Baseline: Goal status: New Pt will improve  hip/knee strength to at least 5-/5 MMT to improve functional strength Baseline: Goal status: New Pt will improve ODI to at least 15 points functional to show improved  function Baseline: Goal status: New Pt will reduce pain by overall 50% overall with usual activity Baseline: Goal status: New Pt will decrease TUG to below 14 seconds in order to demonstrate decreased fall risk Pt will decrease STS by  3.6 seconds in order to demonstrate decreased fall risk. - in progress, 08/30/23 Pt will be able to ambulate community distances at least 1000 ft with AD and gait pattern without complaints Baseline: Goal status: New  PLAN: PT FREQUENCY: 1-2 times per week   PT DURATION: 6 weeks  PLANNED INTERVENTIONS (unless contraindicated): aquatic PT, Canalith repositioning, cryotherapy, Electrical stimulation, Iontophoresis with 4 mg/ml dexamethasome, Moist heat, traction, Ultrasound, gait training, Therapeutic exercise, balance training, neuromuscular re-education, patient/family education, prosthetic training, manual techniques, passive ROM, dry needling, taping, vasopnuematic device, vestibular, spinal manipulations, joint manipulations  PLAN FOR NEXT SESSION:  Pt has ERO next visit  Ane Payment, PTA 09/11/23 12:05 PM   [facility] [address] [place], [place], [address] Phone: [phone]   Fax:  [phone]

## 2023-09-10 NOTE — Progress Notes (Signed)
Specialty Pharmacy Ongoing Clinical Assessment Note  Kelli Estrada is a 62 y.o. female who is being followed by the specialty pharmacy service for RxSp Oncology   Patient's specialty medication(s) reviewed today: Abemaciclib   Missed doses in the last 4 weeks: 2 (forgot)   Patient/Caregiver did not have any additional questions or concerns.   Therapeutic benefit summary: Patient is achieving benefit   Adverse events/side effects summary: Experienced adverse events/side effects (low bone density, Pt notified MD of results)   Patient's therapy is appropriate to: Continue    Goals Addressed             This Visit's Progress    Achieve or maintain remission       Patient is on track. Patient will maintain adherence.  Most recent mammogram showed no signs of malignancy (08/02/23).         Follow up:  6 months  Servando Snare Specialty Pharmacist

## 2023-09-11 ENCOUNTER — Other Ambulatory Visit: Payer: Self-pay

## 2023-09-11 ENCOUNTER — Encounter: Payer: Self-pay | Admitting: Physical Therapy

## 2023-09-11 ENCOUNTER — Ambulatory Visit: Payer: 59 | Admitting: Physical Therapy

## 2023-09-11 DIAGNOSIS — Z483 Aftercare following surgery for neoplasm: Secondary | ICD-10-CM

## 2023-09-11 DIAGNOSIS — R262 Difficulty in walking, not elsewhere classified: Secondary | ICD-10-CM

## 2023-09-11 DIAGNOSIS — M5459 Other low back pain: Secondary | ICD-10-CM

## 2023-09-11 DIAGNOSIS — M6281 Muscle weakness (generalized): Secondary | ICD-10-CM

## 2023-09-11 DIAGNOSIS — R293 Abnormal posture: Secondary | ICD-10-CM

## 2023-09-18 ENCOUNTER — Encounter: Payer: Self-pay | Admitting: Physical Therapy

## 2023-09-18 ENCOUNTER — Ambulatory Visit: Payer: 59 | Attending: Nurse Practitioner | Admitting: Physical Therapy

## 2023-09-18 DIAGNOSIS — Z483 Aftercare following surgery for neoplasm: Secondary | ICD-10-CM | POA: Diagnosis present

## 2023-09-18 DIAGNOSIS — M5459 Other low back pain: Secondary | ICD-10-CM | POA: Insufficient documentation

## 2023-09-18 DIAGNOSIS — M6281 Muscle weakness (generalized): Secondary | ICD-10-CM | POA: Insufficient documentation

## 2023-09-18 DIAGNOSIS — R262 Difficulty in walking, not elsewhere classified: Secondary | ICD-10-CM | POA: Diagnosis present

## 2023-09-18 DIAGNOSIS — R293 Abnormal posture: Secondary | ICD-10-CM | POA: Insufficient documentation

## 2023-09-18 NOTE — Therapy (Signed)
OUTPATIENT PHYSICAL THERAPY THORACOLUMBAR TREATMENT NOTE / RE-CERTIFICATION   Patient Name: Kelli Estrada MRN: [id number] DOB:1961-08-17, 62 y.o., female Today's Date: 09/18/2023   END OF SESSION:  PT End of Session - 09/18/23 1359     Visit Number 11    Date for PT Re-Evaluation 10/30/23    Authorization Type [employer] DUAL COMPLETE    Progress Note Due on Visit 20    PT Start Time 1228    PT Stop Time 1317    PT Time Calculation (min) 49 min    Activity Tolerance Patient tolerated treatment well    Behavior During Therapy [facility] for tasks assessed/performed                    Past Medical History:  Diagnosis Date   Anxiety    Chronic pain    Depression    Family history of breast cancer 08/23/2022   H/O degenerative disc disease    History of radiation therapy    Left breast- 10/25/22-12/11/22- Dr. Antony Blackbird   History of radiation therapy    Left breast-10/25/22-12/11/22- Dr. Antony Blackbird   Pre-diabetes    PTSD (post-traumatic stress disorder)    Sleep apnea    Past Surgical History:  Procedure Laterality Date   ABDOMINAL HYSTERECTOMY     BREAST LUMPECTOMY WITH SENTINEL LYMPH NODE BIOPSY Left 09/14/2022   Procedure: LEFT BREAST CENTRAL LUMPECTOMY WITH SENTINEL LYMPH NODE BX;  Surgeon: Griselda Miner, MD;  Location: [facility];  Service: General;  Laterality: Left;   COLONOSCOPY WITH PROPOFOL N/A 03/15/2016   Procedure: COLONOSCOPY WITH PROPOFOL;  Surgeon: Dorena Cookey, MD;  Location: [facility];  Service: Endoscopy;  Laterality: N/A;   NECK SURGERY     Patient Active Problem List   Diagnosis Date Noted   Genetic testing 08/27/2022   Family history of breast cancer 08/23/2022   Malignant neoplasm of central portion of left breast in female, estrogen receptor positive (HCC) 08/20/2022   Chronic pain of left knee 06/03/2015   Depression, major, recurrent, moderate (HCC) 06/03/2015   Morbid obesity with body mass index of 50 or higher  (HCC) 06/03/2015   Vitamin D deficiency 06/03/2015   PTSD (post-traumatic stress disorder) 06/03/2015   Obstructive sleep apnea 06/03/2015    REFERRING PROVIDER: Courtney Paris, NP   REFERRING DIAG: Lumbago with sciatica, unspecified side [M54.40]   Rationale for Evaluation and Treatment: Rehabilitation  THERAPY DIAG:  Other low back pain  Muscle weakness (generalized)  Difficulty in walking, not elsewhere classified  Aftercare following surgery for neoplasm  Abnormal posture  ONSET DATE: Several years.   SUBJECTIVE:  SUBJECTIVE STATEMENT:I don't know how I am feeling. My back is horrible  PERTINENT HISTORY:  Anxiety, Depression, DDD, Pre diabetes, PTSD, Sleep apnea, hx of breast cancer.   PAIN: 09/18/2023 Are you having pain? Yes: NPRS scale: 9/10 Pain location: Throughout neck, back and knees.  Pain description: Sharp pain Aggravating factors: Walking, doing activities around the house. Sitting for prolonged periods of time.  Relieving factors: Nothing at this time.   PRECAUTIONS: None  RED FLAGS: None   WEIGHT BEARING RESTRICTIONS: No  FALLS:  Has patient fallen in last 6 months? 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  10/24:  I want to be able to travel to [place] with my friend  OBJECTIVE:  Note: Objective measures were completed at Evaluation unless otherwise noted.  DIAGNOSTIC FINDINGS:  None in chart. [facility] per pt.   PATIENT SURVEYS:  ODI: 29/50 58%   09/18/2023: ODI 25/50 50%  SCREENING FOR RED FLAGS: Bowel or bladder incontinence: Yes: incontinence   COGNITION: Overall cognitive status: Within functional limits for tasks assessed     SENSATION: WFL  POSTURE: rounded shoulders and decreased lumbar lordosis  PALPATION: No tenderness to   LUMBAR ROM:   Overall ROM is WFL with pt reporting pain with movement. Hip hinge with lumbar flexion.   LOWER EXTREMITY ROM:     Overall ROM is WFL with pt reporting pain with movement.   LOWER EXTREMITY MMT:    MMT Right eval Left eval  Hip flexion 4 4+  Knee flexion 4+ 4+  Knee extension 4+ 4+   (Blank rows = not tested)  FUNCTIONAL TESTS:  5 times sit to stand: 19.84 sec Timed up and go (TUG): 19.43 sec with SPC.   08/26/2023 : 286FT  08/30/23: 5x STS 18.17 sec  09/18/2023      5STS: 18.76 sec No UE support      TUG:15.66 sec with SPC  GAIT: Distance walked: 60ft Assistive device utilized: Single point cane Level of assistance: Complete Independence Comments: Pt walks with decreased stride and use of her AD.   TODAY'S TREATMENT:                                                                                                                              DATE:   09/18/2023 NuStep Level 2 6 mins- PT present to discuss status & POC 5 STS: 18.76 No UE support TUG: 15.66 sec with SPC ODI:25/50 50% Seated marching 2# AW 2 x 10 bilateral  Seated LAQ 2# AW 2 x 10 bilateral  3 way SB stretch x 8 3 sec hold Discussion about mental health resources & mindfulness apps/ videos   09/11/23:Pt  arrives for aquatic physical therapy. Treatment took place in 3.5-5.5 feet of water. Water temperature was 90 degrees F. Pt entered the pool via stairs, step to step  with heavy use of the rails. Pt requires buoyancy of water for support and to offload joints with strengthening exercises.  Seated water bench with 75% submersion Pt performed seated LE AROM exercises 20x in all planes,  concurrent   pain assessment. 75% water walking 8x in each direction holding the single buoy hand wts encouraging some UE push/pull today. Bil hip kicks 20x in each direction, pt requires holding onto pool wall for balance. Standing heel lifts 2x15, Seated hamstring stretch 2x 20 sec Bil. High knee march across pool holding large noodle. Decompression float 10 min for pain/stress management with intermittent underwater LE bicycle.    09/04/23:Pt arrives for aquatic physical therapy. Treatment took place in 3.5-5.5 feet of water. Water temperature was 90 degrees F. Pt entered the pool via stairs, step to step  with heavy use of the rails. Pt requires buoyancy of water for support and to offload joints with strengthening exercises.  Seated water bench with 75% submersion Pt performed seated LE AROM exercises 20x in all planes, concurrent   pain assessment. 75% water walking 6x in each direction holding the single buoy hand wts. Bil hip kicks 15x in each direction, pt requires holding onto pool wall for balance. Standing heel lifts 2x10, Seated hamstring stretch 2x 20 sec Bil. High knee march across pool holding large noodle.Underwater bicycle in horseback with hand floats for additional trunk stability with SBA 5 min. Decompression float 8 min at conclusion for pain.     08/30/2023: Pt seen for aquatic therapy today.  Treatment took place in water 3.5-4.75 ft in depth at the [facility] pool. Temp of water was 91.  Pt entered/exited the pool via stairs independently with bilat rail. * unsupported, while submerged ~70%:  walking forward/ backwards with cues for vertical trunk and reciprocal arm swing * side stepping with arm addct/ abdct x 2 laps * Holding wall: Hip abdct/ addct x 10, marching, hip openers (knee flexion with hip abdct), single leg clams, leg swings into hip flexion/ hip ext to toe touch x 10 * return to walking forward/ backward  * straddling noodle and holding corner:   cycling, suspended hip abdct/addct, cross country ski motion, cycling * return to 4 ft, vertical suspension with noodle behind back under arms for decompression   PATIENT EDUCATION:  Education details: reacquainting to aquatic therapy Person educated: Patient Education method: Explanation Education comprehension: verbalized understanding  HOME EXERCISE PROGRAM: Access Code: [facility] URL: [email or web address] Date: 08/01/2023 Prepared by: Royal Hawthorn  Exercises - Supine Lower Trunk Rotation  - 2 x daily - 7 x weekly - 2 sets - 10 reps - Seated Hamstring Stretch  - 2 x daily - 7 x weekly - 2 sets - 2 reps - 30 hold - Supine Pelvic Tilt  - 1 x daily - 7 x weekly - 3 sets - 10 reps - Supine Transversus Abdominis Bracing - Hands on Stomach  - 1 x daily - 7 x weekly - 2 sets - 10 reps - 10 hold  ASSESSMENT:  CLINICAL IMPRESSION:  Re-evaluation completed today. Since starting therapy Azelyn verbalized feeling 20% better and no change in her pain levels. She verbalized that aquatic therapy is very beneficial for her and allows her more mobility without increasing her pain.  She is still very limited in her standing and walking  tolerance. She cannot stand or walking longer than 8 minutes before needing a seated rest break. Unloading the dishwasher and cleaning her room are also challenging. Malak has multiple co-morbidities that are affecting her functional mobility and progress. She has made improvements in her TUG time compared to eval. Suzanna would continue to benefit from skilled aquatic therapy to address current impairments and functional limitations. Re- certification for 1-2x week for 6 weeks.   OBJECTIVE IMPAIRMENTS: decreased activity tolerance, difficulty walking, decreased balance, decreased endurance, decreased mobility, decreased ROM, decreased strength, impaired flexibility, impaired UE/LE use, postural dysfunction, and pain.  ACTIVITY LIMITATIONS: bending, lifting, carry,  locomotion, cleaning, community activity, driving, and or occupation  PERSONAL FACTORS: Anxiety, Depression, DDD, Pre diabetes, PTSD, Sleep apnea, hx of breast cancer.  are also affecting patient's functional outcome.  REHAB POTENTIAL: Good  CLINICAL DECISION MAKING: Evolving/moderate complexity  EVALUATION COMPLEXITY: Moderate    GOALS: Short term PT Goals Target date: 08/15/2023 Pt will be I and compliant with HEP. Baseline:  Goal status:MET 09/18/2023 Pt will decrease pain by 25% overall Baseline:  Goal status: IN PROGRESS 09/18/2023- Patient reports no changes in pain levels  Long term PT goals Target date: 10/30/2023  Pt will improve ROM to [facility] with 4/10 pain to improve functional mobility Baseline: Goal status: IN PROGRESS 09/18/2023  Pt will improve  hip/knee strength to at least 5-/5 MMT to improve functional strength Baseline: Goal status:IN PROGRESS 09/18/2023  Pt will improve ODI to at least 15 points functional to show improved function Baseline: Goal status: IN PROGRESS 09/18/2023 - 25/50 50%  Pt will reduce pain by overall 50% overall with usual activity Baseline: Goal status: IN PROGRESS 09/18/2023- Patient reports no changes in pain levels  Pt will decrease TUG to below 14 seconds in order to demonstrate decreased fall risk Baseline: Goal status: IN PROGRESS 09/18/2023 - 15.66sec  Pt will decrease STS by  3.6 seconds in order to demonstrate decreased fall risk. - in progress, 08/30/23 Baseline: Goal status: IN PROGRESS 09/18/2023 - 18.76  Pt will be able to ambulate community distances at least 1000 ft with AD and gait pattern without complaints Baseline: Goal status: IN PROGRESS 09/18/2023 -  PLAN: PT FREQUENCY: 1-2 times per week   PT DURATION: 6 weeks  PLANNED INTERVENTIONS (unless contraindicated): aquatic PT, Canalith repositioning, cryotherapy, Electrical stimulation, Iontophoresis with 4 mg/ml dexamethasome, Moist heat, traction, Ultrasound, gait  training, Therapeutic exercise, balance training, neuromuscular re-education, patient/family education, prosthetic training, manual techniques, passive ROM, dry needling, taping, vasopnuematic device, vestibular, spinal manipulations, joint manipulations  PLAN FOR NEXT SESSION:  patient to continue aquatic therapy  Claude Manges, PT 09/18/23 2:00 PM

## 2023-09-20 ENCOUNTER — Encounter: Payer: Self-pay | Admitting: Physical Therapy

## 2023-09-20 ENCOUNTER — Ambulatory Visit: Payer: 59 | Admitting: Physical Therapy

## 2023-09-20 DIAGNOSIS — M5459 Other low back pain: Secondary | ICD-10-CM | POA: Diagnosis not present

## 2023-09-20 DIAGNOSIS — R293 Abnormal posture: Secondary | ICD-10-CM

## 2023-09-20 DIAGNOSIS — M6281 Muscle weakness (generalized): Secondary | ICD-10-CM

## 2023-09-20 DIAGNOSIS — Z483 Aftercare following surgery for neoplasm: Secondary | ICD-10-CM

## 2023-09-20 DIAGNOSIS — R262 Difficulty in walking, not elsewhere classified: Secondary | ICD-10-CM

## 2023-09-20 NOTE — Therapy (Signed)
OUTPATIENT PHYSICAL THERAPY THORACOLUMBAR TREATMENT NOTE / RE-CERTIFICATION   Patient Name: Kelli Estrada MRN: [id number] DOB:07-Nov-1960, 62 y.o., female Today's Date: 09/20/2023   END OF SESSION:  PT End of Session - 09/20/23 1553     Visit Number 12    Date for PT Re-Evaluation 10/30/23    Authorization Type [employer] DUAL COMPLETE    Progress Note Due on Visit 20    PT Start Time 1515    PT Stop Time 1556    PT Time Calculation (min) 41 min    Activity Tolerance Patient tolerated treatment well    Behavior During Therapy [facility] for tasks assessed/performed                     Past Medical History:  Diagnosis Date   Anxiety    Chronic pain    Depression    Family history of breast cancer 08/23/2022   H/O degenerative disc disease    History of radiation therapy    Left breast- 10/25/22-12/11/22- Dr. Antony Blackbird   History of radiation therapy    Left breast-10/25/22-12/11/22- Dr. Antony Blackbird   Pre-diabetes    PTSD (post-traumatic stress disorder)    Sleep apnea    Past Surgical History:  Procedure Laterality Date   ABDOMINAL HYSTERECTOMY     BREAST LUMPECTOMY WITH SENTINEL LYMPH NODE BIOPSY Left 09/14/2022   Procedure: LEFT BREAST CENTRAL LUMPECTOMY WITH SENTINEL LYMPH NODE BX;  Surgeon: Griselda Miner, MD;  Location: [facility];  Service: General;  Laterality: Left;   COLONOSCOPY WITH PROPOFOL N/A 03/15/2016   Procedure: COLONOSCOPY WITH PROPOFOL;  Surgeon: Dorena Cookey, MD;  Location: [facility];  Service: Endoscopy;  Laterality: N/A;   NECK SURGERY     Patient Active Problem List   Diagnosis Date Noted   Genetic testing 08/27/2022   Family history of breast cancer 08/23/2022   Malignant neoplasm of central portion of left breast in female, estrogen receptor positive (HCC) 08/20/2022   Chronic pain of left knee 06/03/2015   Depression, major, recurrent, moderate (HCC) 06/03/2015   Morbid obesity with body mass index of 50 or higher  (HCC) 06/03/2015   Vitamin D deficiency 06/03/2015   PTSD (post-traumatic stress disorder) 06/03/2015   Obstructive sleep apnea 06/03/2015    REFERRING PROVIDER: Courtney Paris, NP   REFERRING DIAG: Lumbago with sciatica, unspecified side [M54.40]   Rationale for Evaluation and Treatment: Rehabilitation  THERAPY DIAG:  Other low back pain  Muscle weakness (generalized)  Difficulty in walking, not elsewhere classified  Aftercare following surgery for neoplasm  Abnormal posture  ONSET DATE: Several years.   SUBJECTIVE:  SUBJECTIVE STATEMENT:RT knee really painful today  PERTINENT HISTORY:  Anxiety, Depression, DDD, Pre diabetes, PTSD, Sleep apnea, hx of breast cancer.   PAIN: 09/18/2023 Are you having pain? Yes: NPRS scale: 9/10 Pain location: Rt knee Pain description: Sharp pain Aggravating factors: Walking, doing activities around the house. Sitting for prolonged periods of time.  Relieving factors: Nothing at this time.   PRECAUTIONS: None  RED FLAGS: None   WEIGHT BEARING RESTRICTIONS: No  FALLS:  Has patient fallen in last 6 months? 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  10/24:  I want to be able to travel to [place] with my friend  OBJECTIVE:  Note: Objective measures were completed at Evaluation unless otherwise noted.  DIAGNOSTIC FINDINGS:  None in chart. [facility] per pt.   PATIENT SURVEYS:  ODI: 29/50 58%  09/18/2023: ODI 25/50 50%  SCREENING FOR RED  FLAGS: Bowel or bladder incontinence: Yes: incontinence   COGNITION: Overall cognitive status: Within functional limits for tasks assessed     SENSATION: WFL  POSTURE: rounded shoulders and decreased lumbar lordosis  PALPATION: No tenderness to   LUMBAR ROM:   Overall ROM is WFL with pt reporting pain with movement. Hip hinge with lumbar flexion.   LOWER EXTREMITY ROM:     Overall ROM is WFL with pt reporting pain with movement.   LOWER EXTREMITY MMT:    MMT Right eval Left eval  Hip flexion 4 4+  Knee flexion 4+ 4+  Knee extension 4+ 4+   (Blank rows = not tested)  FUNCTIONAL TESTS:  5 times sit to stand: 19.84 sec Timed up and go (TUG): 19.43 sec with SPC.   08/26/2023 : 286FT  08/30/23: 5x STS 18.17 sec  09/18/2023      5STS: 18.76 sec No UE support      TUG:15.66 sec with SPC  GAIT: Distance walked: 32ft Assistive device utilized: Single point cane Level of assistance: Complete Independence Comments: Pt walks with decreased stride and use of her AD.   TODAY'S TREATMENT:                                                                                                                              DATE:   09/20/23:Pt arrives for aquatic physical therapy. Treatment took place in 3.5-5.5 feet of water. Water temperature was 90 degrees F. Pt entered the pool via stairs, step to step  with heavy use of the rails. Pt requires buoyancy of water for support and to offload joints with strengthening exercises.  Seated water bench with 75% submersion Pt performed seated LE AROM exercises 20x in all planes, concurrent   pain assessment. 75% water walking  10x in each direction holding the single buoy hand wts encouraging some UE push/pull today. Bil hip kicks 20x in each direction with ankle fins for extra drag, pt requires holding onto pool wall for balance. Standing heel lifts 2x15, Seated hamstring stretch 2x 20 sec Bil. High knee march across pool holding large noodle  6x.. 5 min underwater bicycle in horseback. Decompression float 10 min for pain/stress management.    09/18/2023 NuStep Level 2 6 mins- PT present to discuss status & POC 5 STS: 18.76 No UE support TUG: 15.66 sec with SPC ODI:25/50 50% Seated marching 2# AW 2 x 10 bilateral  Seated LAQ 2# AW 2 x 10 bilateral  3 way SB stretch x 8 3 sec hold Discussion about mental health resources & mindfulness apps/ videos   09/11/23:Pt arrives for aquatic physical therapy. Treatment took place in 3.5-5.5 feet of water. Water temperature was 90 degrees F. Pt entered the pool via stairs, step to step  with heavy use of the rails. Pt requires buoyancy of water for support and to offload joints with strengthening exercises.  Seated water bench with 75% submersion Pt performed seated LE AROM exercises 20x in all planes, concurrent   pain assessment. 75% water walking 8x in each direction holding the single buoy hand wts encouraging some UE push/pull today. Bil hip kicks 20x in each direction, pt requires holding onto pool wall for balance. Standing heel lifts 2x15, Seated hamstring stretch 2x 20 sec Bil. High knee march across pool holding large noodle. Decompression float 10 min for pain/stress management with intermittent underwater LE bicycle.    09/04/23:Pt arrives for aquatic physical therapy. Treatment took place in 3.5-5.5 feet of water. Water temperature was 90 degrees F. Pt entered the pool via stairs, step to step  with heavy use of the rails. Pt requires buoyancy of water for support and to offload joints with strengthening exercises.  Seated water bench with 75% submersion Pt performed seated LE AROM exercises 20x in all planes, concurrent   pain assessment. 75% water walking 6x in each direction holding the single buoy hand wts. Bil hip kicks 15x in each direction, pt requires holding onto pool wall for balance. Standing heel lifts 2x10, Seated hamstring stretch 2x 20 sec Bil. High knee march across  pool holding large noodle.Underwater bicycle in horseback with hand floats for additional trunk stability with SBA 5 min. Decompression float 8 min at conclusion for pain.     PATIENT EDUCATION:  Education details: reacquainting to aquatic therapy Person educated: Patient Education method: Explanation Education comprehension: verbalized understanding  HOME EXERCISE PROGRAM: Access Code: [email or web address] URL: [email or web address] Date: 08/01/2023 Prepared by: Royal Hawthorn  Exercises - Supine Lower Trunk Rotation  - 2 x daily - 7 x weekly - 2 sets - 10 reps - Seated Hamstring Stretch  - 2 x daily - 7 x weekly - 2 sets - 2 reps - 30 hold - Supine Pelvic Tilt  - 1 x daily - 7 x weekly - 3 sets - 10 reps - Supine Transversus Abdominis Bracing - Hands on Stomach  - 1 x daily - 7 x weekly - 2 sets - 10 reps - 10 hold  ASSESSMENT:  CLINICAL IMPRESSION:   Pt presents slightly blue from dealing with pain all the time. She definitely perks up when exercising in the pool. She feels some pain in the pool today, or maybe is still aware of it, but it does not interfere or prohibit completing the task at hand.  OBJECTIVE IMPAIRMENTS: decreased activity tolerance, difficulty walking, decreased balance, decreased endurance, decreased mobility, decreased ROM, decreased strength, impaired flexibility, impaired UE/LE use, postural dysfunction, and pain.  ACTIVITY LIMITATIONS: bending, lifting, carry, locomotion, cleaning, community activity, driving, and or occupation  PERSONAL FACTORS: Anxiety, Depression, DDD, Pre diabetes, PTSD, Sleep apnea, hx of breast cancer.  are also affecting patient's functional outcome.  REHAB POTENTIAL: Good  CLINICAL DECISION MAKING: Evolving/moderate complexity  EVALUATION COMPLEXITY: Moderate    GOALS: Short term PT Goals Target date: 08/15/2023 Pt will be I and compliant with HEP. Baseline:  Goal status:MET 09/18/2023 Pt will decrease pain by 25%  overall Baseline:  Goal status: IN PROGRESS 09/18/2023- Patient reports no changes in pain levels  Long term PT goals Target date: 10/30/2023  Pt will improve ROM to [facility] with 4/10 pain to improve functional mobility Baseline: Goal status: IN PROGRESS 09/18/2023  Pt will improve  hip/knee strength to at least 5-/5 MMT to improve functional strength Baseline: Goal status:IN PROGRESS 09/18/2023  Pt will improve ODI to at least 15 points functional to show improved function Baseline: Goal status: IN PROGRESS 09/18/2023 - 25/50 50%  Pt will reduce pain by overall 50% overall with usual activity Baseline: Goal status: IN PROGRESS 09/18/2023- Patient reports no changes in pain levels  Pt will decrease TUG to below 14 seconds in order to demonstrate decreased fall risk Baseline: Goal status: IN PROGRESS 09/18/2023 - 15.66sec  Pt will decrease STS by  3.6 seconds in order to demonstrate decreased fall risk. - in progress, 08/30/23 Baseline: Goal status: IN PROGRESS 09/18/2023 - 18.76  Pt will be able to ambulate community distances at least 1000 ft with AD and gait pattern without complaints Baseline: Goal status: IN PROGRESS 09/18/2023 -  PLAN: PT FREQUENCY: 1-2 times per week   PT DURATION: 6 weeks  PLANNED INTERVENTIONS (unless contraindicated): aquatic PT, Canalith repositioning, cryotherapy, Electrical stimulation, Iontophoresis with 4 mg/ml dexamethasome, Moist heat, traction, Ultrasound, gait training, Therapeutic exercise, balance training, neuromuscular re-education, patient/family education, prosthetic training, manual techniques, passive ROM, dry needling, taping, vasopnuematic device, vestibular, spinal manipulations, joint manipulations  PLAN FOR NEXT SESSION:  patient to continue aquatic therapy  Ane Payment, PTA 09/20/23 3:54 PM

## 2023-09-24 NOTE — Therapy (Unsigned)
OUTPATIENT PHYSICAL THERAPY THORACOLUMBAR TREATMENT NOTE / RE-CERTIFICATION   Patient Name: Kelli Estrada MRN: [id number] DOB:1960/12/29, 62 y.o., female Today's Date: 09/24/2023   END OF SESSION:            Past Medical History:  Diagnosis Date   Anxiety    Chronic pain    Depression    Family history of breast cancer 08/23/2022   H/O degenerative disc disease    History of radiation therapy    Left breast- 10/25/22-12/11/22- Dr. Antony Blackbird   History of radiation therapy    Left breast-10/25/22-12/11/22- Dr. Antony Blackbird   Pre-diabetes    PTSD (post-traumatic stress disorder)    Sleep apnea    Past Surgical History:  Procedure Laterality Date   ABDOMINAL HYSTERECTOMY     BREAST LUMPECTOMY WITH SENTINEL LYMPH NODE BIOPSY Left 09/14/2022   Procedure: LEFT BREAST CENTRAL LUMPECTOMY WITH SENTINEL LYMPH NODE BX;  Surgeon: Griselda Miner, MD;  Location: [facility];  Service: General;  Laterality: Left;   COLONOSCOPY WITH PROPOFOL N/A 03/15/2016   Procedure: COLONOSCOPY WITH PROPOFOL;  Surgeon: Dorena Cookey, MD;  Location: [facility];  Service: Endoscopy;  Laterality: N/A;   NECK SURGERY     Patient Active Problem List   Diagnosis Date Noted   Genetic testing 08/27/2022   Family history of breast cancer 08/23/2022   Malignant neoplasm of central portion of left breast in female, estrogen receptor positive (HCC) 08/20/2022   Chronic pain of left knee 06/03/2015   Depression, major, recurrent, moderate (HCC) 06/03/2015   Morbid obesity with body mass index of 50 or higher (HCC) 06/03/2015   Vitamin D deficiency 06/03/2015   PTSD (post-traumatic stress disorder) 06/03/2015   Obstructive sleep apnea 06/03/2015    REFERRING PROVIDER: Courtney Paris, NP   REFERRING DIAG: Lumbago with sciatica, unspecified side [M54.40]   Rationale for Evaluation and Treatment: Rehabilitation  THERAPY DIAG:  Other low back pain  Muscle weakness  (generalized)  Difficulty in walking, not elsewhere classified  Aftercare following surgery for neoplasm  Abnormal posture  ONSET DATE: Several years.   SUBJECTIVE:                                                                                                                                                                                           SUBJECTIVE STATEMENT:RT knee really painful today  PERTINENT HISTORY:  Anxiety, Depression, DDD, Pre diabetes, PTSD, Sleep apnea, hx of breast cancer.   PAIN: 09/18/2023 Are you having pain? Yes: NPRS scale: 9/10 Pain location: Rt knee Pain description: Sharp pain Aggravating factors: Walking, doing activities around the house. Sitting for prolonged  periods of time.  Relieving factors: Nothing at this time.   PRECAUTIONS: None  RED FLAGS: None   WEIGHT BEARING RESTRICTIONS: No  FALLS:  Has patient fallen in last 6 months? 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  10/24:  I want to be able to travel to [place] with my friend  OBJECTIVE:  Note: Objective measures were completed at Evaluation unless otherwise noted.  DIAGNOSTIC FINDINGS:  None in chart. [facility] per pt.   PATIENT SURVEYS:  ODI: 29/50 58%  09/18/2023: ODI 25/50 50%  SCREENING FOR RED FLAGS: Bowel or bladder incontinence: Yes: incontinence   COGNITION: Overall cognitive status: Within functional limits for tasks assessed     SENSATION: WFL  POSTURE: rounded shoulders and decreased lumbar lordosis  PALPATION: No tenderness to   LUMBAR ROM:   Overall ROM is WFL with pt reporting pain with movement. Hip hinge with lumbar flexion.   LOWER  EXTREMITY ROM:     Overall ROM is WFL with pt reporting pain with movement.   LOWER EXTREMITY MMT:    MMT Right eval Left eval  Hip flexion 4 4+  Knee flexion 4+ 4+  Knee extension 4+ 4+   (Blank rows = not tested)  FUNCTIONAL TESTS:  5 times sit to stand: 19.84 sec Timed up and go (TUG): 19.43 sec with SPC.   08/26/2023 : 286FT  08/30/23: 5x STS 18.17 sec  09/18/2023      5STS: 18.76 sec No UE support      TUG:15.66 sec with SPC  GAIT: Distance walked: 34ft Assistive device utilized: Single point cane Level of assistance: Complete Independence Comments: Pt walks with decreased stride and use of her AD.   TODAY'S TREATMENT:                                                                                                                              DATE:   09/25/23:Pt arrives for aquatic physical therapy. Treatment took place in 3.5-5.5 feet of water. Water temperature was 90 degrees F. Pt entered the pool via stairs, step to step  with heavy use of the rails. Pt requires buoyancy of water for support and to offload joints with strengthening exercises.  Seated water bench with 75% submersion Pt performed seated LE AROM exercises 20x in all planes, concurrent   pain assessment. 75% water walking 10x in each direction holding the single buoy hand wts encouraging some UE push/pull today. Bil hip kicks 20x in each direction with ankle fins for extra drag, pt requires holding onto pool wall for balance. Standing heel lifts 2x15, Seated hamstring stretch 2x 20 sec Bil. High knee march across pool holding  large noodle 6x.. 5 min underwater bicycle in horseback. Decompression float 10 min for pain/stress management.    09/20/23:Pt arrives for aquatic physical therapy. Treatment took place in 3.5-5.5 feet of water. Water temperature was 90 degrees F. Pt entered the pool via stairs, step to step  with heavy use of the rails. Pt requires buoyancy of water for support and to offload  joints with strengthening exercises.  Seated water bench with 75% submersion Pt performed seated LE AROM exercises 20x in all planes, concurrent   pain assessment. 75% water walking 10x in each direction holding the single buoy hand wts encouraging some UE push/pull today. Bil hip kicks 20x in each direction with ankle fins for extra drag, pt requires holding onto pool wall for balance. Standing heel lifts 2x15, Seated hamstring stretch 2x 20 sec Bil. High knee march across pool holding large noodle 6x.. 5 min underwater bicycle in horseback. Decompression float 10 min for pain/stress management.    09/18/2023 NuStep Level 2 6 mins- PT present to discuss status & POC 5 STS: 18.76 No UE support TUG: 15.66 sec with SPC ODI:25/50 50% Seated marching 2# AW 2 x 10 bilateral  Seated LAQ 2# AW 2 x 10 bilateral  3 way SB stretch x 8 3 sec hold Discussion about mental health resources & mindfulness apps/ videos   min at conclusion for pain.     PATIENT EDUCATION:  Education details: reacquainting to aquatic therapy Person educated: Patient Education method: Explanation Education comprehension: verbalized understanding  HOME EXERCISE PROGRAM: Access Code: [email or web address] URL: [email or web address] Date: 08/01/2023 Prepared by: Royal Hawthorn  Exercises - Supine Lower Trunk Rotation  - 2 x daily - 7 x weekly - 2 sets - 10 reps - Seated Hamstring Stretch  - 2 x daily - 7 x weekly - 2 sets - 2 reps - 30 hold - Supine Pelvic Tilt  - 1 x daily - 7 x weekly - 3 sets - 10 reps - Supine Transversus Abdominis Bracing - Hands on Stomach  - 1 x daily - 7 x weekly - 2 sets - 10 reps - 10 hold  ASSESSMENT:  CLINICAL IMPRESSION:     OBJECTIVE IMPAIRMENTS: decreased activity tolerance, difficulty walking, decreased balance, decreased endurance, decreased mobility, decreased ROM, decreased strength, impaired flexibility, impaired UE/LE use, postural dysfunction, and pain.  ACTIVITY LIMITATIONS:  bending, lifting, carry, locomotion, cleaning, community activity, driving, and or occupation  PERSONAL FACTORS: Anxiety, Depression, DDD, Pre diabetes, PTSD, Sleep apnea, hx of breast cancer.  are also affecting patient's functional outcome.  REHAB POTENTIAL: Good  CLINICAL DECISION MAKING: Evolving/moderate complexity  EVALUATION COMPLEXITY: Moderate    GOALS: Short term PT Goals Target date: 08/15/2023 Pt will be I and compliant with HEP. Baseline:  Goal status:MET 09/18/2023 Pt will decrease pain by 25% overall Baseline:  Goal status: IN PROGRESS 09/18/2023- Patient reports no changes in pain levels  Long term PT goals Target date: 10/30/2023  Pt will improve ROM to [facility] with 4/10 pain to improve functional mobility Baseline: Goal status: IN PROGRESS 09/18/2023  Pt will improve  hip/knee strength to at least 5-/5 MMT to improve functional strength Baseline: Goal status:IN PROGRESS 09/18/2023  Pt will improve ODI to at least 15 points functional to show improved function Baseline: Goal status: IN PROGRESS 09/18/2023 - 25/50 50%  Pt will reduce pain by overall 50% overall with usual activity Baseline: Goal status: IN PROGRESS 09/18/2023- Patient reports no changes in pain levels  Pt will decrease TUG to below  14 seconds in order to demonstrate decreased fall risk Baseline: Goal status: IN PROGRESS 09/18/2023 - 15.66sec  Pt will decrease STS by  3.6 seconds in order to demonstrate decreased fall risk. - in progress, 08/30/23 Baseline: Goal status: IN PROGRESS 09/18/2023 - 18.76  Pt will be able to ambulate community distances at least 1000 ft with AD and gait pattern without complaints Baseline: Goal status: IN PROGRESS 09/18/2023 -  PLAN: PT FREQUENCY: 1-2 times per week   PT DURATION: 6 weeks  PLANNED INTERVENTIONS (unless contraindicated): aquatic PT, Canalith repositioning, cryotherapy, Electrical stimulation, Iontophoresis with 4 mg/ml dexamethasome, Moist heat,  traction, Ultrasound, gait training, Therapeutic exercise, balance training, neuromuscular re-education, patient/family education, prosthetic training, manual techniques, passive ROM, dry needling, taping, vasopnuematic device, vestibular, spinal manipulations, joint manipulations  PLAN FOR NEXT SESSION:  patient to continue aquatic therapy  Ane Payment, PTA 09/24/23 9:45 PM

## 2023-09-25 ENCOUNTER — Ambulatory Visit: Payer: 59 | Admitting: Physical Therapy

## 2023-09-25 DIAGNOSIS — M6281 Muscle weakness (generalized): Secondary | ICD-10-CM

## 2023-09-25 DIAGNOSIS — M5459 Other low back pain: Secondary | ICD-10-CM

## 2023-09-25 DIAGNOSIS — R262 Difficulty in walking, not elsewhere classified: Secondary | ICD-10-CM

## 2023-09-25 DIAGNOSIS — Z483 Aftercare following surgery for neoplasm: Secondary | ICD-10-CM

## 2023-09-25 DIAGNOSIS — R293 Abnormal posture: Secondary | ICD-10-CM

## 2023-09-26 ENCOUNTER — Encounter: Payer: Self-pay | Admitting: Physical Therapy

## 2023-09-27 ENCOUNTER — Encounter: Payer: Self-pay | Admitting: Physical Therapy

## 2023-09-27 ENCOUNTER — Ambulatory Visit: Payer: 59 | Admitting: Physical Therapy

## 2023-09-27 DIAGNOSIS — R262 Difficulty in walking, not elsewhere classified: Secondary | ICD-10-CM

## 2023-09-27 DIAGNOSIS — M5459 Other low back pain: Secondary | ICD-10-CM

## 2023-09-27 DIAGNOSIS — R293 Abnormal posture: Secondary | ICD-10-CM

## 2023-09-27 DIAGNOSIS — M6281 Muscle weakness (generalized): Secondary | ICD-10-CM

## 2023-09-27 NOTE — Therapy (Signed)
OUTPATIENT PHYSICAL THERAPY THORACOLUMBAR TREATMENT NOTE / RE-CERTIFICATION   Patient Name: Kelli Estrada MRN: [id number] DOB:Oct 24, 1960, 62 y.o., female Today's Date: 09/27/2023   END OF SESSION:  PT End of Session - 09/27/23 1432     Visit Number 13    Number of Visits 12    Date for PT Re-Evaluation 10/30/23    Authorization Type [employer] DUAL [employer]PLETE    Progress Note Due on Visit 20    PT Start Time 1431    PT Stop Time 1509    PT Time Calculation (min) 38 min    Activity Tolerance Patient tolerated treatment well;Patient limited by pain    Behavior During Therapy [facility] for tasks assessed/performed                      Past Medical History:  Diagnosis Date   Anxiety    Chronic pain    Depression    Family history of breast cancer 08/23/2022   H/O degenerative disc disease    History of radiation therapy    Left breast- 10/25/22-12/11/22- Dr. Antony Blackbird   History of radiation therapy    Left breast-10/25/22-12/11/22- Dr. Antony Blackbird   Pre-diabetes    PTSD (post-traumatic stress disorder)    Sleep apnea    Past Surgical History:  Procedure Laterality Date   ABDOMINAL HYSTERECTOMY     BREAST LUMPECTOMY WITH SENTINEL LYMPH NODE BIOPSY Left 09/14/2022   Procedure: LEFT BREAST CENTRAL LUMPECTOMY WITH SENTINEL LYMPH NODE BX;  Surgeon: Griselda Miner, MD;  Location: [facility];  Service: General;  Laterality: Left;   COLONOSCOPY WITH PROPOFOL N/A 03/15/2016   Procedure: COLONOSCOPY WITH PROPOFOL;  Surgeon: Dorena Cookey, MD;  Location: [facility];  Service: Endoscopy;  Laterality: N/A;   NECK SURGERY     Patient Active Problem List   Diagnosis Date Noted   Genetic testing 08/27/2022   Family history of breast cancer 08/23/2022   Malignant neoplasm of central portion of left breast in female, estrogen receptor positive (HCC) 08/20/2022   Chronic pain of left knee 06/03/2015   Depression, major, recurrent, moderate (HCC) 06/03/2015    Morbid obesity with body mass index of 50 or higher (HCC) 06/03/2015   Vitamin D deficiency 06/03/2015   PTSD (post-traumatic stress disorder) 06/03/2015   Obstructive sleep apnea 06/03/2015    REFERRING PROVIDER: Courtney Paris, NP   REFERRING DIAG: Lumbago with sciatica, unspecified side [M54.40]   Rationale for Evaluation and Treatment: Rehabilitation  THERAPY DIAG:  Other low back pain  Muscle weakness (generalized)  Difficulty in walking, not elsewhere classified  Abnormal posture  ONSET DATE: Several years.   SUBJECTIVE:  SUBJECTIVE STATEMENT:Sorry I missed Wednesday, I woke up with stomach bug.I feel better today.  PERTINENT HISTORY:  Anxiety, Depression, DDD, Pre diabetes, PTSD, Sleep apnea, hx of breast cancer.   PAIN: 09/18/2023 Are you having pain? Yes: NPRS scale: 7-8/10 Pain location: neck, back Pain description: Sharp pain Aggravating factors: Walking, doing activities around the house. Sitting for prolonged periods of time.  Relieving factors: Nothing at this time.   PRECAUTIONS: None  RED FLAGS: None   WEIGHT BEARING RESTRICTIONS: No  FALLS:  Has patient fallen in last 6 months? 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  10/24:  I want to be able to travel to [place] with my friend  OBJECTIVE:  Note: Objective measures were completed at Evaluation unless otherwise noted.  DIAGNOSTIC FINDINGS:  None in chart. [facility] per pt.   PATIENT SURVEYS:  ODI: 29/50 58%   09/18/2023: ODI 25/50 50%  SCREENING FOR RED FLAGS: Bowel or bladder incontinence: Yes: incontinence   COGNITION: Overall cognitive status: Within functional limits for tasks assessed     SENSATION: WFL  POSTURE: rounded shoulders and decreased lumbar lordosis  PALPATION: No tenderness to   LUMBAR ROM:   Overall ROM is WFL with pt reporting pain with movement. Hip hinge with lumbar flexion.   LOWER EXTREMITY ROM:     Overall ROM is WFL with pt reporting pain with movement.   LOWER EXTREMITY MMT:    MMT Right eval Left eval  Hip flexion 4 4+  Knee flexion 4+ 4+  Knee extension 4+ 4+   (Blank rows = not tested)  FUNCTIONAL TESTS:  5 times sit to stand: 19.84 sec Timed up and go (TUG): 19.43 sec with SPC.   08/26/2023 : 286FT  08/30/23: 5x STS 18.17 sec  09/18/2023      5STS: 18.76 sec No UE support      TUG:15.66 sec with SPC  GAIT: Distance walked: 47ft Assistive device utilized: Single point cane Level of assistance: Complete Independence Comments: Pt walks with decreased stride and use of her AD.   TODAY'S TREATMENT:                                                                                                                              DATE:   09/27/23:Pt arrives for aquatic physical therapy. Treatment took place in 3.5-5.5 feet of water. Water temperature was 91 degrees F. Pt entered the pool via stairs, step to step  with heavy use of the rails. Pt requires buoyancy of water for support and to offload joints with strengthening exercises.  Seated water bench with 75% submersion Pt performed seated LE AROM exercises 20x in all planes,  concurrent   pain assessment. 75% water walking 10x in each direction holding the single buoy hand wts encouraging some UE push/pull today and picking up her pace. Bil hip kicks 20x in each direction with ankle fins for extra drag, pt requires holding onto pool wall for balance. Standing heel lifts 2x15, Seated hamstring  stretch 2x 20 sec Bil. High knee march across pool holding large noodle 6x.. 6 min underwater bicycle in horseback. Decompression float 10 min for pain/stress management emphasis on low back stretching in 5 ft end.    09/25/23:Pt arrives for aquatic physical therapy. Treatment took place in 3.5-5.5 feet of water. Water temperature was 90 degrees F. Pt entered the pool via stairs, step to step  with heavy use of the rails. Pt requires buoyancy of water for support and to offload joints with strengthening exercises.  Seated water bench with 75% submersion Pt performed seated LE AROM exercises 20x in all planes, concurrent   pain assessment. 75% water walking 10x in each direction holding the single buoy hand wts encouraging some UE push/pull today. Bil hip kicks 20x in each direction with ankle fins for extra drag, pt requires holding onto pool wall for balance. Standing heel lifts 2x15, Seated hamstring stretch 2x 20 sec Bil. High knee march across pool holding large noodle 6x.. 5 min underwater bicycle in horseback. Decompression float 10 min for pain/stress management.    09/20/23:Pt arrives for aquatic physical therapy. Treatment took place in 3.5-5.5 feet of water. Water temperature was 90 degrees F. Pt entered the pool via stairs, step to step  with heavy use of the rails. Pt requires buoyancy of water for support and to offload joints with strengthening exercises.  Seated water bench with 75% submersion Pt performed seated LE AROM exercises 20x in all planes, concurrent   pain assessment. 75% water walking 10x in each direction holding the single buoy hand wts encouraging some UE push/pull today. Bil hip kicks 20x in each direction with ankle fins for extra drag, pt requires holding onto pool wall for balance. Standing heel lifts 2x15, Seated hamstring stretch 2x 20 sec Bil. High knee march across pool holding large noodle 6x.. 5 min underwater bicycle in horseback. Decompression float 10 min for  pain/stress management.    PATIENT EDUCATION:  Education details: reacquainting to aquatic therapy Person educated: Patient Education method: Explanation Education comprehension: verbalized understanding  HOME EXERCISE PROGRAM: Access Code: [email or web address] URL: [email or web address] Date: 08/01/2023 Prepared by: Royal Hawthorn  Exercises - Supine Lower Trunk Rotation  - 2 x daily - 7 x weekly - 2 sets - 10 reps - Seated Hamstring Stretch  - 2 x daily - 7 x weekly - 2 sets - 2 reps - 30 hold - Supine Pelvic Tilt  - 1 x daily - 7 x weekly - 3 sets - 10 reps - Supine Transversus Abdominis Bracing - Hands on Stomach  - 1 x daily - 7 x weekly - 2 sets - 10 reps - 10 hold  ASSESSMENT:  CLINICAL IMPRESSION:   Added some small additions to her program today, a min longer bicycle and increased speed walking her laps 5-10. Despite pain levels pt consistently does well exercising in the water.  OBJECTIVE IMPAIRMENTS: decreased activity tolerance, difficulty walking, decreased balance, decreased endurance, decreased mobility, decreased ROM, decreased strength, impaired flexibility, impaired UE/LE use, postural dysfunction, and pain.  ACTIVITY LIMITATIONS: bending, lifting, carry, locomotion, cleaning, community activity, driving, and or occupation  PERSONAL FACTORS: Anxiety, Depression, DDD, Pre diabetes,  PTSD, Sleep apnea, hx of breast cancer.  are also affecting patient's functional outcome.  REHAB POTENTIAL: Good  CLINICAL DECISION MAKING: Evolving/moderate complexity  EVALUATION COMPLEXITY: Moderate    GOALS: Short term PT Goals Target date: 08/15/2023 Pt will be I and compliant with HEP. Baseline:  Goal status:MET 09/18/2023 Pt will decrease pain by 25% overall Baseline:  Goal status: IN PROGRESS 09/18/2023- Patient reports no changes in pain levels  Long term PT goals Target date: 10/30/2023  Pt will improve ROM to [facility] with 4/10 pain to improve functional  mobility Baseline: Goal status: IN PROGRESS 09/18/2023  Pt will improve  hip/knee strength to at least 5-/5 MMT to improve functional strength Baseline: Goal status:IN PROGRESS 09/18/2023  Pt will improve ODI to at least 15 points functional to show improved function Baseline: Goal status: IN PROGRESS 09/18/2023 - 25/50 50%  Pt will reduce pain by overall 50% overall with usual activity Baseline: Goal status: IN PROGRESS 09/18/2023- Patient reports no changes in pain levels  Pt will decrease TUG to below 14 seconds in order to demonstrate decreased fall risk Baseline: Goal status: IN PROGRESS 09/18/2023 - 15.66sec  Pt will decrease STS by  3.6 seconds in order to demonstrate decreased fall risk. - in progress, 08/30/23 Baseline: Goal status: IN PROGRESS 09/18/2023 - 18.76  Pt will be able to ambulate community distances at least 1000 ft with AD and gait pattern without complaints Baseline: Goal status: IN PROGRESS 09/18/2023 -  PLAN: PT FREQUENCY: 1-2 times per week   PT DURATION: 6 weeks  PLANNED INTERVENTIONS (unless contraindicated): aquatic PT, Canalith repositioning, cryotherapy, Electrical stimulation, Iontophoresis with 4 mg/ml dexamethasome, Moist heat, traction, Ultrasound, gait training, Therapeutic exercise, balance training, neuromuscular re-education, patient/family education, prosthetic training, manual techniques, passive ROM, dry needling, taping, vasopnuematic device, vestibular, spinal manipulations, joint manipulations  PLAN FOR NEXT SESSION:  patient to continue aquatic therapy  Ane Payment, PTA 09/27/23 3:41 PM

## 2023-10-01 NOTE — Therapy (Unsigned)
OUTPATIENT PHYSICAL THERAPY THORACOLUMBAR TREATMENT NOTE    Patient Name: Kelli Estrada MRN: [id number] DOB:[birth year], 62 y.o., female Today's Date: 10/02/2023   END OF SESSION:  PT End of Session - 10/02/23 1139     Visit Number 14    Date for PT Re-Evaluation 10/30/23    Authorization Type [employer] DUAL COMPLETE    Progress Note Due on Visit 20    PT Start Time 1100    PT Stop Time 1140    PT Time Calculation (min) 40 min    Activity Tolerance Patient tolerated treatment well    Behavior During Therapy [facility] for tasks assessed/performed                       Past Medical History:  Diagnosis Date   Anxiety    Chronic pain    Depression    Family history of breast cancer 08/23/2022   H/O degenerative disc disease    History of radiation therapy    Left breast- 10/25/22-12/11/22- Dr. Antony Blackbird   History of radiation therapy    Left breast-10/25/22-12/11/22- Dr. Antony Blackbird   Pre-diabetes    PTSD (post-traumatic stress disorder)    Sleep apnea    Past Surgical History:  Procedure Laterality Date   ABDOMINAL HYSTERECTOMY     BREAST LUMPECTOMY WITH SENTINEL LYMPH NODE BIOPSY Left 09/14/2022   Procedure: LEFT BREAST CENTRAL LUMPECTOMY WITH SENTINEL LYMPH NODE BX;  Surgeon: Griselda Miner, MD;  Location: [facility];  Service: General;  Laterality: Left;   COLONOSCOPY WITH PROPOFOL N/A 03/15/2016   Procedure: COLONOSCOPY WITH PROPOFOL;  Surgeon: Dorena Cookey, MD;  Location: [facility];  Service: Endoscopy;  Laterality: N/A;   NECK SURGERY     Patient Active Problem List   Diagnosis Date Noted   Genetic testing 08/27/2022   Family history of breast cancer 08/23/2022   Malignant neoplasm of central portion of left breast in female, estrogen receptor positive (HCC) 08/20/2022   Chronic pain of left knee 06/03/2015   Depression, major, recurrent, moderate (HCC) 06/03/2015   Morbid obesity with body mass index of 50 or higher (HCC)  06/03/2015   Vitamin D deficiency 06/03/2015   PTSD (post-traumatic stress disorder) 06/03/2015   Obstructive sleep apnea 06/03/2015    REFERRING PROVIDER: Courtney Paris, NP   REFERRING DIAG: Lumbago with sciatica, unspecified side [M54.40]   Rationale for Evaluation and Treatment: Rehabilitation  THERAPY DIAG:  Other low back pain  Muscle weakness (generalized)  Difficulty in walking, not elsewhere classified  Abnormal posture  ONSET DATE: Several years.   SUBJECTIVE:  SUBJECTIVE STATEMENT: Pain is unchanged but I feel less stress when I can be in the pool. I am thinkning of going to the [employer].  PERTINENT HISTORY:  Anxiety, Depression, DDD, Pre diabetes, PTSD, Sleep apnea, hx of breast cancer.   PAIN: 09/18/2023 Are you having pain? Yes: NPRS scale: 7-8/10 Pain location: neck, back Pain description: Sharp pain Aggravating factors: Walking, doing activities around the house. Sitting for prolonged periods of time.  Relieving factors: Nothing at this time.   PRECAUTIONS: None  RED FLAGS: None   WEIGHT BEARING RESTRICTIONS: No  FALLS:  Has patient fallen in last 6 months? 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  10/24:  I want to be able to travel to [place] with my friend  OBJECTIVE:  Note: Objective measures were completed at Evaluation unless otherwise noted.  DIAGNOSTIC FINDINGS:  None in chart. [facility] per pt.   PATIENT SURVEYS:  ODI: 29/50 58%  09/18/2023: ODI 25/50  50%  SCREENING FOR RED FLAGS: Bowel or bladder incontinence: Yes: incontinence   COGNITION: Overall cognitive status: Within functional limits for tasks assessed     SENSATION: WFL  POSTURE: rounded shoulders and decreased lumbar lordosis  PALPATION: No tenderness to   LUMBAR ROM:   Overall ROM is WFL with pt reporting pain with movement. Hip hinge with lumbar flexion.   LOWER EXTREMITY ROM:     Overall ROM is WFL with pt reporting pain with movement.   LOWER EXTREMITY MMT:    MMT Right eval Left eval  Hip flexion 4 4+  Knee flexion 4+ 4+  Knee extension 4+ 4+   (Blank rows = not tested)  FUNCTIONAL TESTS:  5 times sit to stand: 19.84 sec Timed up and go (TUG): 19.43 sec with SPC.   08/26/2023 : 286FT  08/30/23: 5x STS 18.17 sec  09/18/2023      5STS: 18.76 sec No UE support      TUG:15.66 sec with SPC  GAIT: Distance walked: 69ft Assistive device utilized: Single point cane Level of assistance: Complete Independence Comments: Pt walks with decreased stride and use of her AD.   TODAY'S TREATMENT:                                                                                                                              DATE:   10/02/23:Pt arrives for aquatic physical therapy. Treatment took place in 3.5-5.5 feet of water. Water temperature was 91 degrees F. Pt entered the pool via stairs, step to step  with heavy use of the rails. Pt requires buoyancy of water for support and to offload joints with strengthening exercises.  Seated water bench with 75%  submersion Pt performed seated LE AROM exercises 20x in all planes, concurrent   pain assessment. 75% water walking 10x in each direction holding the single buoy hand wts encouraging some UE push/pull today and picking up her pace. Bil hip kicks 20x in each direction with ankle fins for extra drag, pt requires holding onto pool wall for balance. Standing heel lifts 2x15, Seated hamstring stretch 2x 20 sec Bil.  High knee march across pool holding large noodle 6x.. 8 min underwater bicycle in horseback. Decompression float 10 min for pain/stress management emphasis on low back stretching in 5 ft end.    09/27/23:Pt arrives for aquatic physical therapy. Treatment took place in 3.5-5.5 feet of water. Water temperature was 91 degrees F. Pt entered the pool via stairs, step to step  with heavy use of the rails. Pt requires buoyancy of water for support and to offload joints with strengthening exercises.  Seated water bench with 75% submersion Pt performed seated LE AROM exercises 20x in all planes, concurrent   pain assessment. 75% water walking 10x in each direction holding the single buoy hand wts encouraging some UE push/pull today and picking up her pace. Bil hip kicks 20x in each direction with ankle fins for extra drag, pt requires holding onto pool wall for balance. Standing heel lifts 2x15, Seated hamstring stretch 2x 20 sec Bil. High knee march across pool holding large noodle 6x.. 6 min underwater bicycle in horseback. Decompression float 10 min for pain/stress management emphasis on low back stretching in 5 ft end.    09/25/23:Pt arrives for aquatic physical therapy. Treatment took place in 3.5-5.5 feet of water. Water temperature was 90 degrees F. Pt entered the pool via stairs, step to step  with heavy use of the rails. Pt requires buoyancy of water for support and to offload joints with strengthening exercises.  Seated water bench with 75% submersion Pt performed seated LE AROM exercises 20x in all planes, concurrent   pain assessment. 75% water walking 10x in each direction holding the single buoy hand wts encouraging some UE push/pull today. Bil hip kicks 20x in each direction with ankle fins for extra drag, pt requires holding onto pool wall for balance. Standing heel lifts 2x15, Seated hamstring stretch 2x 20 sec Bil. High knee march across pool holding large noodle 6x.. 5 min underwater bicycle in  horseback. Decompression float 10 min for pain/stress management.    PATIENT EDUCATION:  Education details: reacquainting to aquatic therapy Person educated: Patient Education method: Explanation Education comprehension: verbalized understanding  HOME EXERCISE PROGRAM: Access Code: [email or web address] URL: [email or web address] Date: 08/01/2023 Prepared by: Royal Hawthorn  Exercises - Supine Lower Trunk Rotation  - 2 x daily - 7 x weekly - 2 sets - 10 reps - Seated Hamstring Stretch  - 2 x daily - 7 x weekly - 2 sets - 2 reps - 30 hold - Supine Pelvic Tilt  - 1 x daily - 7 x weekly - 3 sets - 10 reps - Supine Transversus Abdominis Bracing - Hands on Stomach  - 1 x daily - 7 x weekly - 2 sets - 10 reps - 10 hold  ASSESSMENT:  CLINICAL IMPRESSION:   Pain is essentially unchanged overall. Pt does report benefit of stress relief and not hurting while being in the water. She reports today she is thinkning of going to the [facility] if she can get  someone to go with her.  OBJECTIVE IMPAIRMENTS: decreased activity tolerance, difficulty walking, decreased balance, decreased endurance, decreased  mobility, decreased ROM, decreased strength, impaired flexibility, impaired UE/LE use, postural dysfunction, and pain.  ACTIVITY LIMITATIONS: bending, lifting, carry, locomotion, cleaning, community activity, driving, and or occupation  PERSONAL FACTORS: Anxiety, Depression, DDD, Pre diabetes, PTSD, Sleep apnea, hx of breast cancer.  are also affecting patient's functional outcome.  REHAB POTENTIAL: Good  CLINICAL DECISION MAKING: Evolving/moderate complexity  EVALUATION COMPLEXITY: Moderate    GOALS: Short term PT Goals Target date: 08/15/2023 Pt will be I and compliant with HEP. Baseline:  Goal status:MET 09/18/2023 Pt will decrease pain by 25% overall Baseline:  Goal status: IN PROGRESS 09/18/2023- Patient reports no changes in pain levels  Long term PT goals Target date: 10/30/2023  Pt  will improve ROM to [facility] with 4/10 pain to improve functional mobility Baseline: Goal status: IN PROGRESS 09/18/2023  Pt will improve  hip/knee strength to at least 5-/5 MMT to improve functional strength Baseline: Goal status:IN PROGRESS 09/18/2023  Pt will improve ODI to at least 15 points functional to show improved function Baseline: Goal status: IN PROGRESS 09/18/2023 - 25/50 50%  Pt will reduce pain by overall 50% overall with usual activity Baseline: Goal status: IN PROGRESS 09/18/2023- Patient reports no changes in pain levels  Pt will decrease TUG to below 14 seconds in order to demonstrate decreased fall risk Baseline: Goal status: IN PROGRESS 09/18/2023 - 15.66sec  Pt will decrease STS by  3.6 seconds in order to demonstrate decreased fall risk. - in progress, 08/30/23 Baseline: Goal status: IN PROGRESS 09/18/2023 - 18.76  Pt will be able to ambulate community distances at least 1000 ft with AD and gait pattern without complaints Baseline: Goal status: IN PROGRESS 09/18/2023 -  PLAN: PT FREQUENCY: 1-2 times per week   PT DURATION: 6 weeks  PLANNED INTERVENTIONS (unless contraindicated): aquatic PT, Canalith repositioning, cryotherapy, Electrical stimulation, Iontophoresis with 4 mg/ml dexamethasome, Moist heat, traction, Ultrasound, gait training, Therapeutic exercise, balance training, neuromuscular re-education, patient/family education, prosthetic training, manual techniques, passive ROM, dry needling, taping, vasopnuematic device, vestibular, spinal manipulations, joint manipulations  PLAN FOR NEXT SESSION:  patient to continue aquatic therapy  Ane Payment, PTA 10/02/23 11:40 AM

## 2023-10-02 ENCOUNTER — Other Ambulatory Visit: Payer: Self-pay

## 2023-10-02 ENCOUNTER — Encounter: Payer: Self-pay | Admitting: Physical Therapy

## 2023-10-02 ENCOUNTER — Ambulatory Visit: Payer: 59 | Admitting: Physical Therapy

## 2023-10-02 DIAGNOSIS — R293 Abnormal posture: Secondary | ICD-10-CM

## 2023-10-02 DIAGNOSIS — R262 Difficulty in walking, not elsewhere classified: Secondary | ICD-10-CM

## 2023-10-02 DIAGNOSIS — M5459 Other low back pain: Secondary | ICD-10-CM | POA: Diagnosis not present

## 2023-10-02 DIAGNOSIS — M6281 Muscle weakness (generalized): Secondary | ICD-10-CM

## 2023-10-03 NOTE — Therapy (Signed)
OUTPATIENT PHYSICAL THERAPY THORACOLUMBAR TREATMENT NOTE    Patient Name: Kelli Estrada MRN: [id number] DOB:03/08/1961, 62 y.o., female Today's Date: 10/04/2023   END OF SESSION:  PT End of Session - 10/04/23 1537     Visit Number 15    Date for PT Re-Evaluation 10/30/23    Authorization Type [employer] DUAL COMPLETE    Progress Note Due on Visit 20    PT Start Time 1300    PT Stop Time 1345    PT Time Calculation (min) 45 min    Activity Tolerance Patient tolerated treatment well    Behavior During Therapy [facility] for tasks assessed/performed                        Past Medical History:  Diagnosis Date   Anxiety    Chronic pain    Depression    Family history of breast cancer 08/23/2022   H/O degenerative disc disease    History of radiation therapy    Left breast- 10/25/22-12/11/22- Dr. Antony Blackbird   History of radiation therapy    Left breast-10/25/22-12/11/22- Dr. Antony Blackbird   Pre-diabetes    PTSD (post-traumatic stress disorder)    Sleep apnea    Past Surgical History:  Procedure Laterality Date   ABDOMINAL HYSTERECTOMY     BREAST LUMPECTOMY WITH SENTINEL LYMPH NODE BIOPSY Left 09/14/2022   Procedure: LEFT BREAST CENTRAL LUMPECTOMY WITH SENTINEL LYMPH NODE BX;  Surgeon: Griselda Miner, MD;  Location: [facility];  Service: General;  Laterality: Left;   COLONOSCOPY WITH PROPOFOL N/A 03/15/2016   Procedure: COLONOSCOPY WITH PROPOFOL;  Surgeon: Dorena Cookey, MD;  Location: [facility];  Service: Endoscopy;  Laterality: N/A;   NECK SURGERY     Patient Active Problem List   Diagnosis Date Noted   Genetic testing 08/27/2022   Family history of breast cancer 08/23/2022   Malignant neoplasm of central portion of left breast in female, estrogen receptor positive (HCC) 08/20/2022   Chronic pain of left knee 06/03/2015   Depression, major, recurrent, moderate (HCC) 06/03/2015   Morbid obesity with body mass index of 50 or higher (HCC)  06/03/2015   Vitamin D deficiency 06/03/2015   PTSD (post-traumatic stress disorder) 06/03/2015   Obstructive sleep apnea 06/03/2015    REFERRING PROVIDER: Courtney Paris, NP   REFERRING DIAG: Lumbago with sciatica, unspecified side [M54.40]   Rationale for Evaluation and Treatment: Rehabilitation  THERAPY DIAG:  Other low back pain  Muscle weakness (generalized)  Difficulty in walking, not elsewhere classified  Abnormal posture  ONSET DATE: Several years.   SUBJECTIVE:  SUBJECTIVE STATEMENT: Pt down in the dumps, hearing negative things on the TV and her knee pain bringing her down.  PERTINENT HISTORY:  Anxiety, Depression, DDD, Pre diabetes, PTSD, Sleep apnea, hx of breast cancer.   PAIN: 09/18/2023 Are you having pain? Yes, RT knee ache and throbs. Feels better in the pool.  PRECAUTIONS: None  RED FLAGS: None   WEIGHT BEARING RESTRICTIONS: No  FALLS:  Has patient fallen in last 6 months? 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  10/24:  I want to be able to travel to [place] with my friend  OBJECTIVE:  Note: Objective measures were completed at Evaluation unless otherwise noted.  DIAGNOSTIC FINDINGS:  None in chart. [facility] per pt.   PATIENT SURVEYS:  ODI: 29/50 58%  09/18/2023: ODI 25/50 50%  SCREENING FOR RED FLAGS: Bowel or bladder incontinence: Yes: incontinence   COGNITION: Overall cognitive status: Within functional limits for tasks  assessed     SENSATION: WFL  POSTURE: rounded shoulders and decreased lumbar lordosis  PALPATION: No tenderness to   LUMBAR ROM:   Overall ROM is WFL with pt reporting pain with movement. Hip hinge with lumbar flexion.   LOWER EXTREMITY ROM:     Overall ROM is WFL with pt reporting pain with movement.   LOWER EXTREMITY MMT:    MMT Right eval Left eval  Hip flexion 4 4+  Knee flexion 4+ 4+  Knee extension 4+ 4+   (Blank rows = not tested)  FUNCTIONAL TESTS:  5 times sit to stand: 19.84 sec Timed up and go (TUG): 19.43 sec with SPC.   08/26/2023 : 286FT  08/30/23: 5x STS 18.17 sec  09/18/2023      5STS: 18.76 sec No UE support      TUG:15.66 sec with SPC  GAIT: Distance walked: 17ft Assistive device utilized: Single point cane Level of assistance: Complete Independence Comments: Pt walks with decreased stride and use of her AD.   TODAY'S TREATMENT:                                                                                                                              DATE:   10/04/23:Pt arrives for aquatic physical therapy. Treatment took place in 3.5-5.5 feet of water. Water temperature was 91 degrees F. Pt entered the pool via stairs, step to step  with heavy use of the rails. Pt requires buoyancy of water for support and to offload joints with strengthening exercises.  Seated water bench with 75% submersion Pt performed seated LE AROM exercises 20x in all planes, concurrent   pain assessment. 75% water walking 10x in each direction holding the single buoy hand  wts encouraging some UE push/pull today and picking up her pace. Bil hip kicks 20x in each direction with ankle fins for extra drag, pt requires holding onto pool wall for balance. Standing heel lifts 2x15, Seated hamstring stretch 2x 20 sec Bil. High knee march across pool holding large noodle 6x. Single knee extension pushing blue noodle down 10x Bil.Marland Kitchen 8 min underwater bicycle in horseback.  Decompression float 10 min for pain/stress management emphasis on low back stretching in 5 ft end.    10/02/23:Pt arrives for aquatic physical therapy. Treatment took place in 3.5-5.5 feet of water. Water temperature was 91 degrees F. Pt entered the pool via stairs, step to step  with heavy use of the rails. Pt requires buoyancy of water for support and to offload joints with strengthening exercises.  Seated water bench with 75% submersion Pt performed seated LE AROM exercises 20x in all planes, concurrent   pain assessment. 75% water walking 10x in each direction holding the single buoy hand wts encouraging some UE push/pull today and picking up her pace. Bil hip kicks 20x in each direction with ankle fins for extra drag, pt requires holding onto pool wall for balance. Standing heel lifts 2x15, Seated hamstring stretch 2x 20 sec Bil. High knee march across pool holding large noodle 6x.. 8 min underwater bicycle in horseback. Decompression float 10 min for pain/stress management emphasis on low back stretching in 5 ft end.    09/27/23:Pt arrives for aquatic physical therapy. Treatment took place in 3.5-5.5 feet of water. Water temperature was 91 degrees F. Pt entered the pool via stairs, step to step  with heavy use of the rails. Pt requires buoyancy of water for support and to offload joints with strengthening exercises.  Seated water bench with 75% submersion Pt performed seated LE AROM exercises 20x in all planes, concurrent   pain assessment. 75% water walking 10x in each direction holding the single buoy hand wts encouraging some UE push/pull today and picking up her pace. Bil hip kicks 20x in each direction with ankle fins for extra drag, pt requires holding onto pool wall for balance. Standing heel lifts 2x15, Seated hamstring stretch 2x 20 sec Bil. High knee march across pool holding large noodle 6x.. 6 min underwater bicycle in horseback. Decompression float 10 min for pain/stress management  emphasis on low back stretching in 5 ft end.     PATIENT EDUCATION:  Education details: reacquainting to aquatic therapy Person educated: Patient Education method: Explanation Education comprehension: verbalized understanding  HOME EXERCISE PROGRAM: Access Code: [email or web address] URL: [email or web address] Date: 08/01/2023 Prepared by: Royal Hawthorn  Exercises - Supine Lower Trunk Rotation  - 2 x daily - 7 x weekly - 2 sets - 10 reps - Seated Hamstring Stretch  - 2 x daily - 7 x weekly - 2 sets - 2 reps - 30 hold - Supine Pelvic Tilt  - 1 x daily - 7 x weekly - 3 sets - 10 reps - Supine Transversus Abdominis Bracing - Hands on Stomach  - 1 x daily - 7 x weekly - 2 sets - 10 reps - 10 hold  ASSESSMENT:  CLINICAL IMPRESSION:  Pt was encouraged to only positive, uplifting things to listen to to keep her mood positive. No reports of pain while exercising in the pool. Increased a few areas today to work her muscles a little more.  OBJECTIVE IMPAIRMENTS: decreased activity tolerance, difficulty walking, decreased balance, decreased endurance, decreased mobility, decreased ROM, decreased strength, impaired flexibility, impaired UE/LE  use, postural dysfunction, and pain.  ACTIVITY LIMITATIONS: bending, lifting, carry, locomotion, cleaning, community activity, driving, and or occupation  PERSONAL FACTORS: Anxiety, Depression, DDD, Pre diabetes, PTSD, Sleep apnea, hx of breast cancer.  are also affecting patient's functional outcome.  REHAB POTENTIAL: Good  CLINICAL DECISION MAKING: Evolving/moderate complexity  EVALUATION COMPLEXITY: Moderate    GOALS: Short term PT Goals Target date: 08/15/2023 Pt will be I and compliant with HEP. Baseline:  Goal status:MET 09/18/2023 Pt will decrease pain by 25% overall Baseline:  Goal status: IN PROGRESS 09/18/2023- Patient reports no changes in pain levels  Long term PT goals Target date: 10/30/2023  Pt will improve ROM to [facility] with 4/10  pain to improve functional mobility Baseline: Goal status: IN PROGRESS 09/18/2023  Pt will improve  hip/knee strength to at least 5-/5 MMT to improve functional strength Baseline: Goal status:IN PROGRESS 09/18/2023  Pt will improve ODI to at least 15 points functional to show improved function Baseline: Goal status: IN PROGRESS 09/18/2023 - 25/50 50%  Pt will reduce pain by overall 50% overall with usual activity Baseline: Goal status: IN PROGRESS 09/18/2023- Patient reports no changes in pain levels  Pt will decrease TUG to below 14 seconds in order to demonstrate decreased fall risk Baseline: Goal status: IN PROGRESS 09/18/2023 - 15.66sec  Pt will decrease STS by  3.6 seconds in order to demonstrate decreased fall risk. - in progress, 08/30/23 Baseline: Goal status: IN PROGRESS 09/18/2023 - 18.76  Pt will be able to ambulate community distances at least 1000 ft with AD and gait pattern without complaints Baseline: Goal status: IN PROGRESS 09/18/2023 -  PLAN: PT FREQUENCY: 1-2 times per week   PT DURATION: 6 weeks  PLANNED INTERVENTIONS (unless contraindicated): aquatic PT, Canalith repositioning, cryotherapy, Electrical stimulation, Iontophoresis with 4 mg/ml dexamethasome, Moist heat, traction, Ultrasound, gait training, Therapeutic exercise, balance training, neuromuscular re-education, patient/family education, prosthetic training, manual techniques, passive ROM, dry needling, taping, vasopnuematic device, vestibular, spinal manipulations, joint manipulations  PLAN FOR NEXT SESSION:  patient to continue aquatic therapy  Ane Payment, PTA 10/04/23 3:38 PM

## 2023-10-04 ENCOUNTER — Ambulatory Visit: Payer: 59 | Admitting: Physical Therapy

## 2023-10-04 ENCOUNTER — Encounter: Payer: Self-pay | Admitting: Physical Therapy

## 2023-10-04 DIAGNOSIS — M6281 Muscle weakness (generalized): Secondary | ICD-10-CM

## 2023-10-04 DIAGNOSIS — M5459 Other low back pain: Secondary | ICD-10-CM

## 2023-10-04 DIAGNOSIS — R262 Difficulty in walking, not elsewhere classified: Secondary | ICD-10-CM

## 2023-10-04 DIAGNOSIS — R293 Abnormal posture: Secondary | ICD-10-CM

## 2023-10-07 ENCOUNTER — Other Ambulatory Visit: Payer: Self-pay

## 2023-10-07 ENCOUNTER — Other Ambulatory Visit: Payer: Self-pay | Admitting: Hematology and Oncology

## 2023-10-07 NOTE — Progress Notes (Signed)
Specialty Pharmacy Refill Coordination Note  Kelli Estrada is a 62 y.o. female contacted today regarding refills of specialty medication(s) Abemaciclib Kelli Estrada)   Patient requested Delivery   Delivery date: 10/11/23   Verified address: [address] [place], [place] [address]   Medication will be filled on 10/10/23.

## 2023-10-08 ENCOUNTER — Other Ambulatory Visit: Payer: Self-pay

## 2023-10-08 MED ORDER — ABEMACICLIB 100 MG PO TABS
100.0000 mg | ORAL_TABLET | Freq: Two times a day (BID) | ORAL | 1 refills | Status: DC
  Filled 2023-10-08: qty 56, 28d supply, fill #0
  Filled 2023-11-07: qty 56, 28d supply, fill #1

## 2023-10-10 ENCOUNTER — Other Ambulatory Visit: Payer: Self-pay

## 2023-10-10 NOTE — Therapy (Signed)
OUTPATIENT PHYSICAL THERAPY THORACOLUMBAR TREATMENT NOTE    Patient Name: Kelli Estrada MRN: [id number] DOB:[birth year], 61 y.o., female Today's Date: 10/11/2023   END OF SESSION:  PT End of Session - 10/11/23 1411     Visit Number 16    Date for PT Re-Evaluation 10/30/23    Authorization Type [employer] DUAL [employer]PLETE    Progress Note Due on Visit 20    PT Start Time 1300    PT Stop Time 1340    PT Time Calculation (min) 40 min    Activity Tolerance Patient limited by pain    Behavior During Therapy Flat affect                         Past Medical History:  Diagnosis Date   Anxiety    Chronic pain    Depression    Family history of breast cancer 08/23/2022   H/O degenerative disc disease    History of radiation therapy    Left breast- 10/25/22-12/11/22- Dr. Antony Blackbird   History of radiation therapy    Left breast-10/25/22-12/11/22- Dr. Antony Blackbird   Pre-diabetes    PTSD (post-traumatic stress disorder)    Sleep apnea    Past Surgical History:  Procedure Laterality Date   ABDOMINAL HYSTERECTOMY     BREAST LUMPECTOMY WITH SENTINEL LYMPH NODE BIOPSY Left 09/14/2022   Procedure: LEFT BREAST CENTRAL LUMPECTOMY WITH SENTINEL LYMPH NODE BX;  Surgeon: Griselda Miner, MD;  Location: [facility];  Service: General;  Laterality: Left;   COLONOSCOPY WITH PROPOFOL N/A 03/15/2016   Procedure: COLONOSCOPY WITH PROPOFOL;  Surgeon: Dorena Cookey, MD;  Location: [facility];  Service: Endoscopy;  Laterality: N/A;   NECK SURGERY     Patient Active Problem List   Diagnosis Date Noted   Genetic testing 08/27/2022   Family history of breast cancer 08/23/2022   Malignant neoplasm of central portion of left breast in female, estrogen receptor positive (HCC) 08/20/2022   Chronic pain of left knee 06/03/2015   Depression, major, recurrent, moderate (HCC) 06/03/2015   Morbid obesity with body mass index of 50 or higher (HCC) 06/03/2015   Vitamin D  deficiency 06/03/2015   PTSD (post-traumatic stress disorder) 06/03/2015   Obstructive sleep apnea 06/03/2015    REFERRING PROVIDER: Courtney Paris, NP   REFERRING DIAG: Lumbago with sciatica, unspecified side [M54.40]   Rationale for Evaluation and Treatment: Rehabilitation  THERAPY DIAG:  Other low back pain  Muscle weakness (generalized)  Difficulty in walking, not elsewhere classified  Abnormal posture  Aftercare following surgery for neoplasm  ONSET DATE: Several years.   SUBJECTIVE:  SUBJECTIVE STATEMENT: Friday pool went well, Saturday was fine, when I woke up on Sunday I had a pain in my left back and buttocks that was horrible. I have had this pain all week. I have tried medicine, changing positions, and topicals. It is still really bad.  PERTINENT HISTORY:  Anxiety, Depression, DDD, Pre diabetes, PTSD, Sleep apnea, hx of breast cancer.   PAIN: 09/18/2023 Are you having pain? Yes, LT lumbar and Lt buttocks. 8-9/10, better laying down worse sitting for long periods or walking.  PRECAUTIONS: None  RED FLAGS: None   WEIGHT BEARING RESTRICTIONS: No  FALLS:  Has patient fallen in last 6 months? 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  10/24:  I want to be able to travel to [place] with my friend  OBJECTIVE:  Note: Objective measures were completed at Evaluation unless otherwise noted.  DIAGNOSTIC FINDINGS:  None in chart. [facility] per pt.   PATIENT SURVEYS:  ODI: 29/50 58%   09/18/2023: ODI 25/50 50%  SCREENING FOR RED FLAGS: Bowel or bladder incontinence: Yes: incontinence   COGNITION: Overall cognitive status: Within functional limits for tasks assessed     SENSATION: WFL  POSTURE: rounded shoulders and decreased lumbar lordosis  PALPATION: No tenderness to   LUMBAR ROM:   Overall ROM is WFL with pt reporting pain with movement. Hip hinge with lumbar flexion.   LOWER EXTREMITY ROM:     Overall ROM is WFL with pt reporting pain with movement.   LOWER EXTREMITY MMT:    MMT Right eval Left eval  Hip flexion 4 4+  Knee flexion 4+ 4+  Knee extension 4+ 4+   (Blank rows = not tested)  FUNCTIONAL TESTS:  5 times sit to stand: 19.84 sec Timed up and go (TUG): 19.43 sec with SPC.   08/26/2023 : 286FT  08/30/23: 5x STS 18.17 sec  09/18/2023      5STS: 18.76 sec No UE support      TUG:15.66 sec with SPC  GAIT: Distance walked: 44ft Assistive device utilized: Single point cane Level of assistance: Complete Independence Comments: Pt walks with decreased stride and use of her AD.   TODAY'S TREATMENT:                                                                                                                              DATE:   10/11/23:Pt arrives for aquatic physical therapy. Treatment took place in 3.5-5.5 feet of water. Water temperature was 91 degrees F. Pt entered the pool via stairs, step to step  with heavy use of the rails. Pt requires buoyancy of water for support and to offload joints with strengthening exercises.  Seated water bench with 75% submersion Pt performed seated LE AROM exercises 20x in all planes, concurrent   pain assessment. Vertical decompression hang 5 min with yellow noodle. Seated Lt hip stretch with PTA helping hold LE down due to pt's buoyancy. 3x30 sec. Standing against wall LT single knee to chest 5x 30 sec. Followed by another 6 min vertical decompression hang. PTA suggested pt also try some ice to her  buttocks for home. Ice parameters were verbally discussed and verbally understood.   10/04/23:Pt arrives for aquatic physical therapy. Treatment took place in 3.55.5 feet of water. Water temperature was 91 degrees F. Pt entered the pool via stairs, step to step  with heavy use of the rails. Pt requires buoyancy of water for support and to offload joints with strengthening exercises.  Seated water bench with 75% submersion Pt performed seated LE AROM exercises 20x in all planes, concurrent   pain assessment. 75% water walking 10x in each direction holding the single buoy hand wts encouraging some UE push/pull today and picking up her pace. Bil hip kicks 20x in each direction with ankle fins for extra drag, pt requires holding onto pool wall for balance. Standing heel lifts 2x15, Seated hamstring stretch 2x 20 sec Bil. High knee march across pool holding large noodle 6x. Single knee extension pushing blue noodle down 10x Bil.Marland Kitchen 8 min underwater bicycle in horseback. Decompression float 10 min for pain/stress management emphasis on low back stretching in 5 ft end.    10/02/23:Pt arrives for aquatic physical therapy. Treatment took place in 3.5-5.5 feet of water. Water temperature was 91 degrees F. Pt entered the pool via stairs, step to step  with heavy use of the rails. Pt requires buoyancy of water for support and to offload joints with strengthening exercises.  Seated water bench with 75% submersion Pt performed seated LE AROM exercises 20x in all planes, concurrent   pain assessment. 75% water walking 10x in each direction holding the single buoy hand wts encouraging some UE push/pull today and picking up her pace. Bil hip kicks 20x in each direction with ankle fins for extra drag, pt requires holding onto pool wall for balance. Standing heel lifts 2x15, Seated hamstring stretch 2x 20 sec Bil. High knee march across pool holding large noodle 6x.. 8 min underwater bicycle in horseback. Decompression float  10 min for pain/stress management emphasis on low back stretching in 5 ft end.     PATIENT EDUCATION:  Education details: reacquainting to aquatic therapy Person educated: Patient Education method: Explanation Education comprehension: verbalized understanding  HOME EXERCISE PROGRAM: Access Code: [email or web address] URL: [email or web address] Date: 08/01/2023 Prepared by: Royal Hawthorn  Exercises - Supine Lower Trunk Rotation  - 2 x daily - 7 x weekly - 2 sets - 10 reps - Seated Hamstring Stretch  - 2 x daily - 7 x weekly - 2 sets - 2 reps - 30 hold - Supine Pelvic Tilt  - 1 x daily - 7 x weekly - 3 sets - 10 reps - Supine Transversus Abdominis Bracing - Hands on Stomach  - 1 x daily - 7 x weekly - 2 sets - 10 reps - 10 hold  ASSESSMENT:  CLINICAL IMPRESSION:  Pt arrives to aquatic PT today with LT lumbar and/or hip flare up. Pt is not aware of any reason for this flare up. She was educated in use of ice to try at home in addition to some gentle hip stretches we completed in the pool today. If they  did not exacerbate her pain she was instructed to continue with easy stretching 3x day.Pt verbally understood the instructions.She is also to call her doctor if she is no better or worse by Sunday.  OBJECTIVE IMPAIRMENTS: decreased activity tolerance, difficulty walking, decreased balance, decreased endurance, decreased mobility, decreased ROM, decreased strength, impaired flexibility, impaired UE/LE use, postural dysfunction, and pain.  ACTIVITY LIMITATIONS: bending, lifting, carry, locomotion, cleaning, community activity, driving, and or occupation  PERSONAL FACTORS: Anxiety, Depression, DDD, Pre diabetes, PTSD, Sleep apnea, hx of breast cancer.  are also affecting patient's functional outcome.  REHAB POTENTIAL: Good  CLINICAL DECISION MAKING: Evolving/moderate complexity  EVALUATION COMPLEXITY: Moderate    GOALS: Short term PT Goals Target date: 08/15/2023 Pt will be I and  compliant with HEP. Baseline:  Goal status:MET 09/18/2023 Pt will decrease pain by 25% overall Baseline:  Goal status: IN PROGRESS 09/18/2023- Patient reports no changes in pain levels  Long term PT goals Target date: 10/30/2023  Pt will improve ROM to [facility] with 4/10 pain to improve functional mobility Baseline: Goal status: IN PROGRESS 09/18/2023  Pt will improve  hip/knee strength to at least 5-/5 MMT to improve functional strength Baseline: Goal status:IN PROGRESS 09/18/2023  Pt will improve ODI to at least 15 points functional to show improved function Baseline: Goal status: IN PROGRESS 09/18/2023 - 25/50 50%  Pt will reduce pain by overall 50% overall with usual activity Baseline: Goal status: IN PROGRESS 09/18/2023- Patient reports no changes in pain levels  Pt will decrease TUG to below 14 seconds in order to demonstrate decreased fall risk Baseline: Goal status: IN PROGRESS 09/18/2023 - 15.66sec  Pt will decrease STS by  3.6 seconds in order to demonstrate decreased fall risk. - in progress, 08/30/23 Baseline: Goal status: IN PROGRESS 09/18/2023 - 18.76  Pt will be able to ambulate community distances at least 1000 ft with AD and gait pattern without complaints Baseline: Goal status: IN PROGRESS 09/18/2023 -  PLAN: PT FREQUENCY: 1-2 times per week   PT DURATION: 6 weeks  PLANNED INTERVENTIONS (unless contraindicated): aquatic PT, Canalith repositioning, cryotherapy, Electrical stimulation, Iontophoresis with 4 mg/ml dexamethasome, Moist heat, traction, Ultrasound, gait training, Therapeutic exercise, balance training, neuromuscular re-education, patient/family education, prosthetic training, manual techniques, passive ROM, dry needling, taping, vasopnuematic device, vestibular, spinal manipulations, joint manipulations  PLAN FOR NEXT SESSION: She how her hip pain is doing, try to resume prior exercise.  Ane Payment, PTA 10/11/23 2:12 PM

## 2023-10-11 ENCOUNTER — Encounter: Payer: Self-pay | Admitting: Physical Therapy

## 2023-10-11 ENCOUNTER — Ambulatory Visit: Payer: 59 | Admitting: Physical Therapy

## 2023-10-11 DIAGNOSIS — R262 Difficulty in walking, not elsewhere classified: Secondary | ICD-10-CM

## 2023-10-11 DIAGNOSIS — M5459 Other low back pain: Secondary | ICD-10-CM

## 2023-10-11 DIAGNOSIS — R293 Abnormal posture: Secondary | ICD-10-CM

## 2023-10-11 DIAGNOSIS — M6281 Muscle weakness (generalized): Secondary | ICD-10-CM

## 2023-10-11 DIAGNOSIS — Z483 Aftercare following surgery for neoplasm: Secondary | ICD-10-CM

## 2023-10-17 NOTE — Therapy (Signed)
 OUTPATIENT PHYSICAL THERAPY THORACOLUMBAR TREATMENT NOTE    Patient Name: Kelli Estrada MRN: [id number] DOB:Mar 07, 1961, 63 y.o., female Today's Date: 10/18/2023   END OF SESSION:  PT End of Session - 10/18/23 1618     Visit Number 17    Date for PT Re-Evaluation 10/30/23    Authorization Type [employer] DUAL COMPLETE    Progress Note Due on Visit 20    PT Start Time 1345    PT Stop Time 1430    PT Time Calculation (min) 45 min    Activity Tolerance Patient tolerated treatment well    Behavior During Therapy [facility] for tasks assessed/performed                          Past Medical History:  Diagnosis Date   Anxiety    Chronic pain    Depression    Family history of breast cancer 08/23/2022   H/O degenerative disc disease    History of radiation therapy    Left breast- 10/25/22-12/11/22- Dr. Lynwood Nasuti   History of radiation therapy    Left breast-10/25/22-12/11/22- Dr. Lynwood Nasuti   Pre-diabetes    PTSD (post-traumatic stress disorder)    Sleep apnea    Past Surgical History:  Procedure Laterality Date   ABDOMINAL HYSTERECTOMY     BREAST LUMPECTOMY WITH SENTINEL LYMPH NODE BIOPSY Left 09/14/2022   Procedure: LEFT BREAST CENTRAL LUMPECTOMY WITH SENTINEL LYMPH NODE BX;  Surgeon: Curvin Deward MOULD, MD;  Location:  [facility];  Service: General;  Laterality: Left;   COLONOSCOPY WITH PROPOFOL  N/A 03/15/2016   Procedure: COLONOSCOPY WITH PROPOFOL ;  Surgeon: Norleen Hint, MD;  Location: [facility];  Service: Endoscopy;  Laterality: N/A;   NECK SURGERY     Patient Active Problem List   Diagnosis Date Noted   Genetic testing 08/27/2022   Family history of breast cancer 08/23/2022   Malignant neoplasm of central portion of left breast in female, estrogen receptor positive (HCC) 08/20/2022   Chronic pain of left knee 06/03/2015   Depression, major, recurrent, moderate (HCC) 06/03/2015   Morbid obesity with body mass index of 50 or higher (HCC)  06/03/2015   Vitamin D deficiency 06/03/2015   PTSD (post-traumatic stress disorder) 06/03/2015   Obstructive sleep apnea 06/03/2015    REFERRING PROVIDER: Leron Millman, NP   REFERRING DIAG: Lumbago with sciatica, unspecified side [M54.40]   Rationale for Evaluation and Treatment: Rehabilitation  THERAPY DIAG:  Other low back pain  Muscle weakness (generalized)  Difficulty in walking, not elsewhere classified  Abnormal posture  ONSET DATE: Several years.   SUBJECTIVE:  SUBJECTIVE STATEMENT: The pain I was having last week went away in about 24 hours. My Rt knee still bothers me like usual. I feel much better today.  PERTINENT HISTORY:  Anxiety, Depression, DDD, Pre diabetes, PTSD, Sleep apnea, hx of breast cancer.   PAIN: 09/18/2023 Are you having pain? Yes, Rt knee 4-6/10, better laying down worse walking.  PRECAUTIONS: None  RED FLAGS: None   WEIGHT BEARING RESTRICTIONS: No  FALLS:  Has patient fallen in last 6 months? No  LIVING ENVIRONMENT: Lives with: lives with their daughter Lives in: House/apartment Stairs: Yes: External: 4-5 steps; on right going up Has following equipment at home: Retail Banker - 2 wheeled  OCCUPATION: Retired  PLOF: Independent  PATIENT GOALS: Pt would like to have more mobility and reduce pain.  10/24:  I want to be able to travel to [place]  with my friend  OBJECTIVE:  Note: Objective measures were completed at Evaluation unless otherwise noted.  DIAGNOSTIC FINDINGS:  None in chart. [facility] per pt.   PATIENT SURVEYS:  ODI: 29/50 58%  09/18/2023: ODI 25/50 50%  SCREENING FOR RED FLAGS: Bowel or bladder incontinence: Yes: incontinence   COGNITION: Overall cognitive status: Within functional limits for  tasks assessed     SENSATION: WFL  POSTURE: rounded shoulders and decreased lumbar lordosis  PALPATION: No tenderness to   LUMBAR ROM:   Overall ROM is WFL with pt reporting pain with movement. Hip hinge with lumbar flexion.   LOWER EXTREMITY ROM:     Overall ROM is WFL with pt reporting pain with movement.   LOWER EXTREMITY MMT:    MMT Right eval Left eval  Hip flexion 4 4+  Knee flexion 4+ 4+  Knee extension 4+ 4+   (Blank rows = not tested)  FUNCTIONAL TESTS:  5 times sit to stand: 19.84 sec Timed up and go (TUG): 19.43 sec with SPC.   08/26/2023 : 286FT  08/30/23: 5x STS 18.17 sec  09/18/2023      5STS: 18.76 sec No UE support      TUG:15.66 sec with SPC  GAIT: Distance walked: 42ft Assistive device utilized: Single point cane Level of assistance: Complete Independence Comments: Pt walks with decreased stride and use of her AD.   TODAY'S TREATMENT:                                                                                                                              DATE:   10/18/23:Pt arrives for aquatic physical therapy. Treatment took place in 3.55.5 feet of water. Water temperature was 91 degrees F. Pt entered the pool via stairs, step to step  with heavy use of the rails. Pt requires buoyancy of water for support and to offload joints with strengthening exercises.  Seated water bench with 75% submersion Pt performed seated LE AROM exercises 20x in all planes, concurrent   pain assessment. 75% water walking 10x in each  direction holding the single buoy hand wts encouraging some UE push/pull today and picking up her pace. Bil hip kicks 20x in each direction with ankle fins for extra drag, pt requires holding onto pool wall for balance. Standing heel lifts 2x15, Seated hamstring stretch 2x 20 sec Bil. High knee march across pool holding large noodle 6x. Single knee extension pushing blue noodle down 10x Bil.SABRA 8 min underwater bicycle in horseback.  Decompression float 10 min for pain/stress management emphasis on low back stretching in 5 ft end.  10/11/23:Pt arrives for aquatic physical therapy. Treatment took place in 3.5-5.5 feet of water. Water temperature was 91 degrees F. Pt entered the pool via stairs, step to step  with heavy use of the rails. Pt requires buoyancy of water for support and to offload joints with strengthening exercises.  Seated water bench with 75% submersion Pt performed seated LE AROM exercises 20x in all planes, concurrent   pain assessment. Vertical decompression hang 5 min with yellow noodle. Seated Lt hip stretch with PTA helping hold LE down due to pt's buoyancy. 3x30 sec. Standing against wall LT single knee to chest 5x 30 sec. Followed by another 6 min vertical decompression hang. PTA suggested pt also try some ice to her buttocks for home. Ice parameters were verbally discussed and verbally understood.   10/04/23:Pt arrives for aquatic physical therapy. Treatment took place in 3.55.5 feet of water. Water temperature was 91 degrees F. Pt entered the pool via stairs, step to step  with heavy use of the rails. Pt requires buoyancy of water for support and to offload joints with strengthening exercises.  Seated water bench with 75% submersion Pt performed seated LE AROM exercises 20x in all planes, concurrent   pain assessment. 75% water walking 10x in each direction holding the single buoy hand wts encouraging some UE push/pull today and picking up her pace. Bil hip kicks 20x in each direction with ankle fins for extra drag, pt requires holding onto pool wall for balance. Standing heel lifts 2x15, Seated hamstring stretch 2x 20 sec Bil. High knee march across pool holding large noodle 6x. Single knee extension pushing blue noodle down 10x Bil.SABRA 8 min underwater bicycle in horseback. Decompression float 10 min for pain/stress management emphasis on low back stretching in 5 ft end.      PATIENT EDUCATION:  Education  details: reacquainting to aquatic therapy Person educated: Patient Education method: Explanation Education comprehension: verbalized understanding  HOME EXERCISE PROGRAM: Access Code: [email or web address] URL: [email or web address] Date: 08/01/2023 Prepared by: Rojean Batten  Exercises - Supine Lower Trunk Rotation  - 2 x daily - 7 x weekly - 2 sets - 10 reps - Seated Hamstring Stretch  - 2 x daily - 7 x weekly - 2 sets - 2 reps - 30 hold - Supine Pelvic Tilt  - 1 x daily - 7 x weekly - 3 sets - 10 reps - Supine Transversus Abdominis Bracing - Hands on Stomach  - 1 x daily - 7 x weekly - 2 sets - 10 reps - 10 hold  ASSESSMENT:  CLINICAL IMPRESSION: Pt's pain she reported last session abolished within 24 hrs. Her Rt knee remains constantly sore. Pt was able to resume her normal exercises with no need for modification. Pt's mood was much more positive.   OBJECTIVE IMPAIRMENTS: decreased activity tolerance, difficulty walking, decreased balance, decreased endurance, decreased mobility, decreased ROM, decreased strength, impaired flexibility, impaired UE/LE use, postural dysfunction, and pain.  ACTIVITY LIMITATIONS: bending, lifting, carry, locomotion,  cleaning, community activity, driving, and or occupation  PERSONAL FACTORS: Anxiety, Depression, DDD, Pre diabetes, PTSD, Sleep apnea, hx of breast cancer.  are also affecting patient's functional outcome.  REHAB POTENTIAL: Good  CLINICAL DECISION MAKING: Evolving/moderate complexity  EVALUATION COMPLEXITY: Moderate    GOALS: Short term PT Goals Target date: 08/15/2023 Pt will be I and compliant with HEP. Baseline:  Goal status:MET 09/18/2023 Pt will decrease pain by 25% overall Baseline:  Goal status: IN PROGRESS 09/18/2023- Patient reports no changes in pain levels  Long term PT goals Target date: 10/30/2023  Pt will improve ROM to [facility] with 4/10 pain to improve functional mobility Baseline: Goal status: IN PROGRESS  09/18/2023  Pt will improve  hip/knee strength to at least 5-/5 MMT to improve functional strength Baseline: Goal status:IN PROGRESS 09/18/2023  Pt will improve ODI to at least 15 points functional to show improved function Baseline: Goal status: IN PROGRESS 09/18/2023 - 25/50 50%  Pt will reduce pain by overall 50% overall with usual activity Baseline: Goal status: IN PROGRESS 09/18/2023- Patient reports no changes in pain levels  Pt will decrease TUG to below 14 seconds in order to demonstrate decreased fall risk Baseline: Goal status: IN PROGRESS 09/18/2023 - 15.66sec  Pt will decrease STS by  3.6 seconds in order to demonstrate decreased fall risk. - in progress, 08/30/23 Baseline: Goal status: IN PROGRESS 09/18/2023 - 18.76  Pt will be able to ambulate community distances at least 1000 ft with AD and gait pattern without complaints Baseline: Goal status: IN PROGRESS 09/18/2023 -  PLAN: PT FREQUENCY: 1-2 times per week   PT DURATION: 6 weeks  PLANNED INTERVENTIONS (unless contraindicated): aquatic PT, Canalith repositioning, cryotherapy, Electrical stimulation, Iontophoresis with 4 mg/ml dexamethasome, Moist heat, traction, Ultrasound, gait training, Therapeutic exercise, balance training, neuromuscular re-education, patient/family education, prosthetic training, manual techniques, passive ROM, dry needling, taping, vasopnuematic device, vestibular, spinal manipulations, joint manipulations  PLAN FOR NEXT SESSION: Pt will more than likely end aquatic Pt at the end of her POC and join the PATRIC Delon Darner, PTA 10/18/23 4:19 PM

## 2023-10-18 ENCOUNTER — Ambulatory Visit: Payer: 59 | Attending: Nurse Practitioner | Admitting: Physical Therapy

## 2023-10-18 ENCOUNTER — Encounter: Payer: Self-pay | Admitting: Physical Therapy

## 2023-10-18 DIAGNOSIS — R262 Difficulty in walking, not elsewhere classified: Secondary | ICD-10-CM | POA: Diagnosis present

## 2023-10-18 DIAGNOSIS — M6281 Muscle weakness (generalized): Secondary | ICD-10-CM | POA: Diagnosis present

## 2023-10-18 DIAGNOSIS — M5459 Other low back pain: Secondary | ICD-10-CM | POA: Diagnosis present

## 2023-10-18 DIAGNOSIS — R293 Abnormal posture: Secondary | ICD-10-CM | POA: Insufficient documentation

## 2023-10-22 ENCOUNTER — Encounter (HOSPITAL_BASED_OUTPATIENT_CLINIC_OR_DEPARTMENT_OTHER): Payer: Self-pay | Admitting: Physical Therapy

## 2023-10-22 ENCOUNTER — Ambulatory Visit (HOSPITAL_BASED_OUTPATIENT_CLINIC_OR_DEPARTMENT_OTHER): Payer: 59 | Attending: Nurse Practitioner | Admitting: Physical Therapy

## 2023-10-22 DIAGNOSIS — R293 Abnormal posture: Secondary | ICD-10-CM | POA: Diagnosis present

## 2023-10-22 DIAGNOSIS — M5459 Other low back pain: Secondary | ICD-10-CM | POA: Diagnosis present

## 2023-10-22 DIAGNOSIS — R262 Difficulty in walking, not elsewhere classified: Secondary | ICD-10-CM | POA: Insufficient documentation

## 2023-10-22 DIAGNOSIS — M6281 Muscle weakness (generalized): Secondary | ICD-10-CM | POA: Insufficient documentation

## 2023-10-22 NOTE — Therapy (Signed)
 OUTPATIENT PHYSICAL THERAPY THORACOLUMBAR TREATMENT NOTE    Patient Name: Kelli Estrada MRN: [id number] DOB:Oct 23, 1960, 63 y.o., female Today's Date: 10/22/2023   END OF SESSION:  PT End of Session - 10/22/23 1410     Visit Number 18    Date for PT Re-Evaluation 10/30/23    Authorization Type [employer] DUAL COMPLETE    Progress Note Due on Visit 20    PT Start Time 1400    PT Stop Time 1440    PT Time Calculation (min) 40 min    Behavior During Therapy [facility] for tasks assessed/performed              Past Medical History:  Diagnosis Date   Anxiety    Chronic pain    Depression    Family history of breast cancer 08/23/2022   H/O degenerative disc disease    History of radiation therapy    Left breast- 10/25/22-12/11/22- Dr. Lynwood Nasuti   History of radiation therapy    Left breast-10/25/22-12/11/22- Dr. Lynwood Nasuti   Pre-diabetes    PTSD (post-traumatic stress disorder)    Sleep apnea    Past Surgical History:  Procedure Laterality Date   ABDOMINAL HYSTERECTOMY     BREAST LUMPECTOMY WITH SENTINEL LYMPH NODE BIOPSY Left 09/14/2022   Procedure: LEFT BREAST CENTRAL LUMPECTOMY WITH SENTINEL LYMPH NODE BX;  Surgeon: Curvin Deward MOULD, MD;  Location: [facility];  Service: General;  Laterality: Left;   COLONOSCOPY WITH PROPOFOL  N/A 03/15/2016   Procedure: COLONOSCOPY WITH PROPOFOL ;  Surgeon: Norleen Hint, MD;  Location: [facility];  Service: Endoscopy;  Laterality: N/A;   NECK SURGERY     Patient Active Problem List   Diagnosis Date Noted   Genetic testing 08/27/2022   Family history of breast cancer 08/23/2022   Malignant neoplasm of central portion of left breast in female, estrogen receptor positive (HCC) 08/20/2022   Chronic pain of left knee 06/03/2015   Depression, major, recurrent, moderate (HCC) 06/03/2015   Morbid obesity with body mass index of 50 or higher (HCC) 06/03/2015   Vitamin D deficiency 06/03/2015   PTSD (post-traumatic stress  disorder) 06/03/2015   Obstructive sleep apnea 06/03/2015    REFERRING PROVIDER: Leron Millman, NP   REFERRING DIAG: Lumbago with sciatica, unspecified side [M54.40]   Rationale for Evaluation and Treatment: Rehabilitation  THERAPY DIAG:  Other low back pain  Muscle weakness (generalized)  Difficulty in walking, not elsewhere classified  Abnormal posture  ONSET DATE: Several years.   SUBJECTIVE:  SUBJECTIVE STATEMENT: Pt reports that she gets about a day of relief from aquatic sessions.  She plans to look into the [facility] and attending with her granddaughter. She states she's had a lot of stress lately.   PERTINENT HISTORY:  Anxiety, Depression, DDD, Pre diabetes, PTSD, Sleep apnea, hx of breast cancer.   PAIN: 09/18/2023 Are you having pain? Yes, 9/10, in spine from neck to buttocks, as well as knees.  better laying down worse walking.  PRECAUTIONS: None  RED FLAGS: None   WEIGHT BEARING RESTRICTIONS: No  FALLS:  Has patient fallen in last 6 months? No  LIVING ENVIRONMENT: Lives with: lives with their daughter Lives in: House/apartment Stairs: Yes: External: 4-5 steps; on right going up Has following equipment at home: [employer] - 2 wheeled  OCCUPATION: Retired  PLOF: Independent  PATIENT GOALS: Pt would like to have more mobility and reduce pain.  10/24:  I want to be able to travel to [place]  with my friend  OBJECTIVE:  Note: Objective measures were completed at Evaluation unless otherwise noted.  DIAGNOSTIC FINDINGS:  None in chart. [facility] per pt.   PATIENT SURVEYS:  ODI: 29/50 58%  09/18/2023: ODI 25/50 50%  SCREENING FOR RED FLAGS: Bowel or bladder incontinence: Yes: incontinence   COGNITION: Overall cognitive  status: Within functional limits for tasks assessed     SENSATION: WFL  POSTURE: rounded shoulders and decreased lumbar lordosis  PALPATION: No tenderness to   LUMBAR ROM:   Overall ROM is WFL with pt reporting pain with movement. Hip hinge with lumbar flexion.   LOWER EXTREMITY ROM:     Overall ROM is WFL with pt reporting pain with movement.   LOWER EXTREMITY MMT:    MMT Right eval Left eval  Hip flexion 4 4+  Knee flexion 4+ 4+  Knee extension 4+ 4+   (Blank rows = not tested)  FUNCTIONAL TESTS:  5 times sit to stand: 19.84 sec Timed up and go (TUG): 19.43 sec with SPC.   08/26/2023 : 286FT  08/30/23: 5x STS 18.17 sec  09/18/2023      5STS: 18.76 sec No UE support      TUG:15.66 sec with SPC  GAIT: Distance walked: 75ft Assistive device utilized: Single point cane Level of assistance: Complete Independence Comments: Pt walks with decreased stride and use of her AD.   TODAY'S TREATMENT:                                                                                                                              DATE:  10/22/23 Pt seen for aquatic therapy today.  Treatment took place in water 3.5-4.75 ft in depth at the [facility] pool. Temp of water was 91.  Pt entered/exited the pool via stairs independently with bilat rail. * seated on bench:  cycling, hip abdct/ addct with DF, alternating LAQ * walking forward/ backwards with  reciprocal row  with arms with rainbow hand floats  * side stepping with arm addct/ abdct with rainbow hand floats x 2 laps * Holding wall:  leg swings into hip flexion/ hip ext to toe touch x 10; Hip abdct/ addct x 10; , hip openers/ (knee flexion with hip abdct), single leg clams x 10, * return to walking forward/ backward with reciprocal arm swing  * straddling noodle with relaxed arms/ breast stroke:   cycling   10/18/23:Pt arrives for aquatic physical therapy. Treatment took place in 3.55.5 feet of water. Water  temperature was 91 degrees F. Pt entered the pool via stairs, step to step  with heavy use of the rails. Pt requires buoyancy of water for support and to offload joints with strengthening exercises.  Seated water bench with 75% submersion Pt performed seated LE AROM exercises 20x in all planes, concurrent   pain assessment. 75% water walking 10x in each direction holding the single buoy hand wts encouraging some UE push/pull today and picking up her pace. Bil hip kicks 20x in each direction with ankle fins for extra drag, pt requires holding onto pool wall for balance. Standing heel lifts 2x15, Seated hamstring stretch 2x 20 sec Bil. High knee march across pool holding large noodle 6x. Single knee extension pushing blue noodle down 10x Bil.SABRA 8 min underwater bicycle in horseback. Decompression float 10 min for pain/stress management emphasis on low back stretching in 5 ft end.  10/11/23:Pt arrives for aquatic physical therapy. Treatment took place in 3.5-5.5 feet of water. Water temperature was 91 degrees F. Pt entered the pool via stairs, step to step  with heavy use of the rails. Pt requires buoyancy of water for support and to offload joints with strengthening exercises.  Seated water bench with 75% submersion Pt performed seated LE AROM exercises 20x in all planes, concurrent   pain assessment. Vertical decompression hang 5 min with yellow noodle. Seated Lt hip stretch with PTA helping hold LE down due to pt's buoyancy. 3x30 sec. Standing against wall LT single knee to chest 5x 30 sec. Followed by another 6 min vertical decompression hang. PTA suggested pt also try some ice to her buttocks for home. Ice parameters were verbally discussed and verbally understood.   10/04/23:Pt arrives for aquatic physical therapy. Treatment took place in 3.55.5 feet of water. Water temperature was 91 degrees F. Pt entered the pool via stairs, step to step  with heavy use of the rails. Pt requires buoyancy of water for  support and to offload joints with strengthening exercises.  Seated water bench with 75% submersion Pt performed seated LE AROM exercises 20x in all planes, concurrent   pain assessment. 75% water walking 10x in each direction holding the single buoy hand wts encouraging some UE push/pull today and picking up her pace. Bil hip kicks 20x in each direction with ankle fins for extra drag, pt requires holding onto pool wall for balance. Standing heel lifts 2x15, Seated hamstring stretch 2x 20 sec Bil. High knee march across pool holding large noodle 6x. Single knee extension pushing blue noodle down 10x Bil.SABRA 8 min underwater bicycle in horseback. Decompression float 10 min for pain/stress management emphasis on low back stretching in 5 ft end.      PATIENT EDUCATION:  Education details: aquatic therapy exercise progressions/ modifications Person educated: Patient Education method: Explanation Education comprehension: verbalized understanding  HOME EXERCISE PROGRAM: Access Code: [email or web address] URL: [email or web address] Date: 08/01/2023 Prepared by: Rojean Batten  Exercises - Supine Lower Trunk Rotation  -  2 x daily - 7 x weekly - 2 sets - 10 reps - Seated Hamstring Stretch  - 2 x daily - 7 x weekly - 2 sets - 2 reps - 30 hold - Supine Pelvic Tilt  - 1 x daily - 7 x weekly - 3 sets - 10 reps - Supine Transversus Abdominis Bracing - Hands on Stomach  - 1 x daily - 7 x weekly - 2 sets - 10 reps - 10 hold  ASSESSMENT:  CLINICAL IMPRESSION: Pt reported gradual reduction of overall pain while exercising in the water. She plans to check out [facility] prior to next water appt and report back. Will plan to issue aquatic HEP on 1/13 for intention of d/c from aquatics at end of POC if appropriate.    OBJECTIVE IMPAIRMENTS: decreased activity tolerance, difficulty walking, decreased balance, decreased endurance, decreased mobility, decreased ROM, decreased strength, impaired flexibility,  impaired UE/LE use, postural dysfunction, and pain.  ACTIVITY LIMITATIONS: bending, lifting, carry, locomotion, cleaning, community activity, driving, and or occupation  PERSONAL FACTORS: Anxiety, Depression, DDD, Pre diabetes, PTSD, Sleep apnea, hx of breast cancer.  are also affecting patient's functional outcome.  REHAB POTENTIAL: Good  CLINICAL DECISION MAKING: Evolving/moderate complexity  EVALUATION COMPLEXITY: Moderate    GOALS: Short term PT Goals Target date: 08/15/2023 Pt will be I and compliant with HEP. Baseline:  Goal status:MET 09/18/2023 Pt will decrease pain by 25% overall Baseline:  Goal status: IN PROGRESS 09/18/2023- Patient reports no changes in pain levels  Long term PT goals Target date: 10/30/2023  Pt will improve ROM to [facility] with 4/10 pain to improve functional mobility Baseline: Goal status: IN PROGRESS 09/18/2023  Pt will improve  hip/knee strength to at least 5-/5 MMT to improve functional strength Baseline: Goal status:IN PROGRESS 09/18/2023  Pt will improve ODI to at least 15 points functional to show improved function Baseline: Goal status: IN PROGRESS 09/18/2023 - 25/50 50%  Pt will reduce pain by overall 50% overall with usual activity Baseline: Goal status: IN PROGRESS 09/18/2023- Patient reports no changes in pain levels  Pt will decrease TUG to below 14 seconds in order to demonstrate decreased fall risk Baseline: Goal status: IN PROGRESS 09/18/2023 - 15.66sec  Pt will decrease STS by  3.6 seconds in order to demonstrate decreased fall risk. - in progress, 08/30/23 Baseline: Goal status: IN PROGRESS 09/18/2023 - 18.76  Pt will be able to ambulate community distances at least 1000 ft with AD and gait pattern without complaints Baseline: Goal status: IN PROGRESS 09/18/2023 -  PLAN: PT FREQUENCY: 1-2 times per week   PT DURATION: 6 weeks  PLANNED INTERVENTIONS (unless contraindicated): aquatic PT, Canalith repositioning, cryotherapy,  Electrical stimulation, Iontophoresis with 4 mg/ml dexamethasome, Moist heat, traction, Ultrasound, gait training, Therapeutic exercise, balance training, neuromuscular re-education, patient/family education, prosthetic training, manual techniques, passive ROM, dry needling, taping, vasopnuematic device, vestibular, spinal manipulations, joint manipulations  PLAN FOR NEXT SESSION: Pt will more than likely end aquatic Pt at the end of her POC and join the PATRIC Delon Aquas, PTA 10/22/23 4:51 PM [facility] [address] [place], [place], [address] Phone: [phone]   Fax:  [phone]

## 2023-10-23 ENCOUNTER — Inpatient Hospital Stay: Payer: 59 | Admitting: Pharmacist

## 2023-10-23 ENCOUNTER — Inpatient Hospital Stay: Payer: 59 | Attending: Hematology and Oncology

## 2023-10-23 VITALS — BP 99/66 | HR 73 | Temp 98.0°F | Resp 16 | Wt 261.3 lb

## 2023-10-23 DIAGNOSIS — C50112 Malignant neoplasm of central portion of left female breast: Secondary | ICD-10-CM | POA: Diagnosis present

## 2023-10-23 DIAGNOSIS — Z170 Estrogen receptor positive status [ER+]: Secondary | ICD-10-CM | POA: Insufficient documentation

## 2023-10-23 DIAGNOSIS — Z79811 Long term (current) use of aromatase inhibitors: Secondary | ICD-10-CM | POA: Diagnosis not present

## 2023-10-23 DIAGNOSIS — Z1721 Progesterone receptor positive status: Secondary | ICD-10-CM | POA: Insufficient documentation

## 2023-10-23 DIAGNOSIS — Z1732 Human epidermal growth factor receptor 2 negative status: Secondary | ICD-10-CM | POA: Insufficient documentation

## 2023-10-23 LAB — CBC WITH DIFFERENTIAL (CANCER CENTER ONLY)
Abs Immature Granulocytes: 0.02 10*3/uL (ref 0.00–0.07)
Basophils Absolute: 0 10*3/uL (ref 0.0–0.1)
Basophils Relative: 0 %
Eosinophils Absolute: 0.2 10*3/uL (ref 0.0–0.5)
Eosinophils Relative: 4 %
HCT: 39.2 % (ref 36.0–46.0)
Hemoglobin: 12.8 g/dL (ref 12.0–15.0)
Immature Granulocytes: 1 %
Lymphocytes Relative: 21 %
Lymphs Abs: 0.9 10*3/uL (ref 0.7–4.0)
MCH: 29.5 pg (ref 26.0–34.0)
MCHC: 32.7 g/dL (ref 30.0–36.0)
MCV: 90.3 fL (ref 80.0–100.0)
Monocytes Absolute: 0.2 10*3/uL (ref 0.1–1.0)
Monocytes Relative: 6 %
Neutro Abs: 2.9 10*3/uL (ref 1.7–7.7)
Neutrophils Relative %: 68 %
Platelet Count: 136 10*3/uL — ABNORMAL LOW (ref 150–400)
RBC: 4.34 MIL/uL (ref 3.87–5.11)
RDW: 14.4 % (ref 11.5–15.5)
WBC Count: 4.1 10*3/uL (ref 4.0–10.5)
nRBC: 0 % (ref 0.0–0.2)

## 2023-10-23 LAB — CMP (CANCER CENTER ONLY)
ALT: 11 U/L (ref 0–44)
AST: 13 U/L — ABNORMAL LOW (ref 15–41)
Albumin: 3.9 g/dL (ref 3.5–5.0)
Alkaline Phosphatase: 69 U/L (ref 38–126)
Anion gap: 6 (ref 5–15)
BUN: 21 mg/dL (ref 8–23)
CO2: 29 mmol/L (ref 22–32)
Calcium: 9.3 mg/dL (ref 8.9–10.3)
Chloride: 106 mmol/L (ref 98–111)
Creatinine: 1.11 mg/dL — ABNORMAL HIGH (ref 0.44–1.00)
GFR, Estimated: 57 mL/min — ABNORMAL LOW (ref >60)
Glucose, Bld: 107 mg/dL — ABNORMAL HIGH (ref 70–99)
Potassium: 3.8 mmol/L (ref 3.5–5.1)
Sodium: 141 mmol/L (ref 135–145)
Total Bilirubin: 0.3 mg/dL (ref 0.0–1.2)
Total Protein: 6.7 g/dL (ref 6.5–8.1)
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
## 2023-10-24 ENCOUNTER — Encounter: Payer: Self-pay | Admitting: Nurse Practitioner

## 2023-10-25 ENCOUNTER — Ambulatory Visit: Payer: 59 | Admitting: Physical Therapy

## 2023-10-28 ENCOUNTER — Ambulatory Visit (HOSPITAL_BASED_OUTPATIENT_CLINIC_OR_DEPARTMENT_OTHER): Payer: 59 | Admitting: Physical Therapy

## 2023-10-28 ENCOUNTER — Encounter (HOSPITAL_BASED_OUTPATIENT_CLINIC_OR_DEPARTMENT_OTHER): Payer: Self-pay | Admitting: Physical Therapy

## 2023-10-28 DIAGNOSIS — M5459 Other low back pain: Secondary | ICD-10-CM | POA: Diagnosis not present

## 2023-10-28 DIAGNOSIS — R262 Difficulty in walking, not elsewhere classified: Secondary | ICD-10-CM

## 2023-10-28 DIAGNOSIS — M6281 Muscle weakness (generalized): Secondary | ICD-10-CM

## 2023-10-28 DIAGNOSIS — R293 Abnormal posture: Secondary | ICD-10-CM

## 2023-10-28 NOTE — Therapy (Signed)
 OUTPATIENT PHYSICAL THERAPY THORACOLUMBAR TREATMENT NOTE    Patient Name: Kelli Estrada MRN: [id number] DOB:28-Apr-1961, 63 y.o., female Today's Date: 10/28/2023   END OF SESSION:  PT End of Session - 10/28/23 1140     Visit Number 19    Date for PT Re-Evaluation 10/30/23    Authorization Type [employer] DUAL [employer]PLETE    Progress Note Due on Visit 20    PT Start Time 1148    PT Stop Time 1230    PT Time Calculation (min) 42 min    Behavior During Therapy [facility] for tasks assessed/performed              Past Medical History:  Diagnosis Date   Anxiety    Chronic pain    Depression    Family history of breast cancer 08/23/2022   H/O degenerative disc disease    History of radiation therapy    Left breast- 10/25/22-12/11/22- Dr. Lynwood Nasuti   History of radiation therapy    Left breast-10/25/22-12/11/22- Dr. Lynwood Nasuti   Pre-diabetes    PTSD (post-traumatic stress disorder)    Sleep apnea    Past Surgical History:  Procedure Laterality Date   ABDOMINAL HYSTERECTOMY     BREAST LUMPECTOMY WITH SENTINEL LYMPH NODE BIOPSY Left 09/14/2022   Procedure: LEFT BREAST CENTRAL LUMPECTOMY WITH SENTINEL LYMPH NODE BX;  Surgeon: Curvin Deward MOULD, MD;  Location: [facility];  Service: General;  Laterality: Left;   COLONOSCOPY WITH PROPOFOL  N/A 03/15/2016   Procedure: COLONOSCOPY WITH PROPOFOL ;  Surgeon: Norleen Hint, MD;  Location: [facility];  Service: Endoscopy;  Laterality: N/A;   NECK SURGERY     Patient Active Problem List   Diagnosis Date Noted   Genetic testing 08/27/2022   Family history of breast cancer 08/23/2022   Malignant neoplasm of central portion of left breast in female, estrogen receptor positive (HCC) 08/20/2022   Chronic pain of left knee 06/03/2015   Depression, major, recurrent, moderate (HCC) 06/03/2015   Morbid obesity with body mass index of 50 or higher (HCC) 06/03/2015   Vitamin D deficiency 06/03/2015   PTSD (post-traumatic stress  disorder) 06/03/2015   Obstructive sleep apnea 06/03/2015    REFERRING PROVIDER: Leron Millman, NP   REFERRING DIAG: Lumbago with sciatica, unspecified side [M54.40]   Rationale for Evaluation and Treatment: Rehabilitation  THERAPY DIAG:  Other low back pain  Muscle weakness (generalized)  Difficulty in walking, not elsewhere classified  Abnormal posture  ONSET DATE: Several years.   SUBJECTIVE:  SUBJECTIVE STATEMENT:   It's (Rt knee) has been hurting like crazy lately.  She reports that her back has been not as bad; she has been taking medicine regularly. She has a knee brace that she wears that helps her knee and her back.     She plans to look into the [facility] and attending with her granddaughter. She is going to try to get there this week.   PERTINENT HISTORY:  Anxiety, Depression, DDD, Pre diabetes, PTSD, Sleep apnea, hx of breast cancer.   PAIN: 09/18/2023 Are you having pain? Yes, 5-6/10, knees;  4-5/10 in back Better:  laying down, pain relievers, knee braces Worse: walking  PRECAUTIONS: None  RED FLAGS: None   WEIGHT BEARING RESTRICTIONS: No  FALLS:  Has patient fallen in last 6 months? No  LIVING ENVIRONMENT: Lives with: lives with their daughter Lives in: House/apartment Stairs: Yes: External: 4-5 steps; on right going up Has following equipment at home: [employer] - 2 wheeled  OCCUPATION: Retired  PLOF: Independent  PATIENT GOALS: Pt would like to have more mobility and reduce pain.  10/24:  I want to be able to travel to [place]  with my friend  OBJECTIVE:  Note: Objective measures were completed at Evaluation unless otherwise noted.  DIAGNOSTIC FINDINGS:  None in chart. [facility] per pt.   PATIENT SURVEYS:  ODI:  29/50 58%  09/18/2023: ODI 25/50 50%  SCREENING FOR RED FLAGS: Bowel or bladder incontinence: Yes: incontinence   COGNITION: Overall cognitive status: Within functional limits for tasks assessed     SENSATION: WFL  POSTURE: rounded shoulders and decreased lumbar lordosis  PALPATION: No tenderness to   LUMBAR ROM:   Overall ROM is WFL with pt reporting pain with movement. Hip hinge with lumbar flexion.   LOWER EXTREMITY ROM:     Overall ROM is WFL with pt reporting pain with movement.   LOWER EXTREMITY MMT:    MMT Right eval Left eval  Hip flexion 4 4+  Knee flexion 4+ 4+  Knee extension 4+ 4+   (Blank rows = not tested)  FUNCTIONAL TESTS:  5 times sit to stand: 19.84 sec Timed up and go (TUG): 19.43 sec with SPC.   08/26/2023 : 286FT  08/30/23: 5x STS 18.17 sec  09/18/2023      5STS: 18.76 sec No UE support      TUG:15.66 sec with SPC  GAIT: Distance walked: 42ft Assistive device utilized: Single point cane Level of assistance: Complete Independence Comments: Pt walks with decreased stride and use of her AD.   TODAY'S TREATMENT:                                                                                                                              DATE:  10/28/23 Pt seen for aquatic therapy today.  Treatment took place in water 3.5-4.75 ft in depth at the [facility] pool. Temp of water was  91.  Pt entered/exited the pool via stairs independently with bilat rail.  * walking forward/ backwards with  reciprocal row with arms x 4 * side stepping with arm addct/ abdct with rainbow hand floats x 2 laps * return to marching forward/ backward with reciprocal arm swing x 3 laps * Holding wall:  leg swings into hip flexion/ hip ext to toe touch x 10; Hip abdct/ addct 2 x 10; single leg clams x 10, heel raises x 10 * TrA set with hollow noodle pull down to thighs x 10 in staggered stance;  single rainbow hand float pull down in standard stace *  marching with knee taps with rainbow hand floats, alternating LEs * straddling noodle with relaxed arms/ breast stroke:   cycling * 3 way LE stretch with ankle on solid noodle, 15s each position, each LE  10/22/23 Pt seen for aquatic therapy today.  Treatment took place in water 3.5-4.75 ft in depth at the [facility] pool. Temp of water was 91.  Pt entered/exited the pool via stairs independently with bilat rail. * seated on bench:  cycling, hip abdct/ addct with DF, alternating LAQ * walking forward/ backwards with  reciprocal row with arms with rainbow hand floats  * side stepping with arm addct/ abdct with rainbow hand floats x 2 laps * Holding wall:  leg swings into hip flexion/ hip ext to toe touch x 10; Hip abdct/ addct x 10; , hip openers/ (knee flexion with hip abdct), single leg clams x 10, * return to walking forward/ backward with reciprocal arm swing  * straddling noodle with relaxed arms/ breast stroke:   cycling   10/18/23:Pt arrives for aquatic physical therapy. Treatment took place in 3.55.5 feet of water. Water temperature was 91 degrees F. Pt entered the pool via stairs, step to step  with heavy use of the rails. Pt requires buoyancy of water for support and to offload joints with strengthening exercises.  Seated water bench with 75% submersion Pt performed seated LE AROM exercises 20x in all planes, concurrent   pain assessment. 75% water walking 10x in each direction holding the single buoy hand wts encouraging some UE push/pull today and picking up her pace. Bil hip kicks 20x in each direction with ankle fins for extra drag, pt requires holding onto pool wall for balance. Standing heel lifts 2x15, Seated hamstring stretch 2x 20 sec Bil. High knee march across pool holding large noodle 6x. Single knee extension pushing blue noodle down 10x Bil.SABRA 8 min underwater bicycle in horseback. Decompression float 10 min for pain/stress management emphasis on low back stretching in 5  ft end.  10/11/23:Pt arrives for aquatic physical therapy. Treatment took place in 3.5-5.5 feet of water. Water temperature was 91 degrees F. Pt entered the pool via stairs, step to step  with heavy use of the rails. Pt requires buoyancy of water for support and to offload joints with strengthening exercises.  Seated water bench with 75% submersion Pt performed seated LE AROM exercises 20x in all planes, concurrent   pain assessment. Vertical decompression hang 5 min with yellow noodle. Seated Lt hip stretch with PTA helping hold LE down due to pt's buoyancy. 3x30 sec. Standing against wall LT single knee to chest 5x 30 sec. Followed by another 6 min vertical decompression hang. PTA suggested pt also try some ice to her buttocks for home. Ice parameters were verbally discussed and verbally understood.   10/04/23:Pt arrives for aquatic physical therapy. Treatment took place  in 3.55.5 feet of water. Water temperature was 91 degrees F. Pt entered the pool via stairs, step to step  with heavy use of the rails. Pt requires buoyancy of water for support and to offload joints with strengthening exercises.  Seated water bench with 75% submersion Pt performed seated LE AROM exercises 20x in all planes, concurrent   pain assessment. 75% water walking 10x in each direction holding the single buoy hand wts encouraging some UE push/pull today and picking up her pace. Bil hip kicks 20x in each direction with ankle fins for extra drag, pt requires holding onto pool wall for balance. Standing heel lifts 2x15, Seated hamstring stretch 2x 20 sec Bil. High knee march across pool holding large noodle 6x. Single knee extension pushing blue noodle down 10x Bil.SABRA 8 min underwater bicycle in horseback. Decompression float 10 min for pain/stress management emphasis on low back stretching in 5 ft end.      PATIENT EDUCATION:  Education details: aquatic therapy exercise progressions/ modifications; issued laminated aquatic HEP   Person educated: Patient Education method: Explanation, hand out Education comprehension: verbalized understanding  HOME EXERCISE PROGRAM: Access Code: [email or web address] URL: [email or web address] Date: 08/01/2023 Prepared by: Rojean Batten  Exercises - Supine Lower Trunk Rotation  - 2 x daily - 7 x weekly - 2 sets - 10 reps - Seated Hamstring Stretch  - 2 x daily - 7 x weekly - 2 sets - 2 reps - 30 hold - Supine Pelvic Tilt  - 1 x daily - 7 x weekly - 3 sets - 10 reps - Supine Transversus Abdominis Bracing - Hands on Stomach  - 1 x daily - 7 x weekly - 2 sets - 10 reps - 10 hold  Aquatic Access Code: [email or web address] URL: [email or web address] Date: 10/28/2023 Prepared by: [facility] [address] [address] This aquatic home exercise program from Med[employer] utilizes pictures from land based exercises, but has been adapted prior to lamination and issuance.    ASSESSMENT:  CLINICAL IMPRESSION: Pt reported gradual reduction of overall pain while exercising in the water. She plans to check out [facility] and join with family. Pt was issued laminated aquatic HEP; verbalized understanding and was able to complete exercises with minimal to no cues.  Pt should be able to complete aquatic HEP independently at local pool at this time.  Pt returns to land for reassessment and updated land HEP.     OBJECTIVE IMPAIRMENTS: decreased activity tolerance, difficulty walking, decreased balance, decreased endurance, decreased mobility, decreased ROM, decreased strength, impaired flexibility, impaired UE/LE use, postural dysfunction, and pain.  ACTIVITY LIMITATIONS: bending, lifting, carry, locomotion, cleaning, community activity, driving, and or occupation  PERSONAL FACTORS: Anxiety, Depression, DDD, Pre diabetes, PTSD, Sleep apnea, hx of breast cancer.  are also affecting patient's functional outcome.  REHAB POTENTIAL: Good  CLINICAL DECISION MAKING: Evolving/moderate  complexity  EVALUATION COMPLEXITY: Moderate    GOALS: Short term PT Goals Target date: 08/15/2023 Pt will be I and compliant with HEP. Baseline:  Goal status:MET 09/18/2023 Pt will decrease pain by 25% overall Baseline:  Goal status: IN PROGRESS 09/18/2023- Patient reports no changes in pain levels  Long term PT goals Target date: 10/30/2023  Pt will improve ROM to [facility] with 4/10 pain to improve functional mobility Baseline: Goal status: IN PROGRESS 09/18/2023  Pt will improve  hip/knee strength to at least 5-/5 MMT to improve functional strength Baseline: Goal status:IN PROGRESS 09/18/2023  Pt will improve ODI to at least 15 points functional to show  improved function Baseline: Goal status: IN PROGRESS 09/18/2023 - 25/50 50%  Pt will reduce pain by overall 50% overall with usual activity Baseline: Goal status: IN PROGRESS 09/18/2023- Patient reports no changes in pain levels  Pt will decrease TUG to below 14 seconds in order to demonstrate decreased fall risk Baseline: Goal status: IN PROGRESS 09/18/2023 - 15.66sec  Pt will decrease STS by  3.6 seconds in order to demonstrate decreased fall risk. - in progress, 08/30/23 Baseline: Goal status: IN PROGRESS 09/18/2023 - 18.76  Pt will be able to ambulate community distances at least 1000 ft with AD and gait pattern without complaints Baseline: Goal status: IN PROGRESS 09/18/2023 -  PLAN: PT FREQUENCY: 1-2 times per week   PT DURATION: 6 weeks  PLANNED INTERVENTIONS (unless contraindicated): aquatic PT, Canalith repositioning, cryotherapy, Electrical stimulation, Iontophoresis with 4 mg/ml dexamethasome, Moist heat, traction, Ultrasound, gait training, Therapeutic exercise, balance training, neuromuscular re-education, patient/family education, prosthetic training, manual techniques, passive ROM, dry needling, taping, vasopnuematic device, vestibular, spinal manipulations, joint manipulations  PLAN FOR NEXT SESSION: end of POC;  PT to reassess.     Delon Aquas, PTA 10/28/23 12:49 PM [facility] [address] [place], [place], [address] Phone: [phone]   Fax:  [phone]

## 2023-10-30 ENCOUNTER — Encounter: Payer: Self-pay | Admitting: Physical Therapy

## 2023-10-30 ENCOUNTER — Ambulatory Visit: Payer: 59 | Admitting: Physical Therapy

## 2023-10-30 DIAGNOSIS — M6281 Muscle weakness (generalized): Secondary | ICD-10-CM

## 2023-10-30 DIAGNOSIS — R293 Abnormal posture: Secondary | ICD-10-CM

## 2023-10-30 DIAGNOSIS — R262 Difficulty in walking, not elsewhere classified: Secondary | ICD-10-CM

## 2023-10-30 DIAGNOSIS — M5459 Other low back pain: Secondary | ICD-10-CM | POA: Diagnosis not present

## 2023-10-30 NOTE — Therapy (Signed)
 OUTPATIENT PHYSICAL THERAPY THORACOLUMBAR TREATMENT NOTE / DISCHARGE NOTE   Patient Name: Rosela Domina MRN: [id number] DOB:Jul 28, 1961, 63 y.o., female Today's Date: 10/30/2023   END OF SESSION:  PT End of Session - 10/30/23 1720     Visit Number 20    Date for PT Re-Evaluation 10/30/23    Authorization Type [employer] DUAL COMPLETE    Progress Note Due on Visit 20    PT Start Time 1448    PT Stop Time 1531    PT Time Calculation (min) 43 min    Activity Tolerance Patient tolerated treatment well    Behavior During Therapy [facility] for tasks assessed/performed               Past Medical History:  Diagnosis Date   Anxiety    Chronic pain    Depression    Family history of breast cancer 08/23/2022   H/O degenerative disc disease    History of radiation therapy    Left breast- 10/25/22-12/11/22- Dr. Retta Caster   History of radiation therapy    Left breast-10/25/22-12/11/22- Dr. Retta Caster   Pre-diabetes    PTSD (post-traumatic stress disorder)    Sleep apnea    Past Surgical History:  Procedure Laterality Date   ABDOMINAL HYSTERECTOMY     BREAST LUMPECTOMY WITH SENTINEL LYMPH NODE BIOPSY Left 09/14/2022   Procedure: LEFT BREAST CENTRAL LUMPECTOMY WITH SENTINEL LYMPH NODE BX;  Surgeon: Caralyn Chandler, MD;  Location: [facility];  Service: General;  Laterality: Left;   COLONOSCOPY WITH PROPOFOL  N/A 03/15/2016   Procedure: COLONOSCOPY WITH PROPOFOL ;  Surgeon: Delilah Fend, MD;  Location: [facility];  Service: Endoscopy;  Laterality: N/A;   NECK SURGERY     Patient Active Problem List   Diagnosis Date Noted   Genetic testing 08/27/2022   Family history of breast cancer 08/23/2022   Malignant neoplasm of central portion of left breast in female, estrogen receptor positive (HCC) 08/20/2022   Chronic pain of left knee 06/03/2015   Depression, major, recurrent, moderate (HCC) 06/03/2015   Morbid obesity with body mass index of 50 or higher (HCC)  06/03/2015   Vitamin D deficiency 06/03/2015   PTSD (post-traumatic stress disorder) 06/03/2015   Obstructive sleep apnea 06/03/2015    REFERRING PROVIDER: Earlis Glimpse, NP   REFERRING DIAG: Lumbago with sciatica, unspecified side [M54.40]   Rationale for Evaluation and Treatment: Rehabilitation  THERAPY DIAG:  Other low back pain  Muscle weakness (generalized)  Abnormal posture  Difficulty in walking, not elsewhere classified  ONSET DATE: Several years.   SUBJECTIVE:  SUBJECTIVE STATEMENT:   Patient reports she is doing okay today. She has been wearing a knee brace on her Rt knee and she has noted some improvements.     She plans to look into the [facility] and attending with her granddaughter. She is going to try to get there this week.   PERTINENT HISTORY:  Anxiety, Depression, DDD, Pre diabetes, PTSD, Sleep apnea, hx of breast cancer.   PAIN: 10/30/2023 Are you having pain? 8/10 Better:  laying down, pain relievers, knee braces Worse: walking  PRECAUTIONS: None  RED FLAGS: None   WEIGHT BEARING RESTRICTIONS: No  FALLS:  Has patient fallen in last 6 months? 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  10/24:  I want to be able to travel to [place]  with my friend  OBJECTIVE:  Note: Objective measures were completed at Evaluation unless otherwise noted.  DIAGNOSTIC FINDINGS:  None in chart. [facility] per pt.   PATIENT SURVEYS:  ODI: 29/50 58%  09/18/2023: ODI 25/50  50%  SCREENING FOR RED FLAGS: Bowel or bladder incontinence: Yes: incontinence   COGNITION: Overall cognitive status: Within functional limits for tasks assessed     SENSATION: WFL  POSTURE: rounded shoulders and decreased lumbar lordosis  PALPATION: No tenderness to   LUMBAR ROM:   Overall ROM is WFL with pt reporting pain with movement. Hip hinge with lumbar flexion.   LOWER EXTREMITY ROM:     Overall ROM is WFL with pt reporting pain with movement.   LOWER EXTREMITY MMT:    MMT Right eval Left eval  Hip flexion 4 4+  Knee flexion 4+ 4+  Knee extension 4+ 4+   (Blank rows = not tested)  FUNCTIONAL TESTS:  5 times sit to stand: 19.84 sec Timed up and go (TUG): 19.43 sec with SPC.   08/26/2023 : 286FT  08/30/23: 5x STS 18.17 sec  09/18/2023      5STS: 18.76 sec No UE support      TUG:15.66 sec with SPC  GAIT: Distance walked: 54ft Assistive device utilized: Single point cane Level of assistance: Complete Independence Comments: Pt walks with decreased stride and use of her AD.   TODAY'S TREATMENT:                                                                                                                              DATE:  10/30/2023 5 STS: 23.20 sec TUG: 18.79 with straight cane ODI:50% Seated hip flexion x 20 Seated hip abduction x 20 with red TB Seated lumbar flexion stretch 2 x 20 sec Seated lumbar extension x 8 Seated scap squeeze x 5 MMT Assessment Discussion on staying active &  pain management    10/28/23 Pt seen for aquatic therapy today.  Treatment took place in water 3.5-4.75 ft in depth at the [facility] pool. Temp of water was 91.  Pt entered/exited the pool via stairs independently with bilat rail. * walking forward/ backwards with  reciprocal row with arms x 4 * side stepping with arm addct/ abdct with rainbow hand floats x 2 laps * return to marching forward/ backward with reciprocal arm swing x 3 laps * Holding  wall:  leg swings into hip flexion/ hip ext to toe touch x 10; Hip abdct/ addct 2 x 10; single leg clams x 10, heel raises x 10 * TrA set with hollow noodle pull down to thighs x 10 in staggered stance;  single rainbow hand float pull down in standard stace * marching with knee taps with rainbow hand floats, alternating LEs * straddling noodle with relaxed arms/ breast stroke:   cycling * 3 way LE stretch with ankle on solid noodle, 15s each position, each LE  10/22/23 Pt seen for aquatic therapy today.  Treatment took place in water 3.5-4.75 ft in depth at the [facility] pool. Temp of water was 91.  Pt entered/exited the pool via stairs independently with bilat rail. * seated on bench:  cycling, hip abdct/ addct with DF, alternating LAQ * walking forward/ backwards with  reciprocal row with arms with rainbow hand floats  * side stepping with arm addct/ abdct with rainbow hand floats x 2 laps * Holding wall:  leg swings into hip flexion/ hip ext to toe touch x 10; Hip abdct/ addct x 10; , hip openers/ (knee flexion with hip abdct), single leg clams x 10, * return to walking forward/ backward with reciprocal arm swing  * straddling noodle with relaxed arms/ breast stroke:   cycling   PATIENT EDUCATION:  Education details: aquatic therapy exercise progressions/ modifications; issued laminated aquatic HEP  Person educated: Patient Education method: Explanation, hand out Education comprehension: verbalized understanding  HOME EXERCISE PROGRAM: Access Code: [email or web address] URL: [email or web address] Date: 10/30/2023 Prepared by: Penelope Bowie  Exercises - Supine Lower Trunk Rotation  - 2 x daily - 7 x weekly - 2 sets - 10 reps - Seated Hamstring Stretch  - 2 x daily - 7 x weekly - 2 sets - 2 reps - 30 hold - Supine Pelvic Tilt  - 1 x daily - 7 x weekly - 3 sets - 10 reps - Supine Transversus Abdominis Bracing - Hands on Stomach  - 1 x daily - 7 x weekly - 2 sets - 10 reps -  10 hold - Seated March  - 1 x daily - 7 x weekly - 2 sets - 10 reps - Seated Hip Abduction with Resistance  - 1 x daily - 7 x weekly - 2 sets - 10 reps - Seated Lumbar Flexion Stretch  - 1 x daily - 7 x weekly - 2 sets - 20 hold - Seated Thoracic Lumbar Extension with Pectoralis Stretch  - 1 x daily - 7 x weekly - 1 sets - 10 reps - Sit to Stand  - 1 x daily - 7 x weekly - 1 sets - 10 reps - Seated Scapular Retraction  - 1 x daily - 7 x weekly - 2 sets - 10 reps  Aquatic Access Code: [email or web address] URL: [email or web address] Date: 10/28/2023 Prepared by: [facility] [address] This aquatic home exercise program from MedBridge utilizes pictures from land based exercises, but  has been adapted prior to lamination and issuance.    ASSESSMENT:  CLINICAL IMPRESSION: Latashia has made moderate improvements with physical therapy. Patient's extensive medical history has been a barrier to the amount of progress she has made. She still experiences high pain levels, but some days are better than others. She responded well to aquatic therapy.  Patient plans to continue aquatic exercise at her local [employer] with the exercises her therapist provided for her. Educated patient on pain management techniques, and updated her HEP to include general strengthening exercises. Patient partially met her goals. Patient to discharge home with HEP.   OBJECTIVE IMPAIRMENTS: decreased activity tolerance, difficulty walking, decreased balance, decreased endurance, decreased mobility, decreased ROM, decreased strength, impaired flexibility, impaired UE/LE use, postural dysfunction, and pain.  ACTIVITY LIMITATIONS: bending, lifting, carry, locomotion, cleaning, community activity, driving, and or occupation  PERSONAL FACTORS: Anxiety, Depression, DDD, Pre diabetes, PTSD, Sleep apnea, hx of breast cancer.  are also affecting patient's functional outcome.  REHAB POTENTIAL: Good  CLINICAL DECISION  MAKING: Evolving/moderate complexity  EVALUATION COMPLEXITY: Moderate    GOALS: Short term PT Goals Target date: 08/15/2023 Pt will be I and compliant with HEP. Baseline:  Goal status:MET 09/18/2023 Pt will decrease pain by 25% overall Baseline:  Goal status:NOT MET 10/30/2023  Long term PT goals Target date: 10/30/2023  Pt will improve ROM to [facility] with 4/10 pain to improve functional mobility Baseline: Goal status:  MET 10/30/2023  Pt will improve  hip/knee strength to at least 5-/5 MMT to improve functional strength Baseline: Goal status: NOT MET 10/30/2023  Pt will improve ODI to at least 15 points functional to show improved function Baseline: Goal status: NOT MET 10/30/2023  Pt will reduce pain by overall 50% overall with usual activity Baseline: Goal status: NOT MET 10/30/2023  Pt will decrease TUG to below 14 seconds in order to demonstrate decreased fall risk Baseline: Goal status: NOT MET 10/30/2023  Pt will decrease STS by  3.6 seconds in order to demonstrate decreased fall risk. - in progress, 08/30/23 Baseline: Goal status: NOT MET 10/30/2023  Pt will be able to ambulate community distances at least 1000 ft with AD and gait pattern without complaints Baseline: Goal status: Partially met- she can walk the distances but she is in increased pain afterwards  PLAN: PT FREQUENCY: 1-2 times per week   PT DURATION: 6 weeks  PLANNED INTERVENTIONS (unless contraindicated): aquatic PT, Canalith repositioning, cryotherapy, Electrical stimulation, Iontophoresis with 4 mg/ml dexamethasome, Moist heat, traction, Ultrasound, gait training, Therapeutic exercise, balance training, neuromuscular re-education, patient/family education, prosthetic training, manual techniques, passive ROM, dry needling, taping, vasopnuematic device, vestibular, spinal manipulations, joint manipulations  PLAN FOR NEXT SESSION:   PHYSICAL THERAPY DISCHARGE SUMMARY  Visits from Start of Care:  20  Current functional level related to goals / functional outcomes: See above   Remaining deficits: Increased pain levels; decreased walking tolerance   Education / Equipment: See HEP above   Patient agrees to discharge. Patient goals were partially met. Patient is being discharged due to maximized rehab potential.   Penelope Bowie, PT 10/30/23 5:21 PM

## 2023-10-31 ENCOUNTER — Other Ambulatory Visit: Payer: Self-pay

## 2023-11-07 ENCOUNTER — Other Ambulatory Visit: Payer: Self-pay

## 2023-11-07 NOTE — Progress Notes (Signed)
Specialty Pharmacy Refill Coordination Note  Kelli Estrada is a 63 y.o. female contacted today regarding refills of specialty medication(s) Abemaciclib [name])   Patient requested Delivery   Delivery date: 11/08/23   Verified address: [address] [place] [address]   Medication will be filled on 11/07/23.

## 2023-11-25 ENCOUNTER — Ambulatory Visit: Payer: 59 | Attending: Radiology

## 2023-11-25 VITALS — Wt 263.5 lb

## 2023-11-25 DIAGNOSIS — Z483 Aftercare following surgery for neoplasm: Secondary | ICD-10-CM

## 2023-11-25 NOTE — Therapy (Signed)
 OUTPATIENT PHYSICAL THERAPY SOZO SCREENING NOTE   Patient Name: Kelli Estrada MRN: [id number] DOB:[birth year], 63 y.o., female Today's Date: 11/25/2023  PCP: Janese Medicine., MD REFERRING PROVIDER: Pearlene Bouchard, PA-C   PT End of Session - 11/25/23 1515     Visit Number 20   # unchanged due to screen only   PT Start Time 1511    PT Stop Time 1517    PT Time Calculation (min) 6 min    Activity Tolerance Patient tolerated treatment well    Behavior During Therapy [facility] for tasks assessed/performed             Past Medical History:  Diagnosis Date   Anxiety    Chronic pain    Depression    Family history of breast cancer 08/23/2022   H/O degenerative disc disease    History of radiation therapy    Left breast- 10/25/22-12/11/22- Dr. Retta Caster   History of radiation therapy    Left breast-10/25/22-12/11/22- Dr. Retta Caster   Pre-diabetes    PTSD (post-traumatic stress disorder)    Sleep apnea    Past Surgical History:  Procedure Laterality Date   ABDOMINAL HYSTERECTOMY     BREAST LUMPECTOMY WITH SENTINEL LYMPH NODE BIOPSY Left 09/14/2022   Procedure: LEFT BREAST CENTRAL LUMPECTOMY WITH SENTINEL LYMPH NODE BX;  Surgeon: Caralyn Chandler, MD;  Location: [facility];  Service: General;  Laterality: Left;   COLONOSCOPY WITH PROPOFOL  N/A 03/15/2016   Procedure: COLONOSCOPY WITH PROPOFOL ;  Surgeon: Delilah Fend, MD;  Location: [facility];  Service: Endoscopy;  Laterality: N/A;   NECK SURGERY     Patient Active Problem List   Diagnosis Date Noted   Genetic testing 08/27/2022   Family history of breast cancer 08/23/2022   Malignant neoplasm of central portion of left breast in female, estrogen receptor positive (HCC) 08/20/2022   Chronic pain of left knee 06/03/2015   Depression, major, recurrent, moderate (HCC) 06/03/2015   Morbid obesity with body mass index of 50 or higher (HCC) 06/03/2015   Vitamin D deficiency 06/03/2015   PTSD (post-traumatic stress  disorder) 06/03/2015   Obstructive sleep apnea 06/03/2015    REFERRING DIAG: left breast cancer at risk for lymphedema  THERAPY DIAG: Aftercare following surgery for neoplasm  PERTINENT HISTORY: Patient was diagnosed on 07/18/2022 with left grade 2 invasive ductal carcinoma breast cancer. It measures 1.9 cm and is located in the central quadrant. It is ER/PR positive and HER2 negative with a Ki67 of 10%. 09/14/22 L breast lumpectomy and SLNB 1/3. Completed radiation. Hx of chronic pain, degenerative disc disease, PTSD   PRECAUTIONS: left UE Lymphedema risk, None  SUBJECTIVE: Pt returns for her 3 month L-Dex screen.   PAIN:  Are you having pain? No  SOZO SCREENING: Patient was assessed today using the SOZO machine to determine the lymphedema index score. This was compared to her baseline score. It was determined that she is within the recommended range when compared to her baseline and no further action is needed at this time. She will continue SOZO screenings. These are done every 3 months for 2 years post operatively followed by every 6 months for 2 years, and then annually.   L-DEX FLOWSHEETS - 11/25/23 1500       L-DEX LYMPHEDEMA SCREENING   Measurement Type Unilateral    L-DEX MEASUREMENT EXTREMITY Upper Extremity    POSITION  Standing    DOMINANT SIDE Right    At Risk Side Left  BASELINE SCORE (UNILATERAL) 12.5    L-DEX SCORE (UNILATERAL) 4.2    VALUE CHANGE (UNILAT) -8.3               Denyce Flank, PTA 11/25/2023, 3:16 PM

## 2023-12-02 ENCOUNTER — Other Ambulatory Visit: Payer: Self-pay

## 2023-12-04 ENCOUNTER — Other Ambulatory Visit (HOSPITAL_COMMUNITY): Payer: Self-pay

## 2023-12-05 ENCOUNTER — Other Ambulatory Visit (HOSPITAL_COMMUNITY): Payer: Self-pay

## 2023-12-05 ENCOUNTER — Other Ambulatory Visit: Payer: Self-pay

## 2023-12-05 ENCOUNTER — Other Ambulatory Visit: Payer: Self-pay | Admitting: Hematology and Oncology

## 2023-12-05 MED ORDER — ABEMACICLIB 100 MG PO TABS
100.0000 mg | ORAL_TABLET | Freq: Two times a day (BID) | ORAL | 1 refills | Status: DC
  Filled 2023-12-05: qty 56, 28d supply, fill #0
  Filled 2024-01-03: qty 56, 28d supply, fill #1

## 2023-12-05 NOTE — Progress Notes (Signed)
Specialty Pharmacy Refill Coordination Note  Kelli Estrada is a 63 y.o. female contacted today regarding refills of specialty medication(s) No data recorded  Patient requested (Patient-Rptd) Delivery   Delivery date: (Patient-Rptd) 12/11/23   Verified address: (Patient-Rptd) [address] [place], [place] [address]   Medication will be filled on 12/10/23.   This fill date is pending response to refill request from provider. Patient is aware and if they have not received fill by intended date, they must follow up with pharmacy.

## 2023-12-20 ENCOUNTER — Other Ambulatory Visit: Payer: Self-pay

## 2023-12-20 ENCOUNTER — Encounter (HOSPITAL_COMMUNITY): Payer: Self-pay | Admitting: Emergency Medicine

## 2023-12-20 ENCOUNTER — Emergency Department (HOSPITAL_COMMUNITY)

## 2023-12-20 ENCOUNTER — Emergency Department (HOSPITAL_COMMUNITY)
Admission: EM | Admit: 2023-12-20 | Discharge: 2023-12-20 | Disposition: A | Discharge status: Home / Self Care | Attending: Emergency Medicine | Admitting: Emergency Medicine

## 2023-12-20 DIAGNOSIS — H9201 Otalgia, right ear: Secondary | ICD-10-CM | POA: Diagnosis present

## 2023-12-20 DIAGNOSIS — X58XXXA Exposure to other specified factors, initial encounter: Secondary | ICD-10-CM | POA: Diagnosis not present

## 2023-12-20 DIAGNOSIS — S86912A Strain of unspecified muscle(s) and tendon(s) at lower leg level, left leg, initial encounter: Secondary | ICD-10-CM | POA: Diagnosis not present

## 2023-12-20 DIAGNOSIS — H60501 Unspecified acute noninfective otitis externa, right ear: Secondary | ICD-10-CM | POA: Diagnosis not present

## 2023-12-20 MED ORDER — OFLOXACIN 0.3 % OT SOLN: 5.0000 [drp] | Freq: Two times a day (BID) | OTIC | 0 refills | Status: DC

## 2023-12-20 MED ORDER — OFLOXACIN 0.3 % OT SOLN: 5.0000 [drp] | Freq: Two times a day (BID) | OTIC | 0 refills | Status: AC

## 2023-12-20 MED ORDER — BACITRACIN ZINC 500 UNIT/GM EX OINT
TOPICAL_OINTMENT | Freq: Two times a day (BID) | CUTANEOUS | Status: DC
  Administered 2023-12-20: 1 via TOPICAL
  Filled 2023-12-20: qty 0.9

## 2023-12-20 MED ORDER — NAPROXEN 500 MG PO TABS
500.0000 mg | ORAL_TABLET | Freq: Two times a day (BID) | ORAL | 0 refills | Status: DC
Start: 2023-12-20 — End: 2024-02-10

## 2023-12-20 NOTE — ED Triage Notes (Signed)
 Pt reports she has a sore or something on the inside of the front of there right ear. Pt reports no one has been able to see anything. RN in triage was unable to see anything. PT reports she feels the pain is going down into her ear and messing with her balance. Pt is also expressing some anxiety in triage. Pt has been prescribed ear drops.

## 2023-12-20 NOTE — Discharge Instructions (Addendum)
 You have a sore on the inside of your ear and because you used hydrogen peroxide it is damage the tissue and given you a ear infection.  Use the drops as I prescribed and call the doctor's office above for a follow-up with an ear nose and throat doctor if things or not getting any better.  Keep a topical antibiotic on the wound of your knee, there is no broken bones.  Tylenol or ibuprofen as needed for pain  Please take Naprosyn, 500mg  by mouth twice daily as needed for pain - this in an antiinflammatory medicine (NSAID) and is similar to ibuprofen - many people feel that it is stronger than ibuprofen and it is easier to take since it is a smaller pill.  Please use this only for 1 week - if your pain persists, you will need to follow up with your doctor in the office for ongoing guidance and pain control.

## 2023-12-20 NOTE — ED Provider Notes (Signed)
 [place] EMERGENCY DEPARTMENT AT [facility] Provider Note   CSN: [id number] Arrival date & time: 12/20/23  1514     History  Chief Complaint  Patient presents with   Otalgia    Kelli Estrada is a 63 y.o. female.   Otalgia  This patient is a 63 year old female presenting with right-sided ear pain which has been going on for a couple of weeks, she has seen her doctor who has told her she needs to see an ear nose and throat doctor, she has not been referred, she tried to pour hydrogen peroxide in her ear because of the wound, since that time she has had more pain and swelling of the ear and it hurts to touch it.  She had a fall because she was dizzy when she stood up and landed on her left knee, she does not feel dizzy at this time    Home Medications Prior to Admission medications   Medication Sig Start Date End Date Taking? Authorizing Provider  naproxen (NAPROSYN) 500 MG tablet Take 1 tablet (500 mg total) by mouth 2 (two) times daily with a meal. 12/20/23  Yes Eber Hong, MD  ofloxacin (FLOXIN) 0.3 % OTIC solution Place 5 drops into the right ear 2 (two) times daily. 12/20/23  Yes Eber Hong, MD  ofloxacin (FLOXIN) 0.3 % OTIC solution Place 5 drops into the right ear 2 (two) times daily. 12/20/23  Yes Eber Hong, MD  abemaciclib (VERZENIO) 100 MG tablet Take 1 tablet (100 mg total) by mouth 2 (two) times daily. 12/05/23   Rachel Moulds, MD  albuterol (VENTOLIN HFA) 108 (90 Base) MCG/ACT inhaler SMARTSIG:1 Puff(s) Via Inhaler 4 Times Daily PRN 04/06/21   [provider]  alendronate (FOSAMAX) 70 MG tablet Take 70 mg by mouth once a week. 06/28/21   [provider]  anastrozole (ARIMIDEX) 1 MG tablet Take 1 tablet (1 mg total) by mouth daily. 12/31/22   Rachel Moulds, MD  Blood Glucose Monitoring Suppl (ONETOUCH VERIO REFLECT) w/Device KIT See admin instructions. 04/03/21   [provider]  Cholecalciferol (VITAMIN D3) 250 MCG (10000 UT) capsule  Take 10,000 Units by mouth daily.    [provider]  CVS VITAMIN E 180 MG (400 UNIT) CAPS Take 2 capsules by mouth daily. 04/08/21   [provider]  fenofibrate (TRICOR) 48 MG tablet Take 40 mg by mouth daily.    [provider]  Fluticasone-Umeclidin-Vilant (TRELEGY ELLIPTA) 100-62.5-25 MCG/ACT AEPB Inhale 100 mcg into the lungs 1 day or 1 dose.    [provider]  gabapentin (NEURONTIN) 300 MG capsule Take 300 mg by mouth 3 (three) times daily. 02/24/16   [provider]  Ginger 500 MG CAPS Take by mouth.    [provider]  hydrOXYzine (VISTARIL) 25 MG capsule Take 25 mg by mouth 2 (two) times daily as needed for anxiety.  04/16/18   [provider]  loperamide (IMODIUM) 2 MG capsule Take 2 mg by mouth as needed for diarrhea or loose stools.    [provider]  mirabegron ER (MYRBETRIQ) 25 MG TB24 tablet Take 1 tablet (25 mg total) by mouth daily. 08/07/23   McKenzie, Mardene Celeste, MD  Misc Natural Products (OSTEO BI-FLEX JOINT SHIELD PO) Take by mouth daily.    [provider]  omeprazole (PRILOSEC OTC) 20 MG tablet Take 20 mg by mouth daily.    [provider]  ondansetron (ZOFRAN) 8 MG tablet Take 1 tablet (8 mg total)  by mouth every 8 (eight) hours as needed for nausea or vomiting. 02/12/23   Rachel Moulds, MD  Oxcarbazepine (TRILEPTAL) 300 MG tablet Take 300 mg by mouth 2 (two) times daily. 04/03/15   [provider]  oxyCODONE-acetaminophen (PERCOCET/ROXICET) 5-325 MG tablet Take 1 tablet by mouth every 6 (six) hours as needed (For pain.).  06/03/15   [provider]  OZEMPIC, 1 MG/DOSE, 4 MG/3ML SOPN Inject 1 mg into the skin once a week. 03/10/21   [provider]  prochlorperazine (COMPAZINE) 10 MG tablet Take 1 tablet (10 mg total) by mouth every 6 (six) hours as needed for nausea or vomiting. 02/12/23   Rachel Moulds, MD  rosuvastatin (CRESTOR) 20 MG tablet Take 20 mg by mouth  at bedtime. 07/18/21   [provider]  sertraline (ZOLOFT) 100 MG tablet Take 100 mg by mouth 2 (two) times daily. 02/21/16   [provider]  tamsulosin (FLOMAX) 0.4 MG CAPS capsule Take 1 capsule (0.4 mg total) by mouth daily. 07/08/23   McKenzie, Mardene Celeste, MD  topiramate (TOPAMAX) 100 MG tablet Take 100 mg by mouth daily. 07/16/21   [provider]      Allergies    Patient has no known allergies.    Review of Systems   Review of Systems  HENT:  Positive for ear pain.   All other systems reviewed and are negative.   Physical Exam Updated Vital Signs BP 123/74   Pulse 78   Temp 98.9 F (37.2 C) (Oral)   Resp 18   Ht 1.6 m (5\' 3" )   Wt 122.9 kg   SpO2 99%   BMI 48.01 kg/m  Physical Exam Vitals and nursing note reviewed.  Constitutional:      General: She is not in acute distress.    Appearance: She is well-developed.  HENT:     Head: Normocephalic and atraumatic.     Ears:     Comments: Left tympanic membrane visualized, right tympanic membrane cannot be visualized, there is edema of the external auditory canal, it is not completely swollen and does not need an ear wick.  There is tenderness with manipulation of the ear, there is a small sore right behind the tragus    Mouth/Throat:     Pharynx: No oropharyngeal exudate.  Eyes:     General: No scleral icterus.       Right eye: No discharge.        Left eye: No discharge.     Conjunctiva/sclera: Conjunctivae normal.     Pupils: Pupils are equal, round, and reactive to light.  Neck:     Thyroid: No thyromegaly.     Vascular: No JVD.  Cardiovascular:     Rate and Rhythm: Normal rate and regular rhythm.     Heart sounds: Normal heart sounds. No murmur heard.    No friction rub. No gallop.  Pulmonary:     Effort: Pulmonary effort is normal. No respiratory distress.     Breath sounds: Normal breath sounds. No wheezing or rales.  Abdominal:     General: Bowel sounds are normal. There is no  distension.     Palpations: Abdomen is soft. There is no mass.     Tenderness: There is no abdominal tenderness.  Musculoskeletal:        General: Tenderness present. Normal range of motion.     Cervical back: Normal range of motion and neck supple.     Right lower leg: No edema.  Left lower leg: No edema.     Comments: Straight leg raise intact on the left, there is an abrasion over the left knee, there is mild tenderness with range of motion but has the ability to flex to 90 degrees.  Lymphadenopathy:     Cervical: No cervical adenopathy.  Skin:    General: Skin is warm and dry.     Findings: No erythema or rash.  Neurological:     Mental Status: She is alert.     Coordination: Coordination normal.  Psychiatric:        Behavior: Behavior normal.     ED Results / Procedures / Treatments   Labs (all labs ordered are listed, but only abnormal results are displayed) Labs Reviewed - No data to display  EKG None  Radiology No results found.  Procedures Procedures    Medications Ordered in ED Medications  bacitracin ointment (1 Application Topical Given 12/20/23 2304)    ED Course/ Medical Decision Making/ A&P                                 Medical Decision Making Amount and/or Complexity of Data Reviewed Radiology: ordered.  Risk OTC drugs. Prescription drug management.   Rule out fracture of the with x-ray Topical drops for the ear to treat otitis externa Patient can follow-up outpatient, no other acute findings, vitals unremarkable, patient agreeable  I personally viewed and interpreted the x-ray of the left knee, there is no signs of broken bones, no fractures or dislocations, no obvious joint effusion.  Patient informed of results, home with anti-inflammatory and topical drop for her ear        Final Clinical Impression(s) / ED Diagnoses Final diagnoses:  Acute otitis externa of right ear, unspecified type  Knee strain, left, initial encounter     Rx / DC Orders ED Discharge Orders          Ordered    ofloxacin (FLOXIN) 0.3 % OTIC solution  2 times daily        12/20/23 2034    ofloxacin (FLOXIN) 0.3 % OTIC solution  2 times daily        12/20/23 2312    naproxen (NAPROSYN) 500 MG tablet  2 times daily with meals        12/20/23 2312              Eber Hong, MD 12/20/23 2313

## 2023-12-23 ENCOUNTER — Inpatient Hospital Stay: Payer: 59 | Attending: Hematology and Oncology

## 2023-12-23 ENCOUNTER — Inpatient Hospital Stay (HOSPITAL_BASED_OUTPATIENT_CLINIC_OR_DEPARTMENT_OTHER): Payer: 59 | Admitting: Hematology and Oncology

## 2023-12-23 VITALS — BP 146/96 | HR 93 | Temp 98.0°F | Resp 17 | Wt 264.6 lb

## 2023-12-23 DIAGNOSIS — Z1721 Progesterone receptor positive status: Secondary | ICD-10-CM | POA: Insufficient documentation

## 2023-12-23 DIAGNOSIS — C50112 Malignant neoplasm of central portion of left female breast: Secondary | ICD-10-CM | POA: Insufficient documentation

## 2023-12-23 DIAGNOSIS — Z79811 Long term (current) use of aromatase inhibitors: Secondary | ICD-10-CM | POA: Insufficient documentation

## 2023-12-23 DIAGNOSIS — Z170 Estrogen receptor positive status [ER+]: Secondary | ICD-10-CM | POA: Insufficient documentation

## 2023-12-23 DIAGNOSIS — M25562 Pain in left knee: Secondary | ICD-10-CM | POA: Insufficient documentation

## 2023-12-23 DIAGNOSIS — Z1732 Human epidermal growth factor receptor 2 negative status: Secondary | ICD-10-CM | POA: Insufficient documentation

## 2023-12-23 DIAGNOSIS — H9201 Otalgia, right ear: Secondary | ICD-10-CM | POA: Diagnosis not present

## 2023-12-23 DIAGNOSIS — M8589 Other specified disorders of bone density and structure, multiple sites: Secondary | ICD-10-CM | POA: Insufficient documentation

## 2023-12-23 DIAGNOSIS — Z923 Personal history of irradiation: Secondary | ICD-10-CM | POA: Diagnosis not present

## 2023-12-23 LAB — CMP (CANCER CENTER ONLY)
ALT: 15 U/L (ref 0–44)
AST: 14 U/L — ABNORMAL LOW (ref 15–41)
Albumin: 4.2 g/dL (ref 3.5–5.0)
Alkaline Phosphatase: 66 U/L (ref 38–126)
Anion gap: 8 (ref 5–15)
BUN: 18 mg/dL (ref 8–23)
CO2: 27 mmol/L (ref 22–32)
Calcium: 8.8 mg/dL — ABNORMAL LOW (ref 8.9–10.3)
Chloride: 104 mmol/L (ref 98–111)
Creatinine: 0.88 mg/dL (ref 0.44–1.00)
GFR, Estimated: >60 mL/min (ref >60)
Glucose, Bld: 140 mg/dL — ABNORMAL HIGH (ref 70–99)
Potassium: 3.8 mmol/L (ref 3.5–5.1)
Sodium: 139 mmol/L (ref 135–145)
Total Bilirubin: 0.4 mg/dL (ref 0.0–1.2)
Total Protein: 6.9 g/dL (ref 6.5–8.1)

## 2023-12-23 LAB — CBC WITH DIFFERENTIAL (CANCER CENTER ONLY)
Abs Immature Granulocytes: 0.02 10*3/uL (ref 0.00–0.07)
Basophils Absolute: 0 10*3/uL (ref 0.0–0.1)
Basophils Relative: 1 %
Eosinophils Absolute: 0.1 10*3/uL (ref 0.0–0.5)
Eosinophils Relative: 2 %
HCT: 40.1 % (ref 36.0–46.0)
Hemoglobin: 12.9 g/dL (ref 12.0–15.0)
Immature Granulocytes: 0 %
Lymphocytes Relative: 20 %
Lymphs Abs: 1.1 10*3/uL (ref 0.7–4.0)
MCH: 28.7 pg (ref 26.0–34.0)
MCHC: 32.2 g/dL (ref 30.0–36.0)
MCV: 89.1 fL (ref 80.0–100.0)
Monocytes Absolute: 0.2 10*3/uL (ref 0.1–1.0)
Monocytes Relative: 4 %
Neutro Abs: 4 10*3/uL (ref 1.7–7.7)
Neutrophils Relative %: 73 %
Platelet Count: 168 10*3/uL (ref 150–400)
RBC: 4.5 MIL/uL (ref 3.87–5.11)
RDW: 13.9 % (ref 11.5–15.5)
WBC Count: 5.5 10*3/uL (ref 4.0–10.5)
nRBC: 0 % (ref 0.0–0.2)

## 2023-12-23 NOTE — Progress Notes (Signed)
 Patient Care Team: Frederich Chick., MD as PCP - General (Family Medicine) Rachel Moulds, MD as Consulting Physician (Hematology and Oncology) Griselda Miner, MD as Consulting Physician (General Surgery) Antony Blackbird, MD as Consulting Physician (Radiation Oncology) Anselm Lis, RPH-CPP as Pharmacist (Hematology and Oncology)  SUMMARY OF ONCOLOGIC HISTORY: Oncology History  Malignant neoplasm of central portion of left breast in female, estrogen receptor positive (HCC)  07/18/2022 Imaging   Mammogram showed indeterminate left breast mass central to the nipple in the retroareolar region, indeterminate lymph node.  Ultrasound done showed 1.9 x 1.4 x 1.5 cm taller than wide irregular mass in the left breast highly suggestive of malignancy.  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  Report date is 08/29/2022.   The CustomNext-Cancer+RNAinsight panel offered by [employer] includes sequencing and rearrangement analysis for the following 47 genes:  APC, ATM, AXIN2, BARD1, BMPR1A, BRCA1, BRCA2, BRIP1, CDH1, CDK4, CDKN2A, CHEK2, DICER1, EPCAM, GREM1, HOXB13, MEN1, MLH1, MSH2, MSH3, MSH6, MUTYH, NBN, NF1, NF2, NTHL1, PALB2, PMS2, POLD1, POLE, PTEN, RAD51C, RAD51D, RECQL, RET, SDHA, SDHAF2, SDHB, SDHC, SDHD, SMAD4, SMARCA4, STK11, TP53, TSC1, TSC2, and VHL.  RNA  data is routinely analyzed for use in variant interpretation for all genes.   09/14/2022 Surgery   Left lumpectomy, IDC, 2.1cm, g2, margins negative, 1/3 LN positive for metastases, T2n1a   10/04/2022 Oncotype testing   Oncotype DX of 15, no role for adj chemo.   10/25/2022 - 12/11/2022 Radiation Therapy   Plan Name: Breast_L_BH Site: Breast, Left Technique: 3D Mode: Photon Dose Per Fraction: 1.8 Gy Prescribed Dose (Delivered / Prescribed): 50.4 Gy / 50.4 Gy Prescribed Fxs (Delivered / Prescribed): 28 / 28   Plan Name: Brst_L_SCV_BH Site: Breast, Left Technique: 3D Mode: Photon Dose Per Fraction: 1.8 Gy Prescribed Dose (Delivered / Prescribed): 50.4 Gy / 50.4 Gy Prescribed Fxs (Delivered / Prescribed): 28 / 28   Plan Name: Brst_L_Bst_BH Site: Breast, Left Technique: 3D Mode: Photon Dose Per Fraction: 2 Gy Prescribed Dose (Delivered / Prescribed): 10 Gy / 10 Gy Prescribed Fxs (Delivered / Prescribed): 5 / 5     12/2022 -  Anti-estrogen oral therapy   Anastrozole daily, Verzenio added in 02/2023     Discussed the use of AI scribe software for clinical note transcription with the patient, who gave verbal consent to proceed.  History of Present Illness    Mr. Kelli Estrada is here for follow-up on Verzenio and anastrozole.    She has been experiencing right ear pain for the past three months, initially noticing a sore inside her right ear that was tender and difficult to clean. Despite being prescribed probiotics, the sore persisted. Recently, the pain intensified, preventing her from touching her ear, which led to a visit to the emergency room. She was prescribed ofloxacin for her ear condition.  During her attempt to visit the emergency room, she  fell off her porch, resulting in left knee pain. The pain was severe enough to warrant X-rays, as it made walking difficult. She was prescribed naproxen for pain management.  She is currently undergoing treatment for breast cancer with  Verzenio and anastrozole, which she tolerates well with only occasional nausea. She adheres to a daily regimen for these medications. Recent blood work is stable, and she has been alternating follow-up visits between her current provider and Dr. [name].  A recent bone density test indicated mild osteopenia, primarily affecting her back, while her hips remain in good condition.  She is facing challenges in finding an ENT specialist who accepts her insurance. Additionally, her primary care doctor is transitioning to focus on pain management, complicating the scheduling of follow-up appointments. She is also in the process of finding an eye doctor who does not require telemedicine visits.  Rest of the pertinent 10 point ROS reviewed and neg.  ALLERGIES:  has no known allergies.  MEDICATIONS:  Current Outpatient Medications  Medication Sig Dispense Refill   abemaciclib (VERZENIO) 100 MG tablet Take 1 tablet (100 mg total) by mouth 2 (two) times daily. 56 tablet 1   albuterol (VENTOLIN HFA) 108 (90 Base) MCG/ACT inhaler SMARTSIG:1 Puff(s) Via Inhaler 4 Times Daily PRN     alendronate (FOSAMAX) 70 MG tablet Take 70 mg by mouth once a week.     anastrozole (ARIMIDEX) 1 MG tablet Take 1 tablet (1 mg total) by mouth daily. 90 tablet 3   Blood Glucose Monitoring Suppl (ONETOUCH VERIO REFLECT) w/Device KIT See admin instructions.     Cholecalciferol (VITAMIN D3) 250 MCG (10000 UT) capsule Take 10,000 Units by mouth daily.     CVS VITAMIN E 180 MG (400 UNIT) CAPS Take 2 capsules by mouth daily.     fenofibrate (TRICOR) 48 MG tablet Take 40 mg by mouth daily.     Fluticasone-Umeclidin-Vilant (TRELEGY ELLIPTA) 100-62.5-25 MCG/ACT AEPB Inhale 100 mcg into the lungs 1 day or 1 dose.     gabapentin (NEURONTIN) 300 MG capsule Take 300 mg by mouth 3 (three) times daily.     Ginger 500 MG CAPS Take by mouth.     hydrOXYzine (VISTARIL) 25 MG capsule Take 25 mg by mouth 2 (two) times daily as needed for anxiety.   0    loperamide (IMODIUM) 2 MG capsule Take 2 mg by mouth as needed for diarrhea or loose stools.     mirabegron ER (MYRBETRIQ) 25 MG TB24 tablet Take 1 tablet (25 mg total) by mouth daily. 30 tablet 11   Misc Natural Products (OSTEO BI-FLEX JOINT SHIELD PO) Take by mouth daily.     naproxen (NAPROSYN) 500 MG tablet Take 1 tablet (500 mg total) by mouth 2 (two) times daily with a meal. 30 tablet 0   ofloxacin (FLOXIN) 0.3 % OTIC solution Place 5 drops into the right ear 2 (two) times daily. 5 mL 0   ofloxacin (FLOXIN) 0.3 % OTIC solution Place 5 drops into the right ear 2 (two) times daily. 5 mL 0   omeprazole (PRILOSEC OTC) 20 MG tablet Take 20 mg by mouth daily.     ondansetron (ZOFRAN) 8 MG tablet Take 1 tablet (8 mg total) by mouth every 8 (eight) hours as needed for nausea or vomiting. 30 tablet 2   Oxcarbazepine (TRILEPTAL) 300 MG tablet Take 300 mg by mouth 2 (two) times daily.     oxyCODONE-acetaminophen (PERCOCET/ROXICET) 5-325 MG tablet Take 1 tablet by mouth every 6 (six)  hours as needed (For pain.).      OZEMPIC, 1 MG/DOSE, 4 MG/3ML SOPN Inject 1 mg into the skin once a week.     prochlorperazine (COMPAZINE) 10 MG tablet Take 1 tablet (10 mg total) by mouth every 6 (six) hours as needed for nausea or vomiting. 30 tablet 2   rosuvastatin (CRESTOR) 20 MG tablet Take 20 mg by mouth at bedtime.     sertraline (ZOLOFT) 100 MG tablet Take 100 mg by mouth 2 (two) times daily.  2   tamsulosin (FLOMAX) 0.4 MG CAPS capsule Take 1 capsule (0.4 mg total) by mouth daily. 90 capsule 3   topiramate (TOPAMAX) 100 MG tablet Take 100 mg by mouth daily.     No current facility-administered medications for this visit.    PHYSICAL EXAMINATION: ECOG PERFORMANCE STATUS: 0 - Asymptomatic  Vitals:   12/23/23 1208  BP: (!) 146/96  Pulse: 93  Resp: 17  Temp: 98 F (36.7 C)  SpO2: 98%   Filed Weights   12/23/23 1208  Weight: 264 lb 9.6 oz (120 kg)   Physical Exam Constitutional:      Appearance:  Normal appearance.  Cardiovascular:     Rate and Rhythm: Normal rate and regular rhythm.     Pulses: Normal pulses.     Heart sounds: Normal heart sounds.  Pulmonary:     Effort: Pulmonary effort is normal.     Breath sounds: Normal breath sounds.  Musculoskeletal:     Cervical back: Normal range of motion and neck supple. No rigidity.  Lymphadenopathy:     Cervical: No cervical adenopathy.  Skin:    General: Skin is warm and dry.  Neurological:     Mental Status: She is alert.        LABORATORY DATA:  I have reviewed the data as listed    Latest Ref Rng & Units 12/23/2023   11:45 AM 10/23/2023   11:46 AM 08/21/2023   12:49 PM  CMP  Glucose 70 - 99 mg/dL 811  914  93   BUN 8 - 23 mg/dL 18  21  14    Creatinine 0.44 - 1.00 mg/dL 7.82  9.56  2.13   Sodium 135 - 145 mmol/L 139  141  141   Potassium 3.5 - 5.1 mmol/L 3.8  3.8  3.9   Chloride 98 - 111 mmol/L 104  106  106   CO2 22 - 32 mmol/L 27  29  30    Calcium 8.9 - 10.3 mg/dL 8.8  9.3  9.1   Total Protein 6.5 - 8.1 g/dL 6.9  6.7  6.5   Total Bilirubin 0.0 - 1.2 mg/dL 0.4  0.3  0.5   Alkaline Phos 38 - 126 U/L 66  69  61   AST 15 - 41 U/L 14  13  14    ALT 0 - 44 U/L 15  11  13      Lab Results  Component Value Date   WBC 5.5 12/23/2023   HGB 12.9 12/23/2023   HCT 40.1 12/23/2023   MCV 89.1 12/23/2023   PLT 168 12/23/2023   NEUTROABS 4.0 12/23/2023    ASSESSMENT & PLAN:  Malignant neoplasm of central portion of left breast in female, estrogen receptor positive (HCC) Breast Cancer Patient is currently on Verzenio and Anastrozole. No new side effects reported. -Continue Verzenio and Anastrozole as prescribed. -Labs reviewed, no major concerns. -She will complete 2 yrs of verzenio in May 2026. -Follow up with CPP and me  alternating.  Right Ear Pain Persistent sore in the right ear causing pain and tenderness -Continue Ofloxacin as prescribed by the ER doctor. -Follow up with an ENT specialist within two weeks as  recommended by the ER doctor.  Fall and Left Knee Pain Patient fell off the porch due to loss of balance, causing pain in the left knee. X-rays were done in the ER. -Continue Naproxen for pain as prescribed by the ER doctor.  Mild Osteopenia Diagnosed via bone density scan. No significant changes or concerns. -She was on fosamax based on medication records Continue Vit D, calcium as tolerated  ( fosamax prescribed by another physician)  Rachel Moulds MD    No orders of the defined types were placed in this encounter.   Rachel Moulds, MD 12/23/23

## 2023-12-23 NOTE — Assessment & Plan Note (Addendum)
 Breast Cancer Patient is currently on Verzenio and Anastrozole. No new side effects reported. -Continue Verzenio and Anastrozole as prescribed. -Labs reviewed, no major concerns. -She will complete 2 yrs of verzenio in May 2026. -Follow up with CPP and me alternating.  Right Ear Pain Persistent sore in the right ear causing pain and tenderness -Continue Ofloxacin as prescribed by the ER doctor. -Follow up with an ENT specialist within two weeks as recommended by the ER doctor.  Fall and Left Knee Pain Patient fell off the porch due to loss of balance, causing pain in the left knee. X-rays were done in the ER. -Continue Naproxen for pain as prescribed by the ER doctor.  Mild Osteopenia Diagnosed via bone density scan. No significant changes or concerns. -She was on fosamax based on medication records Continue Vit D, calcium as tolerated  ( fosamax prescribed by another physician)  Rachel Moulds MD

## 2024-01-03 ENCOUNTER — Other Ambulatory Visit: Payer: Self-pay

## 2024-01-03 NOTE — Progress Notes (Signed)
 Specialty Pharmacy Refill Coordination Note  Kelli Estrada is a 63 y.o. female contacted today regarding refills of specialty medication(s) Abemaciclib [name])   Patient requested Delivery   Delivery date: 01/08/24   Verified address: [address] [place], [place] [address]   Medication will be filled on 03.25.25.

## 2024-01-06 ENCOUNTER — Ambulatory Visit: Payer: 59 | Admitting: Urology

## 2024-01-06 ENCOUNTER — Ambulatory Visit (HOSPITAL_COMMUNITY)
Admission: RE | Admit: 2024-01-06 | Discharge: 2024-01-06 | Disposition: A | Discharge status: Home / Self Care | Source: Ambulatory Visit | Attending: Urology | Admitting: Urology

## 2024-01-06 ENCOUNTER — Other Ambulatory Visit: Payer: Self-pay | Admitting: Hematology and Oncology

## 2024-01-06 VITALS — BP 101/71 | HR 82

## 2024-01-06 DIAGNOSIS — N200 Calculus of kidney: Secondary | ICD-10-CM

## 2024-01-06 LAB — MICROSCOPIC EXAMINATION: Epithelial Cells (non renal): >10 /HPF — AB (ref 0–10)

## 2024-01-06 LAB — URINALYSIS, ROUTINE W REFLEX MICROSCOPIC
Bilirubin, UA: NEGATIVE
Glucose, UA: NEGATIVE
Ketones, UA: NEGATIVE
Nitrite, UA: POSITIVE — AB
Protein,UA: NEGATIVE
Specific Gravity, UA: 1.025 (ref 1.005–1.030)
Urobilinogen, Ur: 1 mg/dL (ref 0.2–1.0)
pH, UA: 6 (ref 5.0–7.5)

## 2024-01-06 NOTE — Progress Notes (Signed)
 01/06/2024 11:31 AM   Norm Parcel 29-May-1961 [id number]  Referring provider: Frederich Chick., MD [address] [place],  [place] [address]  Followup nephrolithiasis  HPI: Ms Winstanley is a 63yo here for followup for nephrolithiasis. KUB from today shows 6mm left lower pole calculus. She drinks 40-50oz of water daily. She denies any flank pain. No stone events since last visit. She denies nay significant LUTS   PMH: Past Medical History:  Diagnosis Date   Anxiety    Chronic pain    Depression    Family history of breast cancer 08/23/2022   H/O degenerative disc disease    History of radiation therapy    Left breast- 10/25/22-12/11/22- Dr. Antony Blackbird   History of radiation therapy    Left breast-10/25/22-12/11/22- Dr. Antony Blackbird   Pre-diabetes    PTSD (post-traumatic stress disorder)    Sleep apnea     Surgical History: Past Surgical History:  Procedure Laterality Date   ABDOMINAL HYSTERECTOMY     BREAST LUMPECTOMY WITH SENTINEL LYMPH NODE BIOPSY Left 09/14/2022   Procedure: LEFT BREAST CENTRAL LUMPECTOMY WITH SENTINEL LYMPH NODE BX;  Surgeon: Griselda Miner, MD;  Location: [facility];  Service: General;  Laterality: Left;   COLONOSCOPY WITH PROPOFOL N/A 03/15/2016   Procedure: COLONOSCOPY WITH PROPOFOL;  Surgeon: Dorena Cookey, MD;  Location: [facility];  Service: Endoscopy;  Laterality: N/A;   NECK SURGERY      Home Medications:  Allergies as of 01/06/2024   No Known Allergies      Medication List        Accurate as of January 06, 2024 11:31 AM. If you have any questions, ask your nurse or doctor.          albuterol 108 (90 Base) MCG/ACT inhaler Commonly known as: VENTOLIN HFA SMARTSIG:1 Puff(s) Via Inhaler 4 Times Daily PRN   alendronate 70 MG tablet Commonly known as: FOSAMAX Take 70 mg by mouth once a week.   anastrozole 1 MG tablet Commonly known as: ARIMIDEX Take 1 tablet (1 mg total) by mouth daily.   [facility] Vitamin E 180 MG  (400 UNIT) Caps Generic drug: Vitamin E Take 2 capsules by mouth daily.   fenofibrate 48 MG tablet Commonly known as: TRICOR Take 40 mg by mouth daily.   gabapentin 300 MG capsule Commonly known as: NEURONTIN Take 300 mg by mouth 3 (three) times daily.   Ginger 500 MG Caps Take by mouth.   hydrOXYzine 25 MG capsule Commonly known as: VISTARIL Take 25 mg by mouth 2 (two) times daily as needed for anxiety.   loperamide 2 MG capsule Commonly known as: IMODIUM Take 2 mg by mouth as needed for diarrhea or loose stools.   mirabegron ER 25 MG Tb24 tablet Commonly known as: MYRBETRIQ Take 1 tablet (25 mg total) by mouth daily.   naproxen 500 MG tablet Commonly known as: Naprosyn Take 1 tablet (500 mg total) by mouth 2 (two) times daily with a meal.   ofloxacin 0.3 % OTIC solution Commonly known as: FLOXIN Place 5 drops into the right ear 2 (two) times daily.   ofloxacin 0.3 % OTIC solution Commonly known as: FLOXIN Place 5 drops into the right ear 2 (two) times daily.   omeprazole 20 MG tablet Commonly known as: PRILOSEC OTC Take 20 mg by mouth daily.   ondansetron 8 MG tablet Commonly known as: ZOFRAN Take 1 tablet (8 mg total) by mouth every 8 (eight) hours as needed  for nausea or vomiting.   OneTouch Verio Reflect w/Device Kit See admin instructions.   OSTEO BI-FLEX JOINT SHIELD PO Take by mouth daily.   Oxcarbazepine 300 MG tablet Commonly known as: TRILEPTAL Take 300 mg by mouth 2 (two) times daily.   oxyCODONE-acetaminophen 5-325 MG tablet Commonly known as: PERCOCET/ROXICET Take 1 tablet by mouth every 6 (six) hours as needed (For pain.).   Ozempic (1 MG/DOSE) 4 MG/3ML Sopn Generic drug: Semaglutide (1 MG/DOSE) Inject 1 mg into the skin once a week.   prochlorperazine 10 MG tablet Commonly known as: COMPAZINE Take 1 tablet (10 mg total) by mouth every 6 (six) hours as needed for nausea or vomiting.   rosuvastatin 20 MG tablet Commonly known as:  CRESTOR Take 20 mg by mouth at bedtime.   sertraline 100 MG tablet Commonly known as: ZOLOFT Take 100 mg by mouth 2 (two) times daily.   tamsulosin 0.4 MG Caps capsule Commonly known as: FLOMAX Take 1 capsule (0.4 mg total) by mouth daily.   topiramate 100 MG tablet Commonly known as: TOPAMAX Take 100 mg by mouth daily.   Trelegy Ellipta 100-62.5-25 MCG/ACT Aepb Generic drug: Fluticasone-Umeclidin-Vilant Inhale 100 mcg into the lungs 1 day or 1 dose.   Verzenio 100 MG tablet Generic drug: abemaciclib Take 1 tablet (100 mg total) by mouth 2 (two) times daily.   Vitamin D3 250 MCG (10000 UT) capsule Take 10,000 Units by mouth daily.        Allergies: No Known Allergies  Family History: Family History  Problem Relation Age of Onset   Lung cancer Mother        dx > 78   Breast cancer Maternal Grandmother 64    Social History:  reports that she has never smoked. She has never used smokeless tobacco. She reports that she does not currently use alcohol. She reports that she does not use drugs.  ROS: All other review of systems were reviewed and are negative except what is noted above in HPI  Physical Exam: BP 101/71   Pulse 82   Constitutional:  Alert and oriented, No acute distress. HEENT: [place] AT, moist mucus membranes.  Trachea midline, no masses. Cardiovascular: No clubbing, cyanosis, or edema. Respiratory: Normal respiratory effort, no increased work of breathing. GI: Abdomen is soft, nontender, nondistended, no abdominal masses GU: No CVA tenderness.  Lymph: No cervical or inguinal lymphadenopathy. Skin: No rashes, bruises or suspicious lesions. Neurologic: Grossly intact, no focal deficits, moving all 4 extremities. Psychiatric: Normal mood and affect.  Laboratory Data: Lab Results  Component Value Date   WBC 5.5 12/23/2023   HGB 12.9 12/23/2023   HCT 40.1 12/23/2023   MCV 89.1 12/23/2023   PLT 168 12/23/2023    Lab Results  Component Value Date    CREATININE 0.88 12/23/2023    No results found for: "PSA"  No results found for: "TESTOSTERONE"  No results found for: "HGBA1C"  Urinalysis    Component Value Date/Time   COLORURINE YELLOW 05/28/2022 0419   APPEARANCEUR Clear 07/08/2023 1152   LABSPEC 1.023 05/28/2022 0419   PHURINE 5.0 05/28/2022 0419   GLUCOSEU Negative 07/08/2023 1152   HGBUR MODERATE (A) 05/28/2022 0419   BILIRUBINUR Negative 07/08/2023 1152   KETONESUR NEGATIVE 05/28/2022 0419   PROTEINUR Negative 07/08/2023 1152   PROTEINUR 30 (A) 05/28/2022 0419   NITRITE Negative 07/08/2023 1152   NITRITE NEGATIVE 05/28/2022 0419   LEUKOCYTESUR 1+ (A) 07/08/2023 1152   LEUKOCYTESUR TRACE (A) 05/28/2022 0419  Lab Results  Component Value Date   LABMICR See below: 07/08/2023   WBCUA 11-30 (A) 07/08/2023   LABEPIT 0-10 07/08/2023   BACTERIA Many (A) 07/08/2023    Pertinent Imaging: KUb today: Images reviewed and discussed with the patient Results for orders placed during the hospital encounter of 07/08/23  Abdomen 1 view (KUB)  Narrative CLINICAL DATA:  Nephrolithiasis.  EXAM: ABDOMEN - 1 VIEW  COMPARISON:  02/05/2023.  FINDINGS: The bowel gas pattern is normal. 6 mm nodule overlies the expected location of the left kidney or proximal ureter  IMPRESSION: Probable nephrolithiasis which can be confirmed with noncontrast CT.   Electronically Signed By: Layla Maw M.D. On: 07/26/2023 16:32  No results found for this or any previous visit.  No results found for this or any previous visit.  No results found for this or any previous visit.  Results for orders placed during the hospital encounter of 07/05/22  Ultrasound renal complete  Narrative CLINICAL DATA:  Follow-up left renal calculi.  EXAM: RENAL / URINARY TRACT ULTRASOUND COMPLETE  COMPARISON:  CT scan of the abdomen and pelvis May 28, 2022.  FINDINGS: Right Kidney:  Renal measurements: 13.7 x 4.9 x 5.4 cm = volume:  186 mL. Echogenicity within normal limits. No mass or hydronephrosis visualized.  Left Kidney:  Renal measurements: 30.3 x 4.9 x 6.2 cm = volume: 212 mL. Contains a 1.4 cm cyst. No follow-up imaging recommended for the cyst. Contains an 8 mm nonobstructive stone.  Bladder:  Appears normal for degree of bladder distention.  Other:  None.  IMPRESSION: 1. There is an 8 mm nonobstructive stone in the left kidney. 2. The kidneys are otherwise unremarkable. 3. The bladder is normal.   Electronically Signed By: Gerome Sam III M.D. On: 07/05/2022 16:50  No results found for this or any previous visit.  No results found for this or any previous visit.  Results for orders placed during the hospital encounter of 05/28/22  CT RENAL STONE STUDY  Narrative CLINICAL DATA:  Left lower quadrant pain.  EXAM: CT ABDOMEN AND PELVIS WITHOUT CONTRAST  TECHNIQUE: Multidetector CT imaging of the abdomen and pelvis was performed following the standard protocol without IV contrast.  RADIATION DOSE REDUCTION: This exam was performed according to the departmental dose-optimization program which includes automated exposure control, adjustment of the mA and/or kV according to patient size and/or use of iterative reconstruction technique.  COMPARISON:  April 29, 2018  FINDINGS: Lower chest: No acute abnormality.  Hepatobiliary: No focal liver abnormality is seen. No gallstones, gallbladder wall thickening, or biliary dilatation.  Pancreas: Unremarkable. No pancreatic ductal dilatation or surrounding inflammatory changes.  Spleen: Normal in size without focal abnormality.  Adrenals/Urinary Tract: Adrenal glands are unremarkable. Kidneys are normal in size, without focal lesions. A 3 mm nonobstructing renal calculus is seen within the posterior aspect of the mid to lower left kidney. An additional 3 mm obstructing renal calculus is seen at the left UVJ. Mild to moderate  severity left-sided hydronephrosis and hydroureter are seen. The urinary bladder is poorly distended and subsequently limited in evaluation.  Stomach/Bowel: Stomach is within normal limits. The appendix is not clearly identified. No evidence of bowel wall thickening, distention, or inflammatory changes. Noninflamed diverticula are seen throughout the sigmoid colon.  Vascular/Lymphatic: No significant vascular findings are present. No enlarged abdominal or pelvic lymph nodes.  Reproductive: Status post hysterectomy. No adnexal masses.  Other: No abdominal wall hernia or abnormality. No abdominopelvic ascites.  Musculoskeletal: No acute  or significant osseous findings.  IMPRESSION: 1. 3 mm obstructing renal calculus at the left UVJ. 2. 3 mm nonobstructing left renal calculus. 3. Sigmoid diverticulosis.   Electronically Signed By: Aram Candela M.D. On: 05/28/2022 05:03   Assessment & Plan:    1. Kidney stones (Primary) Followup 6 months with a KUB - Urinalysis, Routine w reflex microscopic   No follow-ups on file.  Wilkie Aye, MD  [facility] [place]

## 2024-01-07 ENCOUNTER — Other Ambulatory Visit: Payer: Self-pay

## 2024-01-10 LAB — URINE CULTURE

## 2024-01-11 ENCOUNTER — Encounter: Payer: Self-pay | Admitting: Urology

## 2024-01-11 NOTE — Patient Instructions (Signed)

## 2024-01-15 ENCOUNTER — Telehealth: Payer: Self-pay

## 2024-01-15 MED ORDER — NITROFURANTOIN MONOHYD MACRO 100 MG PO CAPS: 100.0000 mg | ORAL_CAPSULE | Freq: Two times a day (BID) | ORAL | 0 refills | Status: DC

## 2024-01-15 NOTE — Telephone Encounter (Signed)
 Patient called with no answer, My chart message sent.

## 2024-01-15 NOTE — Telephone Encounter (Signed)
Patient called and made aware of positive urine culture and antibiotic sent to pharmacy. 

## 2024-01-28 ENCOUNTER — Other Ambulatory Visit: Payer: Self-pay

## 2024-01-31 ENCOUNTER — Other Ambulatory Visit: Payer: Self-pay | Admitting: Hematology and Oncology

## 2024-01-31 ENCOUNTER — Other Ambulatory Visit: Payer: Self-pay

## 2024-01-31 ENCOUNTER — Other Ambulatory Visit (HOSPITAL_COMMUNITY): Payer: Self-pay

## 2024-01-31 MED ORDER — ABEMACICLIB 100 MG PO TABS
100.0000 mg | ORAL_TABLET | Freq: Two times a day (BID) | ORAL | 1 refills | Status: DC
  Filled 2024-01-31: qty 56, 28d supply, fill #0
  Filled 2024-06-10: qty 56, 28d supply, fill #1

## 2024-01-31 NOTE — Progress Notes (Signed)
 Specialty Pharmacy Refill Coordination Note  Kelli Estrada is a 63 y.o. female contacted today regarding refills of specialty medication(s) Abemaciclib  (VERZENIO )   Patient requested Delivery   Delivery date: 02/05/24   Verified address: [address] [place], [place] [address]   Medication will be filled on 02/04/24.

## 2024-02-04 ENCOUNTER — Other Ambulatory Visit: Payer: Self-pay

## 2024-02-10 ENCOUNTER — Other Ambulatory Visit: Payer: Self-pay

## 2024-02-10 ENCOUNTER — Encounter (HOSPITAL_COMMUNITY): Payer: Self-pay

## 2024-02-10 ENCOUNTER — Emergency Department (HOSPITAL_COMMUNITY)

## 2024-02-10 ENCOUNTER — Emergency Department (HOSPITAL_COMMUNITY)
Admission: EM | Admit: 2024-02-10 | Discharge: 2024-02-10 | Disposition: A | Discharge status: Home / Self Care | Attending: Emergency Medicine | Admitting: Emergency Medicine

## 2024-02-10 DIAGNOSIS — W010XXA Fall on same level from slipping, tripping and stumbling without subsequent striking against object, initial encounter: Secondary | ICD-10-CM | POA: Diagnosis not present

## 2024-02-10 DIAGNOSIS — S20419A Abrasion of unspecified back wall of thorax, initial encounter: Secondary | ICD-10-CM | POA: Insufficient documentation

## 2024-02-10 DIAGNOSIS — S80212A Abrasion, left knee, initial encounter: Secondary | ICD-10-CM | POA: Insufficient documentation

## 2024-02-10 DIAGNOSIS — S50312A Abrasion of left elbow, initial encounter: Secondary | ICD-10-CM | POA: Insufficient documentation

## 2024-02-10 DIAGNOSIS — S80211A Abrasion, right knee, initial encounter: Secondary | ICD-10-CM | POA: Insufficient documentation

## 2024-02-10 DIAGNOSIS — Y92019 Unspecified place in single-family (private) house as the place of occurrence of the external cause: Secondary | ICD-10-CM | POA: Insufficient documentation

## 2024-02-10 DIAGNOSIS — W19XXXA Unspecified fall, initial encounter: Secondary | ICD-10-CM

## 2024-02-10 DIAGNOSIS — S8991XA Unspecified injury of right lower leg, initial encounter: Secondary | ICD-10-CM | POA: Diagnosis present

## 2024-02-10 DIAGNOSIS — M542 Cervicalgia: Secondary | ICD-10-CM | POA: Insufficient documentation

## 2024-02-10 DIAGNOSIS — S0990XA Unspecified injury of head, initial encounter: Secondary | ICD-10-CM | POA: Insufficient documentation

## 2024-02-10 DIAGNOSIS — S30810A Abrasion of lower back and pelvis, initial encounter: Secondary | ICD-10-CM | POA: Diagnosis not present

## 2024-02-10 DIAGNOSIS — S8002XA Contusion of left knee, initial encounter: Secondary | ICD-10-CM

## 2024-02-10 DIAGNOSIS — S39012A Strain of muscle, fascia and tendon of lower back, initial encounter: Secondary | ICD-10-CM

## 2024-02-10 HISTORY — DX: Unspecified osteoarthritis, unspecified site: M19.90

## 2024-02-10 HISTORY — DX: Other intervertebral disc degeneration, thoracolumbar region: M51.35

## 2024-02-10 LAB — COMPREHENSIVE METABOLIC PANEL WITH GFR
ALT: 20 U/L (ref 0–44)
AST: 18 U/L (ref 15–41)
Albumin: 3.4 g/dL — ABNORMAL LOW (ref 3.5–5.0)
Alkaline Phosphatase: 62 U/L (ref 38–126)
Anion gap: 11 (ref 5–15)
BUN: 18 mg/dL (ref 8–23)
CO2: 23 mmol/L (ref 22–32)
Calcium: 9 mg/dL (ref 8.9–10.3)
Chloride: 103 mmol/L (ref 98–111)
Creatinine, Ser: 0.78 mg/dL (ref 0.44–1.00)
GFR, Estimated: >60 mL/min (ref >60)
Glucose, Bld: 117 mg/dL — ABNORMAL HIGH (ref 70–99)
Potassium: 3.9 mmol/L (ref 3.5–5.1)
Sodium: 137 mmol/L (ref 135–145)
Total Bilirubin: 0.7 mg/dL (ref 0.0–1.2)
Total Protein: 6.4 g/dL — ABNORMAL LOW (ref 6.5–8.1)

## 2024-02-10 LAB — CBC WITH DIFFERENTIAL/PLATELET
Abs Immature Granulocytes: 0.02 10*3/uL (ref 0.00–0.07)
Basophils Absolute: 0 10*3/uL (ref 0.0–0.1)
Basophils Relative: 1 %
Eosinophils Absolute: 0.1 10*3/uL (ref 0.0–0.5)
Eosinophils Relative: 2 %
HCT: 37.4 % (ref 36.0–46.0)
Hemoglobin: 12 g/dL (ref 12.0–15.0)
Immature Granulocytes: 1 %
Lymphocytes Relative: 18 %
Lymphs Abs: 0.8 10*3/uL (ref 0.7–4.0)
MCH: 29.6 pg (ref 26.0–34.0)
MCHC: 32.1 g/dL (ref 30.0–36.0)
MCV: 92.1 fL (ref 80.0–100.0)
Monocytes Absolute: 0.3 10*3/uL (ref 0.1–1.0)
Monocytes Relative: 8 %
Neutro Abs: 3.1 10*3/uL (ref 1.7–7.7)
Neutrophils Relative %: 70 %
Platelets: 154 10*3/uL (ref 150–400)
RBC: 4.06 MIL/uL (ref 3.87–5.11)
RDW: 14.4 % (ref 11.5–15.5)
WBC: 4.4 10*3/uL (ref 4.0–10.5)
nRBC: 0 % (ref 0.0–0.2)

## 2024-02-10 LAB — TROPONIN I (HIGH SENSITIVITY): Troponin I (High Sensitivity): 3 ng/L (ref <18)

## 2024-02-10 MED ORDER — METHOCARBAMOL 750 MG PO TABS: 750.0000 mg | ORAL_TABLET | Freq: Three times a day (TID) | ORAL | 0 refills | Status: AC

## 2024-02-10 MED ORDER — NAPROXEN 500 MG PO TABS: 500.0000 mg | ORAL_TABLET | Freq: Two times a day (BID) | ORAL | 0 refills | Status: DC

## 2024-02-10 MED ORDER — OXYCODONE-ACETAMINOPHEN 5-325 MG PO TABS
1.0000 | ORAL_TABLET | Freq: Once | ORAL | Status: AC
  Administered 2024-02-10: 1 via ORAL
  Filled 2024-02-10: qty 1

## 2024-02-10 NOTE — ED Provider Notes (Signed)
 [place] EMERGENCY DEPARTMENT AT [facility] Provider Note   CSN: [id number] Arrival date & time: 02/10/24  1007     History  Chief Complaint  Patient presents with   [place]    Kelli Estrada is a 63 y.o. female.  Patient is a 63 year old female who presents to the emergency department with a chief complaint of pain to her back and left knee.  Patient notes that she had a fall just prior to arrival while walking into her house.  She notes that the fall was mechanical in nature as she tripped on the first step in her house.  Patient is unsure if she struck her head.  She does note that she is having some pain to her left elbow but denies any other pain to her left upper extremities.  Patient denies any associated chest pain or abdominal pain.  She notes that she did strike bilateral knees but notes that the left knee is hurting her the worst.  She denies any numbness or paresthesias.   Fall       Home Medications Prior to Admission medications   Medication Sig Start Date End Date Taking? Authorizing Provider  abemaciclib  (VERZENIO ) 100 MG tablet Take 1 tablet (100 mg total) by mouth 2 (two) times daily. 01/31/24   Iruku, Praveena, MD  albuterol (VENTOLIN HFA) 108 (90 Base) MCG/ACT inhaler SMARTSIG:1 Puff(s) Via Inhaler 4 Times Daily PRN 04/06/21   [provider]  alendronate (FOSAMAX) 70 MG tablet Take 70 mg by mouth once a week. 06/28/21   [provider]  anastrozole  (ARIMIDEX ) 1 MG tablet TAKE 1 TABLET BY MOUTH EVERY DAY 01/06/24   Iruku, Praveena, MD  Blood Glucose Monitoring Suppl (ONETOUCH VERIO REFLECT) w/Device KIT See admin instructions. 04/03/21   [provider]  Cholecalciferol (VITAMIN D3) 250 MCG (10000 UT) capsule Take 10,000 Units by mouth daily.    [provider]  [facility] VITAMIN E 180 MG (400 UNIT) CAPS Take 2 capsules by mouth daily. 04/08/21   [provider]  fenofibrate (TRICOR) 48 MG tablet Take 40 mg by mouth  daily.    [provider]  Fluticasone-Umeclidin-Vilant (TRELEGY ELLIPTA) 100-62.5-25 MCG/ACT AEPB Inhale 100 mcg into the lungs 1 day or 1 dose.    [provider]  gabapentin  (NEURONTIN ) 300 MG capsule Take 300 mg by mouth 3 (three) times daily. 02/24/16   [provider]  Ginger 500 MG CAPS Take by mouth.    [provider]  hydrOXYzine (VISTARIL) 25 MG capsule Take 25 mg by mouth 2 (two) times daily as needed for anxiety.  04/16/18   [provider]  loperamide (IMODIUM) 2 MG capsule Take 2 mg by mouth as needed for diarrhea or loose stools.    [provider]  mirabegron  ER (MYRBETRIQ ) 25 MG TB24 tablet Take 1 tablet (25 mg total) by mouth daily. 08/07/23   McKenzie, Arden Beck, MD  Misc Natural Products (OSTEO BI-FLEX JOINT SHIELD PO) Take by mouth daily.    [provider]  naproxen  (NAPROSYN ) 500 MG tablet Take 1 tablet (500 mg total) by mouth 2 (two) times daily with a meal. 12/20/23   Early Glisson, MD  nitrofurantoin , macrocrystal-monohydrate, (MACROBID ) 100 MG capsule Take 1 capsule (100 mg total) by mouth 2 (two) times daily. 01/15/24   McKenzie, Arden Beck, MD  ofloxacin  (FLOXIN ) 0.3 % OTIC solution Place 5 drops into the right ear 2 (two) times daily. 12/20/23   Early Glisson, MD  ofloxacin  (  FLOXIN ) 0.3 % OTIC solution Place 5 drops into the right ear 2 (two) times daily. 12/20/23   Early Glisson, MD  omeprazole (PRILOSEC OTC) 20 MG tablet Take 20 mg by mouth daily.    [provider]  ondansetron  (ZOFRAN ) 8 MG tablet Take 1 tablet (8 mg total) by mouth every 8 (eight) hours as needed for nausea or vomiting. 02/12/23   Iruku, Praveena, MD  Oxcarbazepine (TRILEPTAL) 300 MG tablet Take 300 mg by mouth 2 (two) times daily. 04/03/15   [provider]  oxyCODONE -acetaminophen  (PERCOCET/ROXICET) 5-325 MG tablet Take 1 tablet by mouth every 6 (six) hours as needed (For pain.).  06/03/15   [provider]  OZEMPIC, 1  MG/DOSE, 4 MG/3ML SOPN Inject 1 mg into the skin once a week. 03/10/21   [provider]  prochlorperazine  (COMPAZINE ) 10 MG tablet Take 1 tablet (10 mg total) by mouth every 6 (six) hours as needed for nausea or vomiting. 02/12/23   Iruku, Praveena, MD  rosuvastatin (CRESTOR) 20 MG tablet Take 20 mg by mouth at bedtime. 07/18/21   [provider]  sertraline (ZOLOFT) 100 MG tablet Take 100 mg by mouth 2 (two) times daily. 02/21/16   [provider]  tamsulosin  (FLOMAX ) 0.4 MG CAPS capsule Take 1 capsule (0.4 mg total) by mouth daily. 07/08/23   McKenzie, Arden Beck, MD  topiramate (TOPAMAX) 100 MG tablet Take 100 mg by mouth daily. 07/16/21   [provider]      Allergies    Patient has no known allergies.    Review of Systems   Review of Systems  Musculoskeletal:        Pain to bilateral knees, left elbow, thoracic and lumbar spine  All other systems reviewed and are negative.   Physical Exam Updated Vital Signs BP 106/68 (BP Location: Right Arm)   Pulse 88   Temp 97.8 F (36.6 C) (Temporal)   Resp 16   Ht 5\' 3"  (1.6 m)   Wt 120 kg   SpO2 98%   BMI 46.86 kg/m  Physical Exam Vitals and nursing note reviewed.  Constitutional:      Appearance: Normal appearance.  HENT:     Head: Normocephalic and atraumatic.     Nose: Nose normal.     Mouth/Throat:     Mouth: Mucous membranes are moist.  Eyes:     Extraocular Movements: Extraocular movements intact.     Conjunctiva/sclera: Conjunctivae normal.     Pupils: Pupils are equal, round, and reactive to light.  Neck:     Comments: No step-off or deformity, tender to palpation over lower cervical spine Cardiovascular:     Rate and Rhythm: Normal rate and regular rhythm.     Pulses: Normal pulses.     Heart sounds: Normal heart sounds. No murmur heard.    No gallop.  Pulmonary:     Effort: Pulmonary effort is normal. No respiratory distress.     Breath sounds: Normal breath sounds. No stridor. No  wheezing, rhonchi or rales.  Chest:     Chest wall: No tenderness.  Abdominal:     General: Abdomen is flat. Bowel sounds are normal. There is no distension.     Palpations: Abdomen is soft. There is no mass.     Tenderness: There is no abdominal tenderness. There is no guarding.  Musculoskeletal:        General: Normal range of motion.     Cervical back: Normal range of motion and neck  supple. Tenderness present. No rigidity.     Comments: Tenderness palpation noted over left elbow, nontender palpation remainder bilateral extremities, abrasion noted over medial aspect of left elbow, radial pulse 2+ upper extremities, radial, ulnar, median, axillary nerve function intact in bilateral extremities, full range of motion noted throughout, no obvious deformity or bruising, no skin breakdown ulceration Tender to palpation noted over bilateral knees, nontender palpation over bilateral hips, ankles or feet, DP and PT pulse 2+ distally, pelvis stable AP lateral compression, abrasions noted over anterior aspect of bilateral knees, sensation intact distally throughout, no obvious deformity, no skin breakdown ulceration Tenderness palpation noted over thoracic and lumbar spine, no step-off or deformity, no CVA tenderness  Skin:    General: Skin is warm and dry.     Findings: No rash.  Neurological:     General: No focal deficit present.     Mental Status: She is alert and oriented to person, place, and time. Mental status is at baseline.     Cranial Nerves: No cranial nerve deficit.     Sensory: No sensory deficit.     Motor: No weakness.     Coordination: Coordination normal.  Psychiatric:        Mood and Affect: Mood normal.        Behavior: Behavior normal.        Thought Content: Thought content normal.        Judgment: Judgment normal.     ED Results / Procedures / Treatments   Labs (all labs ordered are listed, but only abnormal results are displayed) Labs Reviewed - No data to  display  EKG None  Radiology No results found.  Procedures Procedures    Medications Ordered in ED Medications - No data to display  ED Course/ Medical Decision Making/ A&P                                 Medical Decision Making Amount and/or Complexity of Data Reviewed Labs: ordered. Radiology: ordered.  Risk Prescription drug management.  This patient presents to the ED for concern of fall, back pain, left knee pain differential diagnosis includes vertebral fracture, long bone or joint fracture, contusion, sprain, strain    Additional history obtained:  Additional history obtained from none External records from outside source obtained and reviewed including none   Lab Tests:  I Ordered, and personally interpreted labs.  The pertinent results include: No leukocytosis, no anemia, normal kidney function liver function, normal electrolytes, negative troponin   Imaging Studies ordered:  I ordered imaging studies including CT scan of head, cervical spine, thoracic spine, lumbar spine, x-ray of bilateral knees, left elbow and pelvis I independently visualized and interpreted imaging which showed no acute intracranial hemorrhage, no vertebral fractures, no fracture bilateral knees, no fracture left elbow, no pelvis fracture I agree with the radiologist interpretation   Medicines ordered and prescription drug management:  I ordered medication including naproxen , Robaxin for back strain, left knee sprain Reevaluation of the patient after these medicines showed that the patient improved I have reviewed the patients home medicines and have made adjustments as needed   Problem List / ED Course:  He is doing well at this time and is stable for discharge home.  Discussed with patient that all imaging Emergency Department has been unremarkable.  CT scan of the head demonstrated no signs of acute intracranial hemorrhage.  CT scan of the cervical spine,  thoracic spine and  lumbar spine demonstrated no signs of acute osseous injury or lesions.  She had no acute osseous injury or lesions noted to bilateral knees, left elbow or pelvis.  Patient was nontender to palpation over chest wall and abdomen and there was no abdominal distention or bruising.  She has no concerning neurological deficits at this time.  Suspect that patient is suffering from a strain to her back as well as left knee sprain.  Will provide knee immobilizer and crutches and recommend close follow-up with orthopedics.  Will continue symptomatic treatment on outpatient basis.  Strict turn precautions were provided for any new or worsening symptoms.  Patient voiced understanding and had no additional questions.   Social Determinants of Health:  None           Final Clinical Impression(s) / ED Diagnoses Final diagnoses:  None    Rx / DC Orders ED Discharge Orders     None         Emmalene Hare 02/10/24 1535    Dorenda Gandy, MD 02/11/24 [phone]

## 2024-02-10 NOTE — ED Triage Notes (Signed)
 Pt arrived via [facility] from home for complaints of pain following a mechanical fall. Pt endorses left side pain, more in her leg. Pt denies hitting her head. Denies LOC.

## 2024-02-10 NOTE — Discharge Instructions (Signed)
 These follow-up closely with your primary care doctor and orthopedics on an outpatient basis.  Return to emergency department immediately for any new or worsening symptoms.

## 2024-02-10 NOTE — ED Notes (Signed)
 Patient discharged. Provider spoke to patient. Paperwork given to patient and reviewed. Pt verbalized understanding. VSS. A+Ox4. Patient wheeled to car, used walker independently with steady gait outside. No iv in place.

## 2024-02-13 NOTE — Progress Notes (Unsigned)
 [facility]       Telephone: [phone]?Fax: [phone]   Oncology Clinical Pharmacist Practitioner Progress Note  Kelli Estrada was contacted via in-person to discuss her chemotherapy regimen for abemaciclib  which they receive under the care of Dr. Murleen Arms.   Current treatment regimen and start date Abemaciclib  (12/28/22) Anastrozole  (12/31/22)   Interval History She continues on abemaciclib  100 mg by mouth every 12 hours on days 1 to 28 of a 28-day cycle. This is being given in combination with anastrozole . Therapy is planned to continue until two years in the adjuvant setting per the [facility] trial data. She was seen today as a follow up to her abemaciclib  management. Kelli Estrada was last seen by clinical pharmacy on 10/23/23 and Dr. Arno Bibles on 12/23/23. She was in the ED on 02/10/24 after falling suffering a left knee contusion. Imaging was negative. BP was elevated today but patient still in some pain due to left knee contusion from 02/10/24. Asymptomatic at this time.  Response to Therapy Kelli Estrada is doing well. She is experiencing occasional nausea which she uses her prescribed anti-nausea medications. She also has occasional loose stools that she takes loperamide with success. No other side effects noted.  Labs, vitals, treatment parameters, and manufacturer guidelines assessing toxicity were reviewed with Kelli Estrada today. Based on these values, patient is in agreement to continue abemaciclib  therapy at this time.  Allergies No Known Allergies  Vitals    02/17/2024    1:48 PM 02/10/2024    2:38 PM 02/10/2024   10:46 AM  Oncology Vitals  Weight 122.925 kg    Weight (lbs) 271 lbs    BMI 48.01 kg/m2    Temp 97.9 F (36.6 C)    Pulse Rate 80 68   BP 146/76 110/75 109/69  Resp 18 20 14   SpO2 100 % 97 % 99 %  BSA (m2) 2.34 m2      Laboratory Data    Latest Ref Rng & Units 02/17/2024    1:25 PM 02/10/2024   11:38 AM 12/23/2023   11:45 AM  CBC EXTENDED   WBC 4.0 - 10.5 K/uL 5.0  4.4  5.5   RBC 3.87 - 5.11 MIL/uL 4.55  4.06  4.50   Hemoglobin 12.0 - 15.0 g/dL 29.5  62.1  30.8   HCT 36.0 - 46.0 % 40.9  37.4  40.1   Platelets 150 - 400 K/uL 153  154  168   NEUT# 1.7 - 7.7 K/uL 3.4  3.1  4.0   Lymph# 0.7 - 4.0 K/uL 1.1  0.8  1.1        Latest Ref Rng & Units 02/17/2024    1:25 PM 02/10/2024   11:38 AM 12/23/2023   11:45 AM  CMP  Glucose 70 - 99 mg/dL 99  657  846   BUN 8 - 23 mg/dL 22  18  18    Creatinine 0.44 - 1.00 mg/dL 9.62  9.52  8.41   Sodium 135 - 145 mmol/L 139  137  139   Potassium 3.5 - 5.1 mmol/L 4.9  3.9  3.8   Chloride 98 - 111 mmol/L 104  103  104   CO2 22 - 32 mmol/L 30  23  27    Calcium 8.9 - 10.3 mg/dL 9.6  9.0  8.8   Total Protein 6.5 - 8.1 g/dL 7.1  6.4  6.9   Total Bilirubin 0.0 - 1.2 mg/dL 0.4  0.7  0.4   Alkaline  Phos 38 - 126 U/L 77  62  66   AST 15 - 41 U/L 12  18  14    ALT 0 - 44 U/L 12  20  15     Adverse Effects Assessment As above, some loose stool and nausea. Good response from loperamide and anti-nausea meds  Adherence Assessment Kelli Estrada reports missing 2 doses over the past 8 weeks.   Reason for missed dose: forgot but feels she is doing much better with adherence. Patient was re-educated on importance of adherence.   Access Assessment Kelli Estrada is currently receiving her abemaciclib  through [facility] concerns:  none  Medication Reconciliation The patient's medication list was reviewed today with the patient? Yes New medications or herbal supplements have recently been started? Yes  Any medications have been discontinued? No  The medication list was updated and reconciled based on the patient's most recent medication list in the electronic medical record (EMR) including herbal products and OTC medications.   Medications Current Outpatient Medications  Medication Sig Dispense Refill   abemaciclib  (VERZENIO ) 100 MG tablet Take 1 tablet (100 mg total) by  mouth 2 (two) times daily. 56 tablet 1   albuterol (VENTOLIN HFA) 108 (90 Base) MCG/ACT inhaler SMARTSIG:1 Puff(s) Via Inhaler 4 Times Daily PRN     alendronate (FOSAMAX) 70 MG tablet Take 70 mg by mouth once a week.     anastrozole  (ARIMIDEX ) 1 MG tablet TAKE 1 TABLET BY MOUTH EVERY DAY 90 tablet 3   Blood Glucose Monitoring Suppl (ONETOUCH VERIO REFLECT) w/Device KIT See admin instructions.     Cholecalciferol (VITAMIN D3) 250 MCG (10000 UT) capsule Take 10,000 Units by mouth daily.     CVS VITAMIN E 180 MG (400 UNIT) CAPS Take 2 capsules by mouth daily.     fenofibrate (TRICOR) 48 MG tablet Take 40 mg by mouth daily.     Fluticasone-Umeclidin-Vilant (TRELEGY ELLIPTA) 100-62.5-25 MCG/ACT AEPB Inhale 100 mcg into the lungs 1 day or 1 dose.     gabapentin  (NEURONTIN ) 300 MG capsule Take 300 mg by mouth 3 (three) times daily.     Ginger 500 MG CAPS Take by mouth.     hydrOXYzine (VISTARIL) 25 MG capsule Take 25 mg by mouth 2 (two) times daily as needed for anxiety.   0   methocarbamol  (ROBAXIN -750) 750 MG tablet Take 1 tablet (750 mg total) by mouth 3 (three) times daily. 21 tablet 0   mirabegron  ER (MYRBETRIQ ) 25 MG TB24 tablet Take 1 tablet (25 mg total) by mouth daily. 30 tablet 11   Misc Natural Products (OSTEO BI-FLEX JOINT SHIELD PO) Take by mouth daily.     naproxen  (NAPROSYN ) 500 MG tablet Take 1 tablet (500 mg total) by mouth 2 (two) times daily. 20 tablet 0   ofloxacin  (FLOXIN ) 0.3 % OTIC solution Place 5 drops into the right ear 2 (two) times daily. 5 mL 0   omeprazole (PRILOSEC OTC) 20 MG tablet Take 20 mg by mouth daily.     ondansetron  (ZOFRAN ) 8 MG tablet Take 1 tablet (8 mg total) by mouth every 8 (eight) hours as needed for nausea or vomiting. 30 tablet 2   Oxcarbazepine (TRILEPTAL) 300 MG tablet Take 300 mg by mouth 2 (two) times daily.     oxyCODONE -acetaminophen  (PERCOCET/ROXICET) 5-325 MG tablet Take 1 tablet by mouth every 6 (six) hours as needed (For pain.).      OZEMPIC, 1  MG/DOSE, 4 MG/3ML SOPN Inject 1 mg into  the skin once a week.     rosuvastatin (CRESTOR) 20 MG tablet Take 20 mg by mouth at bedtime.     sertraline (ZOLOFT) 100 MG tablet Take 100 mg by mouth 2 (two) times daily. Confirmed sertraline is BID per patient via Dr. Cindy Creed  2   tamsulosin  (FLOMAX ) 0.4 MG CAPS capsule Take 1 capsule (0.4 mg total) by mouth daily. 90 capsule 3   topiramate (TOPAMAX) 100 MG tablet Take 100 mg by mouth daily.     loperamide (IMODIUM) 2 MG capsule Take 2 mg by mouth as needed for diarrhea or loose stools. (Patient not taking: Reported on 02/17/2024)     prochlorperazine  (COMPAZINE ) 10 MG tablet Take 1 tablet (10 mg total) by mouth every 6 (six) hours as needed for nausea or vomiting. (Patient not taking: Reported on 02/17/2024) 30 tablet 2   No current facility-administered medications for this visit.    Drug-Drug Interactions (DDIs) DDIs were evaluated? Yes Significant DDIs? No , takes naproxen  PRN The patient was instructed to speak with their health care provider and/or the oral chemotherapy pharmacist before starting any new drug, including prescription or over the counter, natural / herbal products, or vitamins.  Supportive Care Diarrhea: we reviewed that diarrhea is common with abemaciclib  and confirmed that she does have loperamide (Imodium) at home.  We reviewed how to take this medication PRN. Neutropenia: we discussed the importance of having a thermometer and what the Centers for Disease Control and Prevention (CDC) considers a fever which is 100.71F (38C) or higher.  Gave patient 24/7 triage line to call if any fevers or symptoms. ILD/Pneumonitis: we reviewed potential symptoms including cough, shortness, and fatigue.  VTE: reviewed signs of DVT such as leg swelling, redness, pain, or tenderness and signs of PE such as shortness of breath, rapid or irregular heartbeat, cough, chest pain, or lightheadedness. Reviewed to take the medication every 12 hours  (with food sometimes can be easier on the stomach) and to take it at the same time every day. Hepatotoxicity:WNL Drug interactions with grapefruit products  Dosing Assessment Hepatic adjustments needed? No  Renal adjustments needed? No  Toxicity adjustments needed? No  The current dosing regimen is appropriate to continue at this time.  Follow-Up Plan Continue abemaciclib  100 mg by mouth every 12 hours in the adjuvant setting. Started 12/28/22. Continue anastrozole  1 mg by mouth daily Use loperamide (Imodium) by mouth as needed for loose stool Monitor for toxicities She will continue to be followed by her pain management doctor Dr. Denver Flaming from [facility]. On alendronate and calcium/vitamin D through their office. DEXA on 08/22/23 showed osteopenia Her other comorbidities are managed by Dr. Yvonnie Heritage from [facility]. She sees Dr. Yvonnie Heritage every 3-6 months per her report. She will see Dr. Arno Bibles with labs in 8 weeks (04/14/24) She will follow up with clinical pharmacy as deemed necessary by Dr. Hersey Lorenzo participated in the discussion, expressed understanding, and voiced agreement with the above plan. All questions were answered to her satisfaction. The patient was advised to contact the clinic at [phone] with any questions or concerns prior to her return visit.   I spent 30 minutes assessing and educating the patient.  Jemmie Ledgerwood A. Webb Hake, PharmD, BCOP, CPP  Althea Atkinson, RPH-CPP, 02/17/2024  2:31 PM   **Disclaimer: This note was dictated with voice recognition software. Similar sounding words can inadvertently be transcribed and this note may contain transcription errors which may not have been corrected upon publication of  note.**

## 2024-02-17 ENCOUNTER — Inpatient Hospital Stay: Attending: Hematology and Oncology | Admitting: Pharmacist

## 2024-02-17 ENCOUNTER — Other Ambulatory Visit: Payer: Self-pay | Admitting: Pharmacist

## 2024-02-17 ENCOUNTER — Inpatient Hospital Stay: Attending: Hematology and Oncology

## 2024-02-17 VITALS — BP 146/76 | HR 80 | Temp 97.9°F | Resp 18 | Wt 271.0 lb

## 2024-02-17 DIAGNOSIS — Z79811 Long term (current) use of aromatase inhibitors: Secondary | ICD-10-CM | POA: Diagnosis not present

## 2024-02-17 DIAGNOSIS — Z1721 Progesterone receptor positive status: Secondary | ICD-10-CM | POA: Diagnosis not present

## 2024-02-17 DIAGNOSIS — Z170 Estrogen receptor positive status [ER+]: Secondary | ICD-10-CM | POA: Insufficient documentation

## 2024-02-17 DIAGNOSIS — Z1732 Human epidermal growth factor receptor 2 negative status: Secondary | ICD-10-CM | POA: Diagnosis not present

## 2024-02-17 DIAGNOSIS — C50112 Malignant neoplasm of central portion of left female breast: Secondary | ICD-10-CM | POA: Insufficient documentation

## 2024-02-17 DIAGNOSIS — Z923 Personal history of irradiation: Secondary | ICD-10-CM | POA: Insufficient documentation

## 2024-02-17 LAB — CMP (CANCER CENTER ONLY)
ALT: 12 U/L (ref 0–44)
AST: 12 U/L — ABNORMAL LOW (ref 15–41)
Albumin: 4.2 g/dL (ref 3.5–5.0)
Alkaline Phosphatase: 77 U/L (ref 38–126)
Anion gap: 5 (ref 5–15)
BUN: 22 mg/dL (ref 8–23)
CO2: 30 mmol/L (ref 22–32)
Calcium: 9.6 mg/dL (ref 8.9–10.3)
Chloride: 104 mmol/L (ref 98–111)
Creatinine: 0.91 mg/dL (ref 0.44–1.00)
GFR, Estimated: >60 mL/min (ref >60)
Glucose, Bld: 99 mg/dL (ref 70–99)
Potassium: 4.9 mmol/L (ref 3.5–5.1)
Sodium: 139 mmol/L (ref 135–145)
Total Bilirubin: 0.4 mg/dL (ref 0.0–1.2)
Total Protein: 7.1 g/dL (ref 6.5–8.1)

## 2024-02-17 LAB — CBC WITH DIFFERENTIAL (CANCER CENTER ONLY)
Abs Immature Granulocytes: 0.02 10*3/uL (ref 0.00–0.07)
Basophils Absolute: 0 10*3/uL (ref 0.0–0.1)
Basophils Relative: 1 %
Eosinophils Absolute: 0.2 10*3/uL (ref 0.0–0.5)
Eosinophils Relative: 3 %
HCT: 40.9 % (ref 36.0–46.0)
Hemoglobin: 13 g/dL (ref 12.0–15.0)
Immature Granulocytes: 0 %
Lymphocytes Relative: 21 %
Lymphs Abs: 1.1 10*3/uL (ref 0.7–4.0)
MCH: 28.6 pg (ref 26.0–34.0)
MCHC: 31.8 g/dL (ref 30.0–36.0)
MCV: 89.9 fL (ref 80.0–100.0)
Monocytes Absolute: 0.2 10*3/uL (ref 0.1–1.0)
Monocytes Relative: 5 %
Neutro Abs: 3.4 10*3/uL (ref 1.7–7.7)
Neutrophils Relative %: 70 %
Platelet Count: 153 10*3/uL (ref 150–400)
RBC: 4.55 MIL/uL (ref 3.87–5.11)
RDW: 13.9 % (ref 11.5–15.5)
WBC Count: 5 10*3/uL (ref 4.0–10.5)
nRBC: 0 % (ref 0.0–0.2)

## 2024-02-24 ENCOUNTER — Ambulatory Visit: Payer: 59 | Attending: Radiology

## 2024-02-24 VITALS — Wt 267.4 lb

## 2024-02-24 DIAGNOSIS — Z483 Aftercare following surgery for neoplasm: Secondary | ICD-10-CM | POA: Insufficient documentation

## 2024-02-24 NOTE — Therapy (Addendum)
 OUTPATIENT PHYSICAL THERAPY SOZO SCREENING NOTE   Patient Name: Kelli Estrada MRN: [id number] DOB:03-13-1961, 63 y.o., female Today's Date: 02/24/2024  PCP: Janese Medicine., MD REFERRING PROVIDER: Pearlene Bouchard, PA-C   PT End of Session - 02/24/24 1605     Visit Number 20   # unchanged due to screen only   PT Start Time 1603    PT Stop Time 1608    PT Time Calculation (min) 5 min    Activity Tolerance Patient tolerated treatment well    Behavior During Therapy [facility] for tasks assessed/performed             Past Medical History:  Diagnosis Date   Anxiety    Arthritis    Chronic pain    DDD (degenerative disc disease), thoracolumbar    Depression    Family history of breast cancer 08/23/2022   H/O degenerative disc disease    History of radiation therapy    Left breast- 10/25/22-12/11/22- Dr. Retta Caster   History of radiation therapy    Left breast-10/25/22-12/11/22- Dr. Retta Caster   Pre-diabetes    PTSD (post-traumatic stress disorder)    Sleep apnea    Past Surgical History:  Procedure Laterality Date   ABDOMINAL HYSTERECTOMY     BREAST LUMPECTOMY WITH SENTINEL LYMPH NODE BIOPSY Left 09/14/2022   Procedure: LEFT BREAST CENTRAL LUMPECTOMY WITH SENTINEL LYMPH NODE BX;  Surgeon: Caralyn Chandler, MD;  Location: [facility];  Service: General;  Laterality: Left;   COLONOSCOPY WITH PROPOFOL  N/A 03/15/2016   Procedure: COLONOSCOPY WITH PROPOFOL ;  Surgeon: Delilah Fend, MD;  Location: [facility];  Service: Endoscopy;  Laterality: N/A;   NECK SURGERY     Patient Active Problem List   Diagnosis Date Noted   Genetic testing 08/27/2022   Family history of breast cancer 08/23/2022   Malignant neoplasm of central portion of left breast in female, estrogen receptor positive (HCC) 08/20/2022   Chronic pain of left knee 06/03/2015   Depression, major, recurrent, moderate (HCC) 06/03/2015   Morbid obesity with body mass index of 50 or higher (HCC) 06/03/2015    Vitamin D deficiency 06/03/2015   PTSD (post-traumatic stress disorder) 06/03/2015   Obstructive sleep apnea 06/03/2015    REFERRING DIAG: left breast cancer at risk for lymphedema  THERAPY DIAG: Aftercare following surgery for neoplasm  PERTINENT HISTORY: Patient was diagnosed on 07/18/2022 with left grade 2 invasive ductal carcinoma breast cancer. It measures 1.9 cm and is located in the central quadrant. It is ER/PR positive and HER2 negative with a Ki67 of 10%. 09/14/22 L breast lumpectomy and SLNB 1/3. Completed radiation. Hx of chronic pain, degenerative disc disease, PTSD   PRECAUTIONS: left UE Lymphedema risk, None  SUBJECTIVE: Pt returns for her 3 month L-Dex screen. "I fell 2 weeks ago and just got a call from the orthopedist today about an appt though. It's my Lt knee that's killing me."  PAIN:  Are you having pain? Yes, Lt knee from fall 2 weeks ago. Seeing an orthopedist soon.  10/10 sharp and constant pain.   SOZO SCREENING: Patient was assessed today using the SOZO machine to determine the lymphedema index score. This was compared to her baseline score. It was determined that she is within the recommended range when compared to her baseline and no further action is needed at this time. She will continue SOZO screenings. These are done every 3 months for 2 years post operatively followed by every 6 months for 2  years, and then annually.   L-DEX FLOWSHEETS - 02/24/24 1600       L-DEX LYMPHEDEMA SCREENING   Measurement Type Unilateral    L-DEX MEASUREMENT EXTREMITY Upper Extremity    POSITION  Standing    DOMINANT SIDE Right    At Risk Side Left    BASELINE SCORE (UNILATERAL) 12.5    L-DEX SCORE (UNILATERAL) 6.3    VALUE CHANGE (UNILAT) -6.2               Denyce Flank, PTA 02/24/2024, 4:06 PM

## 2024-02-25 ENCOUNTER — Encounter: Payer: Self-pay | Admitting: Orthopedic Surgery

## 2024-02-25 ENCOUNTER — Other Ambulatory Visit (HOSPITAL_COMMUNITY): Payer: Self-pay

## 2024-02-25 ENCOUNTER — Ambulatory Visit (INDEPENDENT_AMBULATORY_CARE_PROVIDER_SITE_OTHER): Admitting: Orthopedic Surgery

## 2024-02-25 ENCOUNTER — Ambulatory Visit: Admitting: Orthopedic Surgery

## 2024-02-25 VITALS — BP 135/79 | HR 93

## 2024-02-25 DIAGNOSIS — G8929 Other chronic pain: Secondary | ICD-10-CM | POA: Diagnosis not present

## 2024-02-25 DIAGNOSIS — M25562 Pain in left knee: Secondary | ICD-10-CM

## 2024-02-25 DIAGNOSIS — M1712 Unilateral primary osteoarthritis, left knee: Secondary | ICD-10-CM | POA: Diagnosis not present

## 2024-02-25 MED ORDER — METHYLPREDNISOLONE ACETATE 40 MG/ML IJ SUSP
40.0000 mg | Freq: Once | INTRAMUSCULAR | Status: AC
  Administered 2024-02-25: 40 mg via INTRAMUSCULAR

## 2024-02-25 NOTE — Progress Notes (Signed)
 New Patient Visit  Assessment: Kelli Estrada is a 63 y.o. female with the following: 1. Chronic pain of left knee 2. Arthritis of left knee  Plan: Kelli Estrada has pain in her left knee after recent fall.  She has minimal swelling.  She has some superficial abrasions which have healed.  Radiographs are negative for acute fracture, although she does have moderate degenerative changes overall.  This could have exacerbated her pain.  We discussed multiple treatment options including bracing, medications, icing and an injection.  She is elected proceed with an injection.  This was completed in clinic today.  In addition, she can continue with compression on her left knee.  Ice and topical treatments are also appropriate.  She will return to clinic as needed.    Procedure note injection Left knee joint   Verbal consent was obtained to inject the left knee joint  Timeout was completed to confirm the site of injection.  The skin was prepped with alcohol and ethyl chloride was sprayed at the injection site.  A 21-gauge needle was used to inject 40 mg of Depo-Medrol and 1% lidocaine  (4 cc) into the left knee using an anterolateral approach.  There were no complications. A sterile bandage was applied.     Follow-up: Return if symptoms worsen or fail to improve.  Subjective:  Chief Complaint  Patient presents with   Left Knee - Pain    Left knee pain- fell and landed on left knee. X 04/ 28 went to [employer] [employer].     History of Present Illness: Kelli Estrada is a 63 y.o. female who presents for evaluation of left knee pain.  She states that she fell on her porch a couple of weeks ago.  She lost her balance, fell forward.  She had immediate pain.  She was brought to the emergency department via EMS.  Radiographs were negative for acute injury.  She states she has had issues with both knees before.  Currently, her left is worse than her right.  No prior injections.  Since falling, she is used topical  treatments, as well as ice.  She has been using small knee immobilizer, with the bars removed.  She is using a cane to assist with ambulation.  Pain is diffuse.  She noted swelling, but this has improved.   Review of Systems: No fevers or chills No numbness or tingling No chest pain No shortness of breath No bowel or bladder dysfunction No GI distress No headaches   Medical History:  Past Medical History:  Diagnosis Date   Anxiety    Arthritis    Chronic pain    DDD (degenerative disc disease), thoracolumbar    Depression    Family history of breast cancer 08/23/2022   H/O degenerative disc disease    History of radiation therapy    Left breast- 10/25/22-12/11/22- Dr. Retta Caster   History of radiation therapy    Left breast-10/25/22-12/11/22- Dr. Retta Caster   Pre-diabetes    PTSD (post-traumatic stress disorder)    Sleep apnea     Past Surgical History:  Procedure Laterality Date   ABDOMINAL HYSTERECTOMY     BREAST LUMPECTOMY WITH SENTINEL LYMPH NODE BIOPSY Left 09/14/2022   Procedure: LEFT BREAST CENTRAL LUMPECTOMY WITH SENTINEL LYMPH NODE BX;  Surgeon: Caralyn Chandler, MD;  Location: [facility];  Service: General;  Laterality: Left;   COLONOSCOPY WITH PROPOFOL  N/A 03/15/2016   Procedure: COLONOSCOPY WITH PROPOFOL ;  Surgeon: Delilah Fend, MD;  Location:  [facility];  Service: Endoscopy;  Laterality: N/A;   NECK SURGERY      Family History  Problem Relation Age of Onset   Lung cancer Mother        dx > 74   Breast cancer Maternal Grandmother 34   Social History   Tobacco Use   Smoking status: Never   Smokeless tobacco: Never  Vaping Use   Vaping status: Never Used  Substance Use Topics   Alcohol use: Not Currently    Comment: occasionally    Drug use: No    Not on File  Current Meds  Medication Sig   abemaciclib  (VERZENIO ) 100 MG tablet Take 1 tablet (100 mg total) by mouth 2 (two) times daily.   albuterol (VENTOLIN HFA) 108 (90 Base)  MCG/ACT inhaler SMARTSIG:1 Puff(s) Via Inhaler 4 Times Daily PRN   alendronate (FOSAMAX) 70 MG tablet Take 70 mg by mouth once a week.   anastrozole  (ARIMIDEX ) 1 MG tablet TAKE 1 TABLET BY MOUTH EVERY DAY   Blood Glucose Monitoring Suppl (ONETOUCH VERIO REFLECT) w/Device KIT See admin instructions.   Cholecalciferol (VITAMIN D3) 250 MCG (10000 UT) capsule Take 10,000 Units by mouth daily.   CVS VITAMIN E 180 MG (400 UNIT) CAPS Take 2 capsules by mouth daily.   fenofibrate (TRICOR) 48 MG tablet Take 40 mg by mouth daily.   Fluticasone-Umeclidin-Vilant (TRELEGY ELLIPTA) 100-62.5-25 MCG/ACT AEPB Inhale 100 mcg into the lungs 1 day or 1 dose.   gabapentin  (NEURONTIN ) 300 MG capsule Take 300 mg by mouth 3 (three) times daily.   Ginger 500 MG CAPS Take by mouth.   hydrOXYzine (VISTARIL) 25 MG capsule Take 25 mg by mouth 2 (two) times daily as needed for anxiety.    methocarbamol  (ROBAXIN -750) 750 MG tablet Take 1 tablet (750 mg total) by mouth 3 (three) times daily.   mirabegron  ER (MYRBETRIQ ) 25 MG TB24 tablet Take 1 tablet (25 mg total) by mouth daily.   Misc Natural Products (OSTEO BI-FLEX JOINT SHIELD PO) Take by mouth daily.   naproxen  (NAPROSYN ) 500 MG tablet Take 1 tablet (500 mg total) by mouth 2 (two) times daily.   ofloxacin  (FLOXIN ) 0.3 % OTIC solution Place 5 drops into the right ear 2 (two) times daily.   omeprazole (PRILOSEC OTC) 20 MG tablet Take 20 mg by mouth daily.   ondansetron  (ZOFRAN ) 8 MG tablet Take 1 tablet (8 mg total) by mouth every 8 (eight) hours as needed for nausea or vomiting.   Oxcarbazepine (TRILEPTAL) 300 MG tablet Take 300 mg by mouth 2 (two) times daily.   oxyCODONE -acetaminophen  (PERCOCET/ROXICET) 5-325 MG tablet Take 1 tablet by mouth every 6 (six) hours as needed (For pain.).    OZEMPIC, 1 MG/DOSE, 4 MG/3ML SOPN Inject 1 mg into the skin once a week.   rosuvastatin (CRESTOR) 20 MG tablet Take 20 mg by mouth at bedtime.   sertraline (ZOLOFT) 100 MG tablet Take  100 mg by mouth 2 (two) times daily. Confirmed sertraline is BID per patient via Dr. Cindy Creed   tamsulosin  (FLOMAX ) 0.4 MG CAPS capsule Take 1 capsule (0.4 mg total) by mouth daily.   topiramate (TOPAMAX) 100 MG tablet Take 100 mg by mouth daily.    Objective: BP 135/79   Pulse 93   Physical Exam:  General: Alert and oriented. and No acute distress. Gait: Left sided antalgic gait.  Using a cane.  Left knee with mild swelling over the anterior aspect.  There are some healed superficial abrasions.  Mild tenderness in this area.  Range of motion from 0-120 degrees, with crepitus.  No increased laxity varus valgus stress.  Negative Lachman.  Tenderness palpation along the medial and lateral joint line.  IMAGING: I personally reviewed images previously obtained from the ED  X-rays of the left knee were reviewed in clinic today.  Loss of joint space within the medial compartment.  There are associated osteophytes in all 3 compartments.  Moderate degenerative changes overall.   New Medications:  No orders of the defined types were placed in this encounter.     Tonita Frater, MD  02/25/2024 1:42 PM

## 2024-02-25 NOTE — Patient Instructions (Signed)

## 2024-02-25 NOTE — Addendum Note (Signed)
 Addended by: [place] Snow T on: 02/25/2024 01:49 PM   Modules accepted: Orders

## 2024-03-04 ENCOUNTER — Other Ambulatory Visit: Payer: Self-pay

## 2024-03-06 ENCOUNTER — Other Ambulatory Visit (HOSPITAL_COMMUNITY): Payer: Self-pay

## 2024-03-16 ENCOUNTER — Other Ambulatory Visit: Payer: Self-pay

## 2024-03-17 ENCOUNTER — Other Ambulatory Visit: Payer: Self-pay

## 2024-04-13 ENCOUNTER — Telehealth: Payer: Self-pay

## 2024-04-13 NOTE — Telephone Encounter (Signed)
 Left message to confirm appt for 7/1

## 2024-04-14 ENCOUNTER — Encounter: Payer: Self-pay | Admitting: Hematology and Oncology

## 2024-04-14 ENCOUNTER — Inpatient Hospital Stay

## 2024-04-14 ENCOUNTER — Inpatient Hospital Stay: Attending: Hematology and Oncology | Admitting: Hematology and Oncology

## 2024-04-14 VITALS — BP 128/104 | HR 71 | Temp 98.4°F | Resp 17 | Wt 264.4 lb

## 2024-04-14 DIAGNOSIS — Z79811 Long term (current) use of aromatase inhibitors: Secondary | ICD-10-CM | POA: Insufficient documentation

## 2024-04-14 DIAGNOSIS — Z1732 Human epidermal growth factor receptor 2 negative status: Secondary | ICD-10-CM | POA: Insufficient documentation

## 2024-04-14 DIAGNOSIS — C50112 Malignant neoplasm of central portion of left female breast: Secondary | ICD-10-CM | POA: Diagnosis not present

## 2024-04-14 DIAGNOSIS — Z170 Estrogen receptor positive status [ER+]: Secondary | ICD-10-CM

## 2024-04-14 DIAGNOSIS — R197 Diarrhea, unspecified: Secondary | ICD-10-CM | POA: Diagnosis not present

## 2024-04-14 DIAGNOSIS — K5903 Drug induced constipation: Secondary | ICD-10-CM | POA: Insufficient documentation

## 2024-04-14 DIAGNOSIS — Z923 Personal history of irradiation: Secondary | ICD-10-CM | POA: Diagnosis not present

## 2024-04-14 DIAGNOSIS — Z1721 Progesterone receptor positive status: Secondary | ICD-10-CM | POA: Insufficient documentation

## 2024-04-14 LAB — CBC WITH DIFFERENTIAL (CANCER CENTER ONLY)
Abs Immature Granulocytes: 0.02 10*3/uL (ref 0.00–0.07)
Basophils Absolute: 0.1 10*3/uL (ref 0.0–0.1)
Basophils Relative: 2 %
Eosinophils Absolute: 0.1 10*3/uL (ref 0.0–0.5)
Eosinophils Relative: 3 %
HCT: 38.3 % (ref 36.0–46.0)
Hemoglobin: 12.5 g/dL (ref 12.0–15.0)
Immature Granulocytes: 1 %
Lymphocytes Relative: 24 %
Lymphs Abs: 1 10*3/uL (ref 0.7–4.0)
MCH: 29.1 pg (ref 26.0–34.0)
MCHC: 32.6 g/dL (ref 30.0–36.0)
MCV: 89.3 fL (ref 80.0–100.0)
Monocytes Absolute: 0.3 10*3/uL (ref 0.1–1.0)
Monocytes Relative: 7 %
Neutro Abs: 2.6 10*3/uL (ref 1.7–7.7)
Neutrophils Relative %: 63 %
Platelet Count: 146 10*3/uL — ABNORMAL LOW (ref 150–400)
RBC: 4.29 MIL/uL (ref 3.87–5.11)
RDW: 14.9 % (ref 11.5–15.5)
WBC Count: 4.1 10*3/uL (ref 4.0–10.5)
nRBC: 0 % (ref 0.0–0.2)

## 2024-04-14 LAB — CMP (CANCER CENTER ONLY)
ALT: 17 U/L (ref 0–44)
AST: 13 U/L — ABNORMAL LOW (ref 15–41)
Albumin: 4 g/dL (ref 3.5–5.0)
Alkaline Phosphatase: 58 U/L (ref 38–126)
Anion gap: 9 (ref 5–15)
BUN: 15 mg/dL (ref 8–23)
CO2: 26 mmol/L (ref 22–32)
Calcium: 9.3 mg/dL (ref 8.9–10.3)
Chloride: 106 mmol/L (ref 98–111)
Creatinine: 0.96 mg/dL (ref 0.44–1.00)
GFR, Estimated: >60 mL/min (ref >60)
Glucose, Bld: 147 mg/dL — ABNORMAL HIGH (ref 70–99)
Potassium: 3.7 mmol/L (ref 3.5–5.1)
Sodium: 141 mmol/L (ref 135–145)
Total Bilirubin: 0.4 mg/dL (ref 0.0–1.2)
Total Protein: 6.7 g/dL (ref 6.5–8.1)

## 2024-04-14 NOTE — Progress Notes (Signed)
 Patient Care Team: Jerrye Lamar [name], MD as PCP - General (Family Medicine) Loretha Ash, MD as Consulting Physician (Hematology and Oncology) Curvin Deward MOULD, MD as Consulting Physician (General Surgery) Shannon Agent, MD as Consulting Physician (Radiation Oncology) Lucila Norleen LABOR, RPH-CPP as Pharmacist (Hematology and Oncology)  SUMMARY OF ONCOLOGIC HISTORY: Oncology History  Malignant neoplasm of central portion of left breast in female, estrogen receptor positive (HCC)  07/18/2022 Imaging   Mammogram showed indeterminate left breast mass central to the nipple in the retroareolar region, indeterminate lymph node.  Ultrasound done showed 1.9 x 1.4 x 1.5 cm taller than wide irregular mass in the left breast highly suggestive of malignancy.  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  Report date is 08/29/2022.   The CustomNext-Cancer+RNAinsight panel offered by [facility] includes sequencing and rearrangement analysis for the following 47 genes:  APC, ATM, AXIN2, BARD1, BMPR1A, BRCA1, BRCA2, BRIP1, CDH1, CDK4, CDKN2A, CHEK2, DICER1, EPCAM, GREM1, HOXB13, MEN1, MLH1, MSH2, MSH3, MSH6, MUTYH, NBN, NF1, NF2, NTHL1, PALB2, PMS2, POLD1, POLE, PTEN, RAD51C, RAD51D, RECQL, RET, SDHA, SDHAF2, SDHB, SDHC, SDHD, SMAD4, SMARCA4, STK11, TP53, TSC1, TSC2, and VHL.  RNA  data is routinely analyzed for use in variant interpretation for all genes.   09/14/2022 Surgery   Left lumpectomy, IDC, 2.1cm, g2, margins negative, 1/3 LN positive for metastases, T2n1a   10/04/2022 Oncotype testing   Oncotype DX of 15, no role for adj chemo.   10/25/2022 - 12/11/2022 Radiation Therapy   Plan Name: Breast_L_BH Site: Breast, Left Technique: 3D Mode: Photon Dose Per Fraction: 1.8 Gy Prescribed Dose (Delivered / Prescribed): 50.4 Gy / 50.4 Gy Prescribed Fxs (Delivered / Prescribed): 28 / 28   Plan Name: Brst_L_SCV_BH Site: Breast, Left Technique: 3D Mode: Photon Dose Per Fraction: 1.8 Gy Prescribed Dose (Delivered / Prescribed): 50.4 Gy / 50.4 Gy Prescribed Fxs (Delivered / Prescribed): 28 / 28   Plan Name: Brst_L_Bst_BH Site: Breast, Left Technique: 3D Mode: Photon Dose Per Fraction: 2 Gy Prescribed Dose (Delivered / Prescribed): 10 Gy / 10 Gy Prescribed Fxs (Delivered / Prescribed): 5 / 5     12/2022 -  Anti-estrogen oral therapy   Anastrozole  daily, Verzenio  added in 02/2023     Discussed the use of AI scribe software for clinical note transcription with the patient, who gave verbal consent to proceed.  History of Present Illness   History of Present Illness Kelli Estrada is a 63 year old female with breast cancer who presents for follow-up on her current treatment regimen.  She is undergoing treatment for breast cancer with Verzenio  and anastrozole . She experiences excessive sleepiness, lack of appetite, and nausea, which are exacerbated by the heat at her daughter's home due to lack of air conditioning. She describes feeling generally unwell and states, 'I feel like I'm getting sick to my stomach just sitting here.'  She reports ongoing issues  with her right eye following cataract surgery, describing it as 'messing up big time.' She has difficulty finding a doctor who accepts her insurance without significant out-of-pocket costs. Her insurance issues also  affect her son, who has received unexpected bills for mental health services that were supposed to be covered.  She takes vitamin D and calcium supplements. She feels her body is swelling and describes a general sense of malaise, saying, 'my whole body feels like it's just swelling up.'  She experiences gastrointestinal issues, including stomach cramps and alternating constipation and diarrhea, which she attributes to her medication regimen, including oxycodone . She has not had a bowel movement today but anticipates one later due to the cramping.  Environmental stressors at home, including ongoing issues with air conditioning, have affected her ability to concentrate on her schoolwork. She plans to start going to school to complete her work due to the distractions at home.  Rest of the pertinent 10 point ROS reviewed and neg.  ALLERGIES:  has no allergies on file.  MEDICATIONS:  Current Outpatient Medications  Medication Sig Dispense Refill   abemaciclib  (VERZENIO ) 100 MG tablet Take 1 tablet (100 mg total) by mouth 2 (two) times daily. 56 tablet 1   albuterol (VENTOLIN HFA) 108 (90 Base) MCG/ACT inhaler SMARTSIG:1 Puff(s) Via Inhaler 4 Times Daily PRN     alendronate (FOSAMAX) 70 MG tablet Take 70 mg by mouth once a week.     anastrozole  (ARIMIDEX ) 1 MG tablet TAKE 1 TABLET BY MOUTH EVERY DAY 90 tablet 3   Blood Glucose Monitoring Suppl (ONETOUCH VERIO REFLECT) w/Device KIT See admin instructions.     Cholecalciferol (VITAMIN D3) 250 MCG (10000 UT) capsule Take 10,000 Units by mouth daily.     [employer] VITAMIN E 180 MG (400 UNIT) CAPS Take 2 capsules by mouth daily.     fenofibrate (TRICOR) 48 MG tablet Take 40 mg by mouth daily.     Fluticasone-Umeclidin-Vilant (TRELEGY ELLIPTA) 100-62.5-25 MCG/ACT AEPB Inhale 100 mcg into the lungs 1 day or 1 dose.     gabapentin  (NEURONTIN ) 300 MG capsule Take 300 mg by mouth 3 (three) times daily.     Ginger 500 MG CAPS Take by mouth.     hydrOXYzine  (VISTARIL) 25 MG capsule Take 25 mg by mouth 2 (two) times daily as needed for anxiety.   0   loperamide (IMODIUM) 2 MG capsule Take 2 mg by mouth as needed for diarrhea or loose stools. (Patient not taking: Reported on 02/25/2024)     methocarbamol  (ROBAXIN -750) 750 MG tablet Take 1 tablet (750 mg total) by mouth 3 (three) times daily. 21 tablet 0   mirabegron  ER (MYRBETRIQ ) 25 MG TB24 tablet Take 1 tablet (25 mg total) by mouth daily. 30 tablet 11   Misc Natural Products (OSTEO BI-FLEX JOINT SHIELD PO) Take by mouth daily.     naproxen  (NAPROSYN ) 500 MG tablet Take 1 tablet (500 mg total) by mouth 2 (two) times daily. 20 tablet 0   ofloxacin  (FLOXIN ) 0.3 % OTIC solution Place 5 drops into the right ear 2 (two) times daily. 5 mL 0   omeprazole (PRILOSEC OTC) 20 MG tablet Take 20 mg by mouth daily.     ondansetron  (ZOFRAN ) 8 MG tablet Take 1 tablet (8 mg total) by mouth every 8 (eight) hours as needed for nausea or vomiting. 30 tablet 2   Oxcarbazepine (TRILEPTAL) 300 MG tablet Take 300 mg by mouth 2 (two) times daily.     oxyCODONE -acetaminophen  (PERCOCET/ROXICET)  5-325 MG tablet Take 1 tablet by mouth every 6 (six) hours as needed (For pain.).      OZEMPIC, 1 MG/DOSE, 4 MG/3ML SOPN Inject 1 mg into the skin once a week.     prochlorperazine  (COMPAZINE ) 10 MG tablet Take 1 tablet (10 mg total) by mouth every 6 (six) hours as needed for nausea or vomiting. (Patient not taking: Reported on 02/25/2024) 30 tablet 2   rosuvastatin (CRESTOR) 20 MG tablet Take 20 mg by mouth at bedtime.     sertraline (ZOLOFT) 100 MG tablet Take 100 mg by mouth 2 (two) times daily. Confirmed sertraline is BID per patient via Dr. Lamar Saba  2   tamsulosin  (FLOMAX ) 0.4 MG CAPS capsule Take 1 capsule (0.4 mg total) by mouth daily. 90 capsule 3   topiramate (TOPAMAX) 100 MG tablet Take 100 mg by mouth daily.     No current facility-administered medications for this visit.    PHYSICAL EXAMINATION: ECOG PERFORMANCE  STATUS: 0 - Asymptomatic  Vitals:   04/14/24 1427  BP: (!) 128/104  Pulse: 71  Resp: 17  Temp: 98.4 F (36.9 C)  SpO2: 100%    Filed Weights   04/14/24 1427  Weight: 264 lb 6.4 oz (119.9 kg)    Physical Exam Constitutional:      Appearance: Normal appearance.   Cardiovascular:     Rate and Rhythm: Normal rate and regular rhythm.     Pulses: Normal pulses.     Heart sounds: Normal heart sounds.  Pulmonary:     Effort: Pulmonary effort is normal.     Breath sounds: Normal breath sounds.   Musculoskeletal:     Cervical back: Normal range of motion and neck supple. No rigidity.  Lymphadenopathy:     Cervical: No cervical adenopathy.   Skin:    General: Skin is warm and dry.   Neurological:     Mental Status: She is alert.        LABORATORY DATA:  I have reviewed the data as listed    Latest Ref Rng & Units 04/14/2024   12:59 PM 02/17/2024    1:25 PM 02/10/2024   11:38 AM  CMP  Glucose 70 - 99 mg/dL 852  99  882   BUN 8 - 23 mg/dL 15  22  18    Creatinine 0.44 - 1.00 mg/dL 9.03  9.08  9.21   Sodium 135 - 145 mmol/L 141  139  137   Potassium 3.5 - 5.1 mmol/L 3.7  4.9  3.9   Chloride 98 - 111 mmol/L 106  104  103   CO2 22 - 32 mmol/L 26  30  23    Calcium 8.9 - 10.3 mg/dL 9.3  9.6  9.0   Total Protein 6.5 - 8.1 g/dL 6.7  7.1  6.4   Total Bilirubin 0.0 - 1.2 mg/dL 0.4  0.4  0.7   Alkaline Phos 38 - 126 U/L 58  77  62   AST 15 - 41 U/L 13  12  18    ALT 0 - 44 U/L 17  12  20      Lab Results  Component Value Date   WBC 4.1 04/14/2024   HGB 12.5 04/14/2024   HCT 38.3 04/14/2024   MCV 89.3 04/14/2024   PLT 146 (L) 04/14/2024   NEUTROABS 2.6 04/14/2024    ASSESSMENT & PLAN:  Malignant neoplasm of central portion of left breast in female, estrogen receptor positive (HCC) Assessment and Plan Assessment & Plan  Breast cancer Breast cancer under surveillance with Verzenio  and anastrozole .  She is tolerating it very well. -- Continue Verzenio  and  anastrozole . - Plan is to continue verzenio  for 2 yrs and anastrozole  5-10 yrs. - Guardant reveal every 6 months, first one drawn today.  Constipation due to oxycodone  Constipation likely secondary to oxycodone  use, with reports of stomach cramping and irregular bowel movements.  Diarrhea Intermittent diarrhea, possibly related to verzenio  use, contributing to gastrointestinal discomfort.  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 Difficulty finding an affordable ophthalmologist due to insurance issues.  General Health Maintenance She is taking vitamin D and calcium as advised by primary care physician for osteopenia.  Follow-up Next appointment to be scheduled upon checkout.  Fu in 8 weeks with labs with CPP>      Orders Placed This Encounter  Procedures   Guardant Reveal    Standing Status:   Future    Number of Occurrences:   1    Expiration Date:   04/14/2025    Amber Stalls, MD 04/14/24

## 2024-04-14 NOTE — Assessment & Plan Note (Addendum)
 Assessment and Plan Assessment & Plan Breast cancer Breast cancer under surveillance with Verzenio  and anastrozole .  She is tolerating it very well. -- Continue Verzenio  and anastrozole . - Plan is to continue verzenio  for 2 yrs and anastrozole  5-10 yrs. - Guardant reveal every 6 months, first one drawn today.  Constipation due to oxycodone  Constipation likely secondary to oxycodone  use, with reports of stomach cramping and irregular bowel movements.  Diarrhea Intermittent diarrhea, possibly related to verzenio  use, contributing to gastrointestinal discomfort.  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 Difficulty finding an affordable ophthalmologist due to insurance issues.  General Health Maintenance She is taking vitamin D and calcium as advised by primary care physician for osteopenia.  Follow-up Next appointment to be scheduled upon checkout.  Fu in 8 weeks with labs with CPP>

## 2024-04-16 ENCOUNTER — Telehealth: Payer: Self-pay | Admitting: Pharmacy Technician

## 2024-04-16 NOTE — Telephone Encounter (Signed)
 Oral Oncology Patient Advocate Encounter  The specialty pharmacy & I haven't been able to get in touch with this pt, after multiple attempts, to refill her Verzenio . Just wanted to make you guys aware.

## 2024-04-27 ENCOUNTER — Encounter: Payer: Self-pay | Admitting: Hematology and Oncology

## 2024-04-29 LAB — GUARDANT REVEAL

## 2024-04-30 ENCOUNTER — Encounter: Payer: Self-pay | Admitting: *Deleted

## 2024-04-30 ENCOUNTER — Other Ambulatory Visit: Payer: Self-pay

## 2024-05-03 ENCOUNTER — Ambulatory Visit: Payer: Self-pay | Admitting: Hematology and Oncology

## 2024-05-18 ENCOUNTER — Telehealth: Payer: Self-pay | Admitting: *Deleted

## 2024-05-18 NOTE — Telephone Encounter (Signed)
 This RN called pt and was able to speak directly with her- she states she has been taking the Verzenio  with no issues or concerns.  When this RN asked about her current supply and need for refill she stated she was not at home and is not sure - when she returns home she will check and stated understanding to either call or send a my chart to for refill.  No other needs at this time.

## 2024-05-20 ENCOUNTER — Other Ambulatory Visit: Payer: Self-pay

## 2024-06-08 ENCOUNTER — Ambulatory Visit: Attending: Radiology

## 2024-06-08 VITALS — Wt 273.1 lb

## 2024-06-08 DIAGNOSIS — Z483 Aftercare following surgery for neoplasm: Secondary | ICD-10-CM | POA: Insufficient documentation

## 2024-06-08 NOTE — Therapy (Signed)
 OUTPATIENT PHYSICAL THERAPY SOZO SCREENING NOTE   Patient Name: Kelli Estrada MRN: [id number] DOB:04-02-1961, 63 y.o., female Today's Date: 06/08/2024  PCP: Jerrye Lamar [name], MD REFERRING PROVIDER: Wyatt Leeroy CHRISTELLA, PA-C   PT End of Session - 06/08/24 1501     Visit Number 20   # unchanged due to screen only   PT Start Time 1258    PT Stop Time 1303    PT Time Calculation (min) 5 min    Activity Tolerance Patient tolerated treatment well    Behavior During Therapy [facility] for tasks assessed/performed          Past Medical History:  Diagnosis Date   Anxiety    Arthritis    Chronic pain    DDD (degenerative disc disease), thoracolumbar    Depression    Family history of breast cancer 08/23/2022   H/O degenerative disc disease    History of radiation therapy    Left breast- 10/25/22-12/11/22- Dr. Lynwood Nasuti   History of radiation therapy    Left breast-10/25/22-12/11/22- Dr. Lynwood Nasuti   Pre-diabetes    PTSD (post-traumatic stress disorder)    Sleep apnea    Past Surgical History:  Procedure Laterality Date   ABDOMINAL HYSTERECTOMY     BREAST LUMPECTOMY WITH SENTINEL LYMPH NODE BIOPSY Left 09/14/2022   Procedure: LEFT BREAST CENTRAL LUMPECTOMY WITH SENTINEL LYMPH NODE BX;  Surgeon: Curvin Deward MOULD, MD;  Location: [facility];  Service: General;  Laterality: Left;   COLONOSCOPY WITH PROPOFOL  N/A 03/15/2016   Procedure: COLONOSCOPY WITH PROPOFOL ;  Surgeon: Norleen Hint, MD;  Location: [facility];  Service: Endoscopy;  Laterality: N/A;   NECK SURGERY     Patient Active Problem List   Diagnosis Date Noted   Genetic testing 08/27/2022   Family history of breast cancer 08/23/2022   Malignant neoplasm of central portion of left breast in female, estrogen receptor positive (HCC) 08/20/2022   Chronic pain of left knee 06/03/2015   Depression, major, recurrent, moderate (HCC) 06/03/2015   Morbid obesity with body mass index of 50 or higher (HCC) 06/03/2015    Vitamin D deficiency 06/03/2015   PTSD (post-traumatic stress disorder) 06/03/2015   Obstructive sleep apnea 06/03/2015    REFERRING DIAG: left breast cancer at risk for lymphedema  THERAPY DIAG: Aftercare following surgery for neoplasm  PERTINENT HISTORY: Patient was diagnosed on 07/18/2022 with left grade 2 invasive ductal carcinoma breast cancer. It measures 1.9 cm and is located in the central quadrant. It is ER/PR positive and HER2 negative with a Ki67 of 10%. 09/14/22 L breast lumpectomy and SLNB 1/3. Completed radiation. Hx of chronic pain, degenerative disc disease, PTSD   PRECAUTIONS: left UE Lymphedema risk, None  SUBJECTIVE: Pt returns for her 3 month L-Dex screen.   PAIN:  Are you having pain? No, not related to breast cancer surgery  SOZO SCREENING: Patient was assessed today using the SOZO machine to determine the lymphedema index score. This was compared to her baseline score. It was determined that she is within the recommended range when compared to her baseline and no further action is needed at this time. She will continue SOZO screenings. These are done every 3 months for 2 years post operatively followed by every 6 months for 2 years, and then annually.   L-DEX FLOWSHEETS - 06/08/24 1500       L-DEX LYMPHEDEMA SCREENING   Measurement Type Unilateral    L-DEX MEASUREMENT EXTREMITY Upper Extremity    POSITION  Standing  DOMINANT SIDE Right    At Risk Side Left    BASELINE SCORE (UNILATERAL) 12.5    L-DEX SCORE (UNILATERAL) 6.5    VALUE CHANGE (UNILAT) -6            Aden Berwyn Caldron, PTA 06/08/2024, 3:02 PM

## 2024-06-09 ENCOUNTER — Inpatient Hospital Stay: Admitting: Pharmacist

## 2024-06-09 ENCOUNTER — Inpatient Hospital Stay: Attending: Hematology and Oncology

## 2024-06-09 VITALS — BP 139/75 | HR 61 | Temp 98.1°F | Resp 18 | Ht 63.0 in | Wt 272.7 lb

## 2024-06-09 DIAGNOSIS — Z170 Estrogen receptor positive status [ER+]: Secondary | ICD-10-CM | POA: Diagnosis not present

## 2024-06-09 DIAGNOSIS — Z1732 Human epidermal growth factor receptor 2 negative status: Secondary | ICD-10-CM | POA: Diagnosis not present

## 2024-06-09 DIAGNOSIS — Z79811 Long term (current) use of aromatase inhibitors: Secondary | ICD-10-CM | POA: Diagnosis not present

## 2024-06-09 DIAGNOSIS — Z1721 Progesterone receptor positive status: Secondary | ICD-10-CM | POA: Insufficient documentation

## 2024-06-09 DIAGNOSIS — C50112 Malignant neoplasm of central portion of left female breast: Secondary | ICD-10-CM | POA: Insufficient documentation

## 2024-06-09 LAB — CBC WITH DIFFERENTIAL (CANCER CENTER ONLY)
Abs Immature Granulocytes: 0.03 K/uL (ref 0.00–0.07)
Basophils Absolute: 0 K/uL (ref 0.0–0.1)
Basophils Relative: 1 %
Eosinophils Absolute: 0.1 K/uL (ref 0.0–0.5)
Eosinophils Relative: 3 %
HCT: 38.9 % (ref 36.0–46.0)
Hemoglobin: 12.8 g/dL (ref 12.0–15.0)
Immature Granulocytes: 1 %
Lymphocytes Relative: 21 %
Lymphs Abs: 0.8 K/uL (ref 0.7–4.0)
MCH: 29.2 pg (ref 26.0–34.0)
MCHC: 32.9 g/dL (ref 30.0–36.0)
MCV: 88.8 fL (ref 80.0–100.0)
Monocytes Absolute: 0.4 K/uL (ref 0.1–1.0)
Monocytes Relative: 10 %
Neutro Abs: 2.6 K/uL (ref 1.7–7.7)
Neutrophils Relative %: 64 %
Platelet Count: 145 K/uL — ABNORMAL LOW (ref 150–400)
RBC: 4.38 MIL/uL (ref 3.87–5.11)
RDW: 14.2 % (ref 11.5–15.5)
WBC Count: 4 K/uL (ref 4.0–10.5)
nRBC: 0 % (ref 0.0–0.2)

## 2024-06-09 LAB — CMP (CANCER CENTER ONLY)
ALT: 31 U/L (ref 0–44)
AST: 27 U/L (ref 15–41)
Albumin: 3.8 g/dL (ref 3.5–5.0)
Alkaline Phosphatase: 52 U/L (ref 38–126)
Anion gap: 4 — ABNORMAL LOW (ref 5–15)
BUN: 9 mg/dL (ref 8–23)
CO2: 30 mmol/L (ref 22–32)
Calcium: 9 mg/dL (ref 8.9–10.3)
Chloride: 107 mmol/L (ref 98–111)
Creatinine: 0.68 mg/dL (ref 0.44–1.00)
GFR, Estimated: >60 mL/min (ref >60)
Glucose, Bld: 107 mg/dL — ABNORMAL HIGH (ref 70–99)
Potassium: 4.6 mmol/L (ref 3.5–5.1)
Sodium: 141 mmol/L (ref 135–145)
Total Bilirubin: 0.5 mg/dL (ref 0.0–1.2)
Total Protein: 6.5 g/dL (ref 6.5–8.1)

## 2024-06-09 NOTE — Progress Notes (Signed)
 [facility]       Telephone: [phone]?Fax: [phone]   Oncology Clinical Pharmacist Practitioner Progress Note  Kelli Estrada was contacted via in-person to discuss her chemotherapy regimen for abemaciclib  which they receive under the care of Dr. Amber Stalls.   Current treatment regimen and start date Abemaciclib  (12/28/22) Anastrozole  (12/31/22)   Interval History She continues on abemaciclib  100 mg by mouth every 12 hours on days 1 to 28 of a 28-day cycle. This is being given in combination with anastrozole . Therapy is planned to continue until two years in the adjuvant setting per the monarchE trial data. She was seen today as a follow up to her abemaciclib  management. Kelli Estrada was last seen by clinical pharmacy on 02/17/24 and Dr. Stalls on 04/14/24. She states she stopped taking abemaciclib  for several months because she had a lot going on. She has recently restarted and has about a week left. [facility] was informed because last fill was in April 2025.  Response to Therapy Continues to tolerate abemaciclib  well but just recently started back and has about a week left. She did not stop the anastrozole  per her report or her other medications.  Labs, vitals, treatment parameters, and manufacturer guidelines assessing toxicity were reviewed with Levorn Lango today. Based on these values, patient is in agreement to continue abemaciclib  therapy at this time.  Allergies Not on File  Vitals    06/09/2024    2:13 PM 06/08/2024    3:00 PM 04/14/2024    2:27 PM  Oncology Vitals  Height 160 cm    Weight 123.696 kg 123.889 kg 119.931 kg  Weight (lbs) 272 lbs 11 oz 273 lbs 2 oz 264 lbs 6 oz  BMI 48.31 kg/m2 48.38 kg/m2 46.84 kg/m2  Temp 98.1 F (36.7 C)  98.4 F (36.9 C)  Pulse Rate 61  71  BP 139/75  128/104  Resp 18  17  SpO2 99 %  100 %  BSA (m2) 2.34 m2 2.35 m2 2.31 m2    Laboratory Data    Latest Ref Rng & Units 06/09/2024    1:47 PM 04/14/2024   12:59 PM 02/17/2024     1:25 PM  CBC EXTENDED  WBC 4.0 - 10.5 K/uL 4.0  4.1  5.0   RBC 3.87 - 5.11 MIL/uL 4.38  4.29  4.55   Hemoglobin 12.0 - 15.0 g/dL 87.1  87.4  86.9   HCT 36.0 - 46.0 % 38.9  38.3  40.9   Platelets 150 - 400 K/uL 145  146  153   NEUT# 1.7 - 7.7 K/uL 2.6  2.6  3.4   Lymph# 0.7 - 4.0 K/uL 0.8  1.0  1.1        Latest Ref Rng & Units 06/09/2024    1:47 PM 04/14/2024   12:59 PM 02/17/2024    1:25 PM  CMP  Glucose 70 - 99 mg/dL 892  852  99   BUN 8 - 23 mg/dL 9  15  22    Creatinine 0.44 - 1.00 mg/dL 9.31  9.03  9.08   Sodium 135 - 145 mmol/L 141  141  139   Potassium 3.5 - 5.1 mmol/L 4.6  3.7  4.9   Chloride 98 - 111 mmol/L 107  106  104   CO2 22 - 32 mmol/L 30  26  30    Calcium 8.9 - 10.3 mg/dL 9.0  9.3  9.6   Total Protein 6.5 - 8.1 g/dL 6.5  6.7  7.1   Total Bilirubin 0.0 - 1.2 mg/dL 0.5  0.4  0.4   Alkaline Phos 38 - 126 U/L 52  58  77   AST 15 - 41 U/L 27  13  12    ALT 0 - 44 U/L 31  17  12     Adverse Effects Assessment None noted by patient but has been off abemaciclib  for several months  Adherence Assessment [name] reports missing many doses over the past 8 weeks.  Was told by [facility], patient has not filled since 02/04/24 but said has been taking on 05/18/24 call with [facility]Pharmacy. Upon further discussion, Kelli Estrada said she stopped until recently because she had a lot going on. We reviewed importance of taking abemaciclib  to reduce breast cancer recurrence risk Reason for missed dose: decided to stop for several months Patient was re-educated on importance of adherence.   Access Assessment [name] is currently receiving her abemaciclib  through [facility] concerns:  none  Medication Reconciliation The patient's medication list was reviewed today with the patient? Yes New medications or herbal supplements have recently been started? Yes , Breo Ellipta Any medications have been discontinued? Yes , Trelegy The medication list was updated  and reconciled based on the patient's most recent medication list in the electronic medical record (EMR) including herbal products and OTC medications.   Medications Current Outpatient Medications  Medication Sig Dispense Refill   albuterol (VENTOLIN HFA) 108 (90 Base) MCG/ACT inhaler SMARTSIG:1 Puff(s) Via Inhaler 4 Times Daily PRN     alendronate (FOSAMAX) 70 MG tablet Take 70 mg by mouth once a week.     anastrozole  (ARIMIDEX ) 1 MG tablet TAKE 1 TABLET BY MOUTH EVERY DAY 90 tablet 3   Blood Glucose Monitoring Suppl (ONETOUCH VERIO REFLECT) w/Device KIT See admin instructions.     BREO ELLIPTA 100-25 MCG/ACT AEPB Inhale 1 puff into the lungs daily.     Cholecalciferol (VITAMIN D3) 250 MCG (10000 UT) capsule Take 10,000 Units by mouth daily.     CVS VITAMIN E 180 MG (400 UNIT) CAPS Take 2 capsules by mouth daily.     gabapentin  (NEURONTIN ) 300 MG capsule Take 300 mg by mouth 3 (three) times daily.     Ginger 500 MG CAPS Take by mouth.     hydrOXYzine (VISTARIL) 25 MG capsule Take 25 mg by mouth 2 (two) times daily as needed for anxiety.   0   loperamide (IMODIUM) 2 MG capsule Take 2 mg by mouth as needed for diarrhea or loose stools.     methocarbamol  (ROBAXIN -750) 750 MG tablet Take 1 tablet (750 mg total) by mouth 3 (three) times daily. 21 tablet 0   mirabegron  ER (MYRBETRIQ ) 25 MG TB24 tablet Take 1 tablet (25 mg total) by mouth daily. 30 tablet 11   Misc Natural Products (OSTEO BI-FLEX JOINT SHIELD PO) Take by mouth daily.     naproxen  (NAPROSYN ) 500 MG tablet Take 1 tablet (500 mg total) by mouth 2 (two) times daily. (Patient taking differently: Take 500 mg by mouth 2 (two) times daily as needed.) 20 tablet 0   ofloxacin  (FLOXIN ) 0.3 % OTIC solution Place 5 drops into the right ear 2 (two) times daily. 5 mL 0   omeprazole (PRILOSEC OTC) 20 MG tablet Take 20 mg by mouth daily.     ondansetron  (ZOFRAN ) 8 MG tablet Take 1 tablet (8 mg total) by mouth every 8 (eight) hours as needed for  nausea or vomiting. 30  tablet 2   Oxcarbazepine (TRILEPTAL) 300 MG tablet Take 300 mg by mouth 2 (two) times daily.     oxyCODONE -acetaminophen  (PERCOCET/ROXICET) 5-325 MG tablet Take 1 tablet by mouth every 6 (six) hours as needed (For pain.).      OZEMPIC, 1 MG/DOSE, 4 MG/3ML SOPN Inject 1 mg into the skin once a week.     prochlorperazine  (COMPAZINE ) 10 MG tablet Take 1 tablet (10 mg total) by mouth every 6 (six) hours as needed for nausea or vomiting. 30 tablet 2   rosuvastatin (CRESTOR) 20 MG tablet Take 20 mg by mouth at bedtime.     sertraline (ZOLOFT) 100 MG tablet Take 100 mg by mouth 2 (two) times daily. Confirmed sertraline is BID per patient via Dr. Lamar Saba  2   tamsulosin  (FLOMAX ) 0.4 MG CAPS capsule Take 1 capsule (0.4 mg total) by mouth daily. 90 capsule 3   topiramate (TOPAMAX) 100 MG tablet Take 100 mg by mouth daily.     abemaciclib  (VERZENIO ) 100 MG tablet Take 1 tablet (100 mg total) by mouth 2 (two) times daily. (Patient not taking: Reported on 06/09/2024) 56 tablet 1   fenofibrate (TRICOR) 48 MG tablet Take 40 mg by mouth daily.     No current facility-administered medications for this visit.    Drug-Drug Interactions (DDIs) DDIs were evaluated? Yes Significant DDIs? No , takes naproxen  PRN so sertraline DDI is less risk. Have reviewed in past with [facility] The patient was instructed to speak with their health care provider and/or the oral chemotherapy pharmacist before starting any new drug, including prescription or over the counter, natural / herbal products, or vitamins.  Supportive Care Diarrhea: we reviewed that diarrhea is common with abemaciclib  and confirmed that she does have loperamide (Imodium) at home.  We reviewed how to take this medication PRN. Neutropenia: we discussed the importance of having a thermometer and what the Centers for Disease Control and Prevention (CDC) considers a fever which is 100.65F (38C) or higher.  Gave patient 24/7 triage line  to call if any fevers or symptoms. ILD/Pneumonitis: we reviewed potential symptoms including cough, shortness, and fatigue.  VTE: reviewed signs of DVT such as leg swelling, redness, pain, or tenderness and signs of PE such as shortness of breath, rapid or irregular heartbeat, cough, chest pain, or lightheadedness. Reviewed to take the medication every 12 hours (with food sometimes can be easier on the stomach) and to take it at the same time every day. Hepatotoxicity:WNL Drug interactions with grapefruit products  Dosing Assessment Hepatic adjustments needed? No  Renal adjustments needed? No  Toxicity adjustments needed? No  The current dosing regimen is appropriate to continue at this time.  Follow-Up Plan Continue abemaciclib  100 mg by mouth every 12 hours in the adjuvant setting. Continue anastrozole  1 mg by mouth daily Use loperamide (Imodium) by mouth as needed for loose stool Monitor for toxicities She will continue to be followed by her pain management doctor Dr. Leron from [facility]. On alendronate and calcium/vitamin D through their office. DEXA on 08/22/23 showed osteopenia Her other comorbidities are managed by Dr. Saba from [facility]. She sees Dr. Saba every 3-6 months per her report. She will see Dr. Loretha with labs in 8 weeks She will follow up with clinical pharmacy as deemed necessary by Dr. Loretha Levorn Lucienne participated in the discussion, expressed understanding, and voiced agreement with the above plan. All questions were answered to her satisfaction. The patient was advised to contact the clinic  at [phone] with any questions or concerns prior to her return visit.   I spent 30 minutes assessing and educating the patient.  Azizah Lisle A. Lucila, PharmD, BCOP, CPP  Norleen DELENA Lucila, RPH-CPP, 06/09/2024  2:35 PM   **Disclaimer: This note was dictated with voice recognition software. Similar sounding words can inadvertently be transcribed and this note  may contain transcription errors which may not have been corrected upon publication of note.**

## 2024-06-10 ENCOUNTER — Other Ambulatory Visit: Payer: Self-pay

## 2024-06-10 ENCOUNTER — Other Ambulatory Visit (HOSPITAL_COMMUNITY): Payer: Self-pay

## 2024-06-10 NOTE — Progress Notes (Signed)
 Specialty Pharmacy Refill Coordination Note  Kelli Estrada is a 63 y.o. female contacted today regarding refills of specialty medication(s) Abemaciclib  (VERZENIO )   Patient requested Delivery   Delivery date: 06/12/24   Verified address: [address], [place], [place] [address]   Medication will be filled on 06/11/24.    [name], CPhT [place]  [facility] Specialty Pharmacy Services Pharmacy Technician Patient Advocate Specialist II THERESSA [place] Phone: [phone]  Fax: [phone] [email or web address]

## 2024-06-11 ENCOUNTER — Other Ambulatory Visit: Payer: Self-pay

## 2024-06-16 ENCOUNTER — Other Ambulatory Visit: Payer: Self-pay

## 2024-07-06 ENCOUNTER — Other Ambulatory Visit: Payer: Self-pay

## 2024-07-06 ENCOUNTER — Other Ambulatory Visit: Payer: Self-pay | Admitting: Hematology and Oncology

## 2024-07-06 MED ORDER — ABEMACICLIB 100 MG PO TABS
100.0000 mg | ORAL_TABLET | Freq: Two times a day (BID) | ORAL | 1 refills | Status: DC
  Filled 2024-07-06 – 2024-07-08 (×2): qty 56, 28d supply, fill #0
  Filled 2024-08-03 – 2024-08-18 (×2): qty 56, 28d supply, fill #1

## 2024-07-08 ENCOUNTER — Other Ambulatory Visit (HOSPITAL_COMMUNITY): Payer: Self-pay

## 2024-07-08 ENCOUNTER — Other Ambulatory Visit: Payer: Self-pay

## 2024-07-08 ENCOUNTER — Other Ambulatory Visit: Payer: Self-pay | Admitting: Pharmacy Technician

## 2024-07-08 NOTE — Progress Notes (Signed)
 Specialty Pharmacy Refill Coordination Note  Kelli Estrada is a 63 y.o. female contacted today regarding refills of specialty medication(s) Abemaciclib  (VERZENIO )   Patient requested Delivery   Delivery date: 07/10/24   Verified address: [address] [place]   Medication will be filled on 07/09/24.

## 2024-07-08 NOTE — Progress Notes (Signed)
 Specialty Pharmacy Ongoing Clinical Assessment Note  Kelli Estrada is a 63 y.o. female who is being followed by the specialty pharmacy service for RxSp Oncology   Patient's specialty medication(s) reviewed today: Abemaciclib  (VERZENIO )   Missed doses in the last 4 weeks: 2   Patient/Caregiver did not have any additional questions or concerns.   Therapeutic benefit summary: Patient is achieving benefit   Adverse events/side effects summary: No adverse events/side effects   Patient's therapy is appropriate to: Continue    Goals Addressed             This Visit's Progress    Achieve or maintain remission   On track    Patient is on track. Patient will maintain adherence.  Most recent mammogram showed no signs of malignancy (08/02/23).         Follow up: 6 months  [facility]

## 2024-07-10 ENCOUNTER — Ambulatory Visit: Admitting: Urology

## 2024-07-10 ENCOUNTER — Ambulatory Visit (HOSPITAL_COMMUNITY)
Admission: RE | Admit: 2024-07-10 | Discharge: 2024-07-10 | Disposition: A | Discharge status: Home / Self Care | Source: Ambulatory Visit | Attending: Urology | Admitting: Urology

## 2024-07-10 VITALS — BP 106/71 | HR 83

## 2024-07-10 DIAGNOSIS — N3000 Acute cystitis without hematuria: Secondary | ICD-10-CM

## 2024-07-10 DIAGNOSIS — N200 Calculus of kidney: Secondary | ICD-10-CM

## 2024-07-10 LAB — URINALYSIS, ROUTINE W REFLEX MICROSCOPIC
Bilirubin, UA: NEGATIVE
Glucose, UA: NEGATIVE
Nitrite, UA: POSITIVE — AB
Protein,UA: NEGATIVE
Specific Gravity, UA: 1.025 (ref 1.005–1.030)
Urobilinogen, Ur: 1 mg/dL (ref 0.2–1.0)
pH, UA: 6 (ref 5.0–7.5)

## 2024-07-10 LAB — MICROSCOPIC EXAMINATION

## 2024-07-10 NOTE — Progress Notes (Signed)
 07/10/2024 12:39 PM   Kelli Estrada August 28, 1961 [id number]  Referring provider: Jerrye Lamar [name], MD [address] [place],  [place] [address]  Followup nephrolithiasis   HPI: Kelli Estrada is a 63yo here for followup for nephrolithiasis. She drinks 64oz of water and juice. No stone events since last visit. KUB shows stable left lower pole calculus. She denies any flank pain. UA today is concerning for infection. She is not having worsening LUTS.    PMH: Past Medical History:  Diagnosis Date   Anxiety    Arthritis    Chronic pain    DDD (degenerative disc disease), thoracolumbar    Depression    Family history of breast cancer 08/23/2022   H/O degenerative disc disease    History of radiation therapy    Left breast- 10/25/22-12/11/22- Dr. Lynwood Nasuti   History of radiation therapy    Left breast-10/25/22-12/11/22- Dr. Lynwood Nasuti   Pre-diabetes    PTSD (post-traumatic stress disorder)    Sleep apnea     Surgical History: Past Surgical History:  Procedure Laterality Date   ABDOMINAL HYSTERECTOMY     BREAST LUMPECTOMY WITH SENTINEL LYMPH NODE BIOPSY Left 09/14/2022   Procedure: LEFT BREAST CENTRAL LUMPECTOMY WITH SENTINEL LYMPH NODE BX;  Surgeon: Curvin Deward MOULD, MD;  Location: [facility];  Service: General;  Laterality: Left;   COLONOSCOPY WITH PROPOFOL  N/A 03/15/2016   Procedure: COLONOSCOPY WITH PROPOFOL ;  Surgeon: Norleen Hint, MD;  Location: [facility];  Service: Endoscopy;  Laterality: N/A;   NECK SURGERY      Home Medications:  Allergies as of 07/10/2024   Not on File      Medication List        Accurate as of July 10, 2024 12:39 PM. If you have any questions, ask your nurse or doctor.          albuterol 108 (90 Base) MCG/ACT inhaler Commonly known as: VENTOLIN HFA SMARTSIG:1 Puff(s) Via Inhaler 4 Times Daily PRN   alendronate 70 MG tablet Commonly known as: FOSAMAX Take 70 mg by mouth once a week.   anastrozole  1 MG  tablet Commonly known as: ARIMIDEX  TAKE 1 TABLET BY MOUTH EVERY DAY   Breo Ellipta 100-25 MCG/ACT Aepb Generic drug: fluticasone furoate-vilanterol Inhale 1 puff into the lungs daily.   CVS Vitamin E 180 MG (400 UNIT) Caps Generic drug: Vitamin E Take 2 capsules by mouth daily.   fenofibrate 48 MG tablet Commonly known as: TRICOR Take 40 mg by mouth daily.   gabapentin  300 MG capsule Commonly known as: NEURONTIN  Take 300 mg by mouth 3 (three) times daily.   Ginger 500 MG Caps Take by mouth.   hydrOXYzine 25 MG capsule Commonly known as: VISTARIL Take 25 mg by mouth 2 (two) times daily as needed for anxiety.   loperamide 2 MG capsule Commonly known as: IMODIUM Take 2 mg by mouth as needed for diarrhea or loose stools.   methocarbamol  750 MG tablet Commonly known as: Robaxin -750 Take 1 tablet (750 mg total) by mouth 3 (three) times daily.   mirabegron  ER 25 MG Tb24 tablet Commonly known as: MYRBETRIQ  Take 1 tablet (25 mg total) by mouth daily.   naproxen  500 MG tablet Commonly known as: NAPROSYN  Take 1 tablet (500 mg total) by mouth 2 (two) times daily. What changed:  when to take this reasons to take this   ofloxacin  0.3 % OTIC solution Commonly known as: FLOXIN  Place 5 drops into the right ear 2 (  two) times daily.   omeprazole 20 MG tablet Commonly known as: PRILOSEC OTC Take 20 mg by mouth daily.   ondansetron  8 MG tablet Commonly known as: ZOFRAN  Take 1 tablet (8 mg total) by mouth every 8 (eight) hours as needed for nausea or vomiting.   OneTouch Verio Reflect w/Device Kit See admin instructions.   OSTEO BI-FLEX JOINT SHIELD PO Take by mouth daily.   Oxcarbazepine 300 MG tablet Commonly known as: TRILEPTAL Take 300 mg by mouth 2 (two) times daily.   oxyCODONE -acetaminophen  5-325 MG tablet Commonly known as: PERCOCET/ROXICET Take 1 tablet by mouth every 6 (six) hours as needed (For pain.).   Ozempic (1 MG/DOSE) 4 MG/3ML Sopn Generic drug:  Semaglutide (1 MG/DOSE) Inject 1 mg into the skin once a week.   prochlorperazine  10 MG tablet Commonly known as: COMPAZINE  Take 1 tablet (10 mg total) by mouth every 6 (six) hours as needed for nausea or vomiting.   rosuvastatin 20 MG tablet Commonly known as: CRESTOR Take 20 mg by mouth at bedtime.   sertraline 100 MG tablet Commonly known as: ZOLOFT Take 100 mg by mouth 2 (two) times daily. Confirmed sertraline is BID per patient via Dr. Lamar Saba   tamsulosin  0.4 MG Caps capsule Commonly known as: FLOMAX  Take 1 capsule (0.4 mg total) by mouth daily.   topiramate 100 MG tablet Commonly known as: TOPAMAX Take 100 mg by mouth daily.   Verzenio  100 MG tablet Generic drug: abemaciclib  Take 1 tablet (100 mg total) by mouth 2 (two) times daily.   Vitamin D3 250 MCG (10000 UT) capsule Take 10,000 Units by mouth daily.        Allergies: Not on File  Family History: Family History  Problem Relation Age of Onset   Lung cancer Mother        dx > 62   Breast cancer Maternal Grandmother 39    Social History:  reports that she has never smoked. She has never used smokeless tobacco. She reports that she does not currently use alcohol. She reports that she does not use drugs.  ROS: All other review of systems were reviewed and are negative except what is noted above in HPI  Physical Exam: BP 106/71   Pulse 83   Constitutional:  Alert and oriented, No acute distress. HEENT: [place] AT, moist mucus membranes.  Trachea midline, no masses. Cardiovascular: No clubbing, cyanosis, or edema. Respiratory: Normal respiratory effort, no increased work of breathing. GI: Abdomen is soft, nontender, nondistended, no abdominal masses GU: No CVA tenderness.  Lymph: No cervical or inguinal lymphadenopathy. Skin: No rashes, bruises or suspicious lesions. Neurologic: Grossly intact, no focal deficits, moving all 4 extremities. Psychiatric: Normal mood and affect.  Laboratory Data: Lab  Results  Component Value Date   WBC 4.0 06/09/2024   HGB 12.8 06/09/2024   HCT 38.9 06/09/2024   MCV 88.8 06/09/2024   PLT 145 (L) 06/09/2024    Lab Results  Component Value Date   CREATININE 0.68 06/09/2024    No results found for: PSA  No results found for: TESTOSTERONE  No results found for: HGBA1C  Urinalysis    Component Value Date/Time   COLORURINE YELLOW 05/28/2022 0419   APPEARANCEUR Cloudy (A) 01/06/2024 1129   LABSPEC 1.023 05/28/2022 0419   PHURINE 5.0 05/28/2022 0419   GLUCOSEU Negative 01/06/2024 1129   HGBUR MODERATE (A) 05/28/2022 0419   BILIRUBINUR Negative 01/06/2024 1129   KETONESUR NEGATIVE 05/28/2022 0419   PROTEINUR Negative 01/06/2024 1129  PROTEINUR 30 (A) 05/28/2022 0419   NITRITE Positive (A) 01/06/2024 1129   NITRITE NEGATIVE 05/28/2022 0419   LEUKOCYTESUR 1+ (A) 01/06/2024 1129   LEUKOCYTESUR TRACE (A) 05/28/2022 0419    Lab Results  Component Value Date   LABMICR See below: 01/06/2024   WBCUA 11-30 (A) 01/06/2024   LABEPIT >10 (A) 01/06/2024   BACTERIA Many (A) 01/06/2024    Pertinent Imaging:  Results for orders placed during the hospital encounter of 01/06/24  Abdomen 1 view (KUB)  Narrative CLINICAL DATA:  Nephrolithiasis.  EXAM: ABDOMEN - 1 VIEW  COMPARISON:  Abdominal radiograph 07/08/2023  FINDINGS: Punctate stone projecting over the inferior pole of the left kidney measuring 3 mm. No additional calcifications identified. Stool cecum and ascending colon. Pelvic phleboliths. Lumbar spine degenerative changes.  IMPRESSION: Punctate stone projecting over the inferior pole of the left kidney.   Electronically Signed By: Bard Moats M.D. On: 01/17/2024 12:58  No results found for this or any previous visit.  No results found for this or any previous visit.  No results found for this or any previous visit.  Results for orders placed during the hospital encounter of 07/05/22  Ultrasound renal  complete  Narrative CLINICAL DATA:  Follow-up left renal calculi.  EXAM: RENAL / URINARY TRACT ULTRASOUND COMPLETE  COMPARISON:  CT scan of the abdomen and pelvis May 28, 2022.  FINDINGS: Right Kidney:  Renal measurements: 13.7 x 4.9 x 5.4 cm = volume: 186 mL. Echogenicity within normal limits. No mass or hydronephrosis visualized.  Left Kidney:  Renal measurements: 30.3 x 4.9 x 6.2 cm = volume: 212 mL. Contains a 1.4 cm cyst. No follow-up imaging recommended for the cyst. Contains an 8 mm nonobstructive stone.  Bladder:  Appears normal for degree of bladder distention.  Other:  None.  IMPRESSION: 1. There is an 8 mm nonobstructive stone in the left kidney. 2. The kidneys are otherwise unremarkable. 3. The bladder is normal.   Electronically Signed By: Alm Pouch III M.D. On: 07/05/2022 16:50  No results found for this or any previous visit.  No results found for this or any previous visit.  Results for orders placed during the hospital encounter of 05/28/22  CT RENAL STONE STUDY  Narrative CLINICAL DATA:  Left lower quadrant pain.  EXAM: CT ABDOMEN AND PELVIS WITHOUT CONTRAST  TECHNIQUE: Multidetector CT imaging of the abdomen and pelvis was performed following the standard protocol without IV contrast.  RADIATION DOSE REDUCTION: This exam was performed according to the departmental dose-optimization program which includes automated exposure control, adjustment of the mA and/or kV according to patient size and/or use of iterative reconstruction technique.  COMPARISON:  April 29, 2018  FINDINGS: Lower chest: No acute abnormality.  Hepatobiliary: No focal liver abnormality is seen. No gallstones, gallbladder wall thickening, or biliary dilatation.  Pancreas: Unremarkable. No pancreatic ductal dilatation or surrounding inflammatory changes.  Spleen: Normal in size without focal abnormality.  Adrenals/Urinary Tract: Adrenal glands are  unremarkable. Kidneys are normal in size, without focal lesions. A 3 mm nonobstructing renal calculus is seen within the posterior aspect of the mid to lower left kidney. An additional 3 mm obstructing renal calculus is seen at the left UVJ. Mild to moderate severity left-sided hydronephrosis and hydroureter are seen. The urinary bladder is poorly distended and subsequently limited in evaluation.  Stomach/Bowel: Stomach is within normal limits. The appendix is not clearly identified. No evidence of bowel wall thickening, distention, or inflammatory changes. Noninflamed diverticula are seen throughout  the sigmoid colon.  Vascular/Lymphatic: No significant vascular findings are present. No enlarged abdominal or pelvic lymph nodes.  Reproductive: Status post hysterectomy. No adnexal masses.  Other: No abdominal wall hernia or abnormality. No abdominopelvic ascites.  Musculoskeletal: No acute or significant osseous findings.  IMPRESSION: 1. 3 mm obstructing renal calculus at the left UVJ. 2. 3 mm nonobstructing left renal calculus. 3. Sigmoid diverticulosis.   Electronically Signed By: Suzen Dials M.D. On: 05/28/2022 05:03   Assessment & Plan:    1. Kidney stones (Primary) -followup 1 year with a KUB -dietary handout given - Urinalysis, Routine w reflex microscopic  2. Acute cystitis -urine for culture, will call with results  No follow-ups on file.  Belvie Clara, MD  [facility] [place]

## 2024-07-14 ENCOUNTER — Encounter: Payer: Self-pay | Admitting: Urology

## 2024-07-14 LAB — URINE CULTURE

## 2024-07-14 NOTE — Patient Instructions (Signed)

## 2024-07-15 ENCOUNTER — Ambulatory Visit: Payer: Self-pay

## 2024-07-15 MED ORDER — NITROFURANTOIN MONOHYD MACRO 100 MG PO CAPS: 100.0000 mg | ORAL_CAPSULE | Freq: Two times a day (BID) | ORAL | 0 refills | Status: AC

## 2024-07-15 NOTE — Telephone Encounter (Signed)
Patient called and made aware of positive urine culture and Macrobid sent to pharmacy per Dr. Alyson Ingles.

## 2024-07-30 ENCOUNTER — Other Ambulatory Visit: Payer: Self-pay

## 2024-08-03 ENCOUNTER — Telehealth: Payer: Self-pay

## 2024-08-03 ENCOUNTER — Other Ambulatory Visit: Payer: Self-pay

## 2024-08-03 NOTE — Telephone Encounter (Signed)
 Left message on voicemail about upcoming appointment on 10/21

## 2024-08-04 ENCOUNTER — Inpatient Hospital Stay: Attending: Hematology and Oncology

## 2024-08-04 ENCOUNTER — Inpatient Hospital Stay: Admitting: Hematology and Oncology

## 2024-08-04 VITALS — BP 114/89 | HR 92 | Temp 98.8°F | Resp 17 | Wt 278.8 lb

## 2024-08-04 DIAGNOSIS — Z923 Personal history of irradiation: Secondary | ICD-10-CM | POA: Diagnosis not present

## 2024-08-04 DIAGNOSIS — Z1732 Human epidermal growth factor receptor 2 negative status: Secondary | ICD-10-CM | POA: Diagnosis not present

## 2024-08-04 DIAGNOSIS — Z1721 Progesterone receptor positive status: Secondary | ICD-10-CM | POA: Diagnosis not present

## 2024-08-04 DIAGNOSIS — C50112 Malignant neoplasm of central portion of left female breast: Secondary | ICD-10-CM

## 2024-08-04 DIAGNOSIS — Z170 Estrogen receptor positive status [ER+]: Secondary | ICD-10-CM

## 2024-08-04 DIAGNOSIS — M810 Age-related osteoporosis without current pathological fracture: Secondary | ICD-10-CM | POA: Insufficient documentation

## 2024-08-04 DIAGNOSIS — Z79899 Other long term (current) drug therapy: Secondary | ICD-10-CM | POA: Diagnosis not present

## 2024-08-04 DIAGNOSIS — Z79811 Long term (current) use of aromatase inhibitors: Secondary | ICD-10-CM | POA: Insufficient documentation

## 2024-08-04 LAB — CMP (CANCER CENTER ONLY)
ALT: 21 U/L (ref 0–44)
AST: 16 U/L (ref 15–41)
Albumin: 4 g/dL (ref 3.5–5.0)
Alkaline Phosphatase: 69 U/L (ref 38–126)
Anion gap: 6 (ref 5–15)
BUN: 19 mg/dL (ref 8–23)
CO2: 26 mmol/L (ref 22–32)
Calcium: 9.6 mg/dL (ref 8.9–10.3)
Chloride: 106 mmol/L (ref 98–111)
Creatinine: 0.72 mg/dL (ref 0.44–1.00)
GFR, Estimated: >60 mL/min (ref >60)
Glucose, Bld: 104 mg/dL — ABNORMAL HIGH (ref 70–99)
Potassium: 4 mmol/L (ref 3.5–5.1)
Sodium: 138 mmol/L (ref 135–145)
Total Bilirubin: 0.4 mg/dL (ref 0.0–1.2)
Total Protein: 6.8 g/dL (ref 6.5–8.1)

## 2024-08-04 LAB — CBC WITH DIFFERENTIAL (CANCER CENTER ONLY)
Abs Immature Granulocytes: 0.03 K/uL (ref 0.00–0.07)
Basophils Absolute: 0 K/uL (ref 0.0–0.1)
Basophils Relative: 1 %
Eosinophils Absolute: 0.1 K/uL (ref 0.0–0.5)
Eosinophils Relative: 2 %
HCT: 38.8 % (ref 36.0–46.0)
Hemoglobin: 12.9 g/dL (ref 12.0–15.0)
Immature Granulocytes: 1 %
Lymphocytes Relative: 18 %
Lymphs Abs: 1.1 K/uL (ref 0.7–4.0)
MCH: 28.6 pg (ref 26.0–34.0)
MCHC: 33.2 g/dL (ref 30.0–36.0)
MCV: 86 fL (ref 80.0–100.0)
Monocytes Absolute: 0.4 K/uL (ref 0.1–1.0)
Monocytes Relative: 7 %
Neutro Abs: 4.2 K/uL (ref 1.7–7.7)
Neutrophils Relative %: 71 %
Platelet Count: 162 K/uL (ref 150–400)
RBC: 4.51 MIL/uL (ref 3.87–5.11)
RDW: 14.4 % (ref 11.5–15.5)
WBC Count: 5.9 K/uL (ref 4.0–10.5)
nRBC: 0 % (ref 0.0–0.2)

## 2024-08-04 NOTE — Progress Notes (Signed)
 Patient Care Team: Jerrye Lamar [name], MD as PCP - General (Family Medicine) Loretha Ash, MD as Consulting Physician (Hematology and Oncology) Curvin Deward MOULD, MD as Consulting Physician (General Surgery) Shannon Agent, MD as Consulting Physician (Radiation Oncology) Lucila Norleen LABOR, RPH-CPP as Pharmacist (Hematology and Oncology)  SUMMARY OF ONCOLOGIC HISTORY: Oncology History  Malignant neoplasm of central portion of left breast in female, estrogen receptor positive (HCC)  07/18/2022 Imaging   Mammogram showed indeterminate left breast mass central to the nipple in the retroareolar region, indeterminate lymph node.  Ultrasound done showed 1.9 x 1.4 x 1.5 cm taller than wide irregular mass in the left breast highly suggestive of malignancy.  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  Report date is 08/29/2022.   The CustomNext-Cancer+RNAinsight panel offered by [employer] includes sequencing and rearrangement analysis for the following 47 genes:  APC, ATM, AXIN2, BARD1, BMPR1A, BRCA1, BRCA2, BRIP1, CDH1, CDK4, CDKN2A, CHEK2, DICER1, EPCAM, GREM1, HOXB13, MEN1, MLH1, MSH2, MSH3, MSH6, MUTYH, NBN, NF1, NF2, NTHL1, PALB2, PMS2, POLD1, POLE, PTEN, RAD51C, RAD51D, RECQL, RET, SDHA, SDHAF2, SDHB, SDHC, SDHD, SMAD4, SMARCA4, STK11, TP53, TSC1, TSC2, and VHL.  RNA  data is routinely analyzed for use in variant interpretation for all genes.   09/14/2022 Surgery   Left lumpectomy, IDC, 2.1cm, g2, margins negative, 1/3 LN positive for metastases, T2n1a   10/04/2022 Oncotype testing   Oncotype DX of 15, no role for adj chemo.   10/25/2022 - 12/11/2022 Radiation Therapy   Plan Name: Breast_L_BH Site: Breast, Left Technique: 3D Mode: Photon Dose Per Fraction: 1.8 Gy Prescribed Dose (Delivered / Prescribed): 50.4 Gy / 50.4 Gy Prescribed Fxs (Delivered / Prescribed): 28 / 28   Plan Name: Brst_L_SCV_BH Site: Breast, Left Technique: 3D Mode: Photon Dose Per Fraction: 1.8 Gy Prescribed Dose (Delivered / Prescribed): 50.4 Gy / 50.4 Gy Prescribed Fxs (Delivered / Prescribed): 28 / 28   Plan Name: Brst_L_Bst_BH Site: Breast, Left Technique: 3D Mode: Photon Dose Per Fraction: 2 Gy Prescribed Dose (Delivered / Prescribed): 10 Gy / 10 Gy Prescribed Fxs (Delivered / Prescribed): 5 / 5     12/2022 -  Anti-estrogen oral therapy   Anastrozole  daily, Verzenio  added in 02/2023     Discussed the use of AI scribe software for clinical note transcription with the patient, who gave verbal consent to proceed.  History of Present Illness  Kelli Estrada is a 63 year old female with breast cancer and osteoporosis who presents for a follow-up visit.  She is currently on anastrozole  and Verzenio  daily for breast cancer treatment, with no new side effects and confirms adherence to the regimen. A mammogram was scheduled for yesterday but was missed and has been rescheduled for August 14, 2024.  For osteoporosis, she is taking Fosamax once a week without issues or side effects.  No new respiratory symptoms.  She uses a cane for  mobility assistance. Her daughter is reportedly doing well but is very busy with work, limiting her time together. Her household is experiencing illness due to weather changes, but she has not been affected and wishes to avoid getting  sick.  Rest of the pertinent 10 point ROS reviewed and neg.  ALLERGIES:  has no allergies on file.  MEDICATIONS:  Current Outpatient Medications  Medication Sig Dispense Refill   abemaciclib  (VERZENIO ) 100 MG tablet Take 1 tablet (100 mg total) by mouth 2 (two) times daily. 56 tablet 1   albuterol (VENTOLIN HFA) 108 (90 Base) MCG/ACT inhaler SMARTSIG:1 Puff(s) Via Inhaler 4 Times Daily PRN     alendronate (FOSAMAX) 70 MG tablet Take 70 mg by mouth once a week.     anastrozole  (ARIMIDEX ) 1 MG tablet TAKE 1 TABLET BY MOUTH EVERY DAY 90 tablet 3   Blood Glucose Monitoring Suppl (ONETOUCH VERIO REFLECT) w/Device KIT See admin instructions.     BREO ELLIPTA 100-25 MCG/ACT AEPB Inhale 1 puff into the lungs daily.     Cholecalciferol (VITAMIN D3) 250 MCG (10000 UT) capsule Take 10,000 Units by mouth daily.     [employer] VITAMIN E 180 MG (400 UNIT) CAPS Take 2 capsules by mouth daily.     fenofibrate (TRICOR) 48 MG tablet Take 40 mg by mouth daily.     gabapentin  (NEURONTIN ) 300 MG capsule Take 300 mg by mouth 3 (three) times daily.     Ginger 500 MG CAPS Take by mouth.     hydrOXYzine (VISTARIL) 25 MG capsule Take 25 mg by mouth 2 (two) times daily as needed for anxiety.   0   loperamide (IMODIUM) 2 MG capsule Take 2 mg by mouth as needed for diarrhea or loose stools.     methocarbamol  (ROBAXIN -750) 750 MG tablet Take 1 tablet (750 mg total) by mouth 3 (three) times daily. 21 tablet 0   mirabegron  ER (MYRBETRIQ ) 25 MG TB24 tablet Take 1 tablet (25 mg total) by mouth daily. 30 tablet 11   Misc Natural Products (OSTEO BI-FLEX JOINT SHIELD PO) Take by mouth daily.     naproxen  (NAPROSYN ) 500 MG tablet Take 1 tablet (500 mg total) by mouth 2 (two) times daily. (Patient taking differently: Take 500 mg by mouth 2 (two) times daily as needed.) 20 tablet 0   nitrofurantoin , macrocrystal-monohydrate, (MACROBID ) 100 MG capsule Take 1 capsule (100 mg total) by mouth 2 (two) times daily. 14 capsule 0   ofloxacin   (FLOXIN ) 0.3 % OTIC solution Place 5 drops into the right ear 2 (two) times daily. 5 mL 0   omeprazole (PRILOSEC OTC) 20 MG tablet Take 20 mg by mouth daily.     ondansetron  (ZOFRAN ) 8 MG tablet Take 1 tablet (8 mg total) by mouth every 8 (eight) hours as needed for nausea or vomiting. 30 tablet 2   Oxcarbazepine (TRILEPTAL) 300 MG tablet Take 300 mg by mouth 2 (two) times daily.     oxyCODONE -acetaminophen  (PERCOCET/ROXICET) 5-325 MG tablet Take 1 tablet by mouth every 6 (six) hours as needed (For pain.).      OZEMPIC, 1 MG/DOSE, 4 MG/3ML SOPN Inject 1 mg into the skin once a week.     prochlorperazine  (COMPAZINE ) 10 MG tablet Take 1 tablet (10 mg total) by mouth every 6 (six) hours as needed for nausea or vomiting. 30 tablet 2   rosuvastatin (CRESTOR) 20 MG tablet Take 20 mg by mouth at bedtime.     sertraline (ZOLOFT) 100 MG tablet Take 100  mg by mouth 2 (two) times daily. Confirmed sertraline is BID per patient via Dr. Lamar Saba  2   tamsulosin  (FLOMAX ) 0.4 MG CAPS capsule Take 1 capsule (0.4 mg total) by mouth daily. 90 capsule 3   topiramate (TOPAMAX) 100 MG tablet Take 100 mg by mouth daily.     No current facility-administered medications for this visit.    PHYSICAL EXAMINATION: ECOG PERFORMANCE STATUS: 0 - Asymptomatic  Vitals:   08/04/24 1307  BP: 114/89  Pulse: 92  Resp: 17  Temp: 98.8 F (37.1 C)  SpO2: 97%    Filed Weights   08/04/24 1307  Weight: 278 lb 12.8 oz (126.5 kg)    Physical Exam Constitutional:      Appearance: Normal appearance.  Cardiovascular:     Rate and Rhythm: Normal rate and regular rhythm.     Pulses: Normal pulses.     Heart sounds: Normal heart sounds.  Pulmonary:     Effort: Pulmonary effort is normal.     Breath sounds: Normal breath sounds.  Musculoskeletal:     Cervical back: Normal range of motion and neck supple. No rigidity.  Lymphadenopathy:     Cervical: No cervical adenopathy.  Skin:    General: Skin is warm and dry.   Neurological:     Mental Status: She is alert.        LABORATORY DATA:  I have reviewed the data as listed    Latest Ref Rng & Units 08/04/2024   12:44 PM 06/09/2024    1:47 PM 04/14/2024   12:59 PM  CMP  Glucose 70 - 99 mg/dL 895  892  852   BUN 8 - 23 mg/dL 19  9  15    Creatinine 0.44 - 1.00 mg/dL 9.27  9.31  9.03   Sodium 135 - 145 mmol/L 138  141  141   Potassium 3.5 - 5.1 mmol/L 4.0  4.6  3.7   Chloride 98 - 111 mmol/L 106  107  106   CO2 22 - 32 mmol/L 26  30  26    Calcium 8.9 - 10.3 mg/dL 9.6  9.0  9.3   Total Protein 6.5 - 8.1 g/dL 6.8  6.5  6.7   Total Bilirubin 0.0 - 1.2 mg/dL 0.4  0.5  0.4   Alkaline Phos 38 - 126 U/L 69  52  58   AST 15 - 41 U/L 16  27  13    ALT 0 - 44 U/L 21  31  17      Lab Results  Component Value Date   WBC 5.9 08/04/2024   HGB 12.9 08/04/2024   HCT 38.8 08/04/2024   MCV 86.0 08/04/2024   PLT 162 08/04/2024   NEUTROABS 4.2 08/04/2024    ASSESSMENT & PLAN:   Assessment and Plan Assessment & Plan  This is a very pleasant 64 year old female patient with 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   Given early stage, strong ER/PR positivity and no evidence of lymph node involvement we have discussed about upfront surgery followed by Oncotype testing. We have discussed final pathology which showed 1 lymph node involvement malignancy. Oncotype resulted at 15,no benefit from chemotherapy. She then underwent adj radiation and is now on adj abema and anastrozole .  She is tolerating the combination very well. - Blood counts normal. - Continue  anastrozole  and abemaciclib  as prescribed. - complete mammogram as scheduled.  Osteoporosis Managed with alendronate. No new issues. - Continue alendronate once weekly. -No new dental concerns reported.   No orders of the  defined types were placed in this encounter.   Amber Stalls, MD 08/04/24

## 2024-08-05 ENCOUNTER — Other Ambulatory Visit (HOSPITAL_COMMUNITY): Payer: Self-pay

## 2024-08-17 ENCOUNTER — Encounter: Payer: Self-pay | Admitting: Radiology

## 2024-08-18 ENCOUNTER — Encounter: Payer: Self-pay | Admitting: Hematology and Oncology

## 2024-08-18 ENCOUNTER — Other Ambulatory Visit (HOSPITAL_COMMUNITY): Payer: Self-pay

## 2024-08-18 ENCOUNTER — Other Ambulatory Visit: Payer: Self-pay

## 2024-08-18 NOTE — Progress Notes (Signed)
 Specialty Pharmacy Refill Coordination Note  Spoke with Kelli Estrada is a 63 y.o. female contacted today regarding refills of specialty medication(s) Abemaciclib  (VERZENIO )  Doses on hand: Around 2 (4 tabs)  Patient requested: Delivery   Delivery date: 08/19/24   Verified address: [address] [place] [address]  Medication will be filled on 08/18/24 .

## 2024-08-23 ENCOUNTER — Other Ambulatory Visit: Payer: Self-pay | Admitting: Urology

## 2024-08-23 DIAGNOSIS — N201 Calculus of ureter: Secondary | ICD-10-CM

## 2024-08-23 DIAGNOSIS — N3281 Overactive bladder: Secondary | ICD-10-CM

## 2024-09-07 ENCOUNTER — Ambulatory Visit: Attending: Radiology

## 2024-09-07 VITALS — Wt 273.0 lb

## 2024-09-07 DIAGNOSIS — Z483 Aftercare following surgery for neoplasm: Secondary | ICD-10-CM | POA: Insufficient documentation

## 2024-09-07 NOTE — Therapy (Signed)
 OUTPATIENT PHYSICAL THERAPY SOZO SCREENING NOTE   Patient Name: Kelli Estrada MRN: [id number] DOB:Jan 12, 1961, 63 y.o., female Today's Date: 09/07/2024  PCP: Jerrye Lamar [name], MD REFERRING PROVIDER: Wyatt Leeroy CHRISTELLA, PA-C   PT End of Session - 09/07/24 1512     Visit Number 20   # unchanged due to screen only   PT Start Time 1510    PT Stop Time 1514    PT Time Calculation (min) 4 min    Activity Tolerance Patient tolerated treatment well    Behavior During Therapy [facility] for tasks assessed/performed          Past Medical History:  Diagnosis Date   Anxiety    Arthritis    Chronic pain    DDD (degenerative disc disease), thoracolumbar    Depression    Family history of breast cancer 08/23/2022   H/O degenerative disc disease    History of radiation therapy    Left breast- 10/25/22-12/11/22- Dr. Lynwood Nasuti   History of radiation therapy    Left breast-10/25/22-12/11/22- Dr. Lynwood Nasuti   Pre-diabetes    PTSD (post-traumatic stress disorder)    Sleep apnea    Past Surgical History:  Procedure Laterality Date   ABDOMINAL HYSTERECTOMY     BREAST LUMPECTOMY WITH SENTINEL LYMPH NODE BIOPSY Left 09/14/2022   Procedure: LEFT BREAST CENTRAL LUMPECTOMY WITH SENTINEL LYMPH NODE BX;  Surgeon: Curvin Deward MOULD, MD;  Location: [facility];  Service: General;  Laterality: Left;   COLONOSCOPY WITH PROPOFOL  N/A 03/15/2016   Procedure: COLONOSCOPY WITH PROPOFOL ;  Surgeon: Norleen Hint, MD;  Location: [facility];  Service: Endoscopy;  Laterality: N/A;   NECK SURGERY     Patient Active Problem List   Diagnosis Date Noted   Genetic testing 08/27/2022   Family history of breast cancer 08/23/2022   Malignant neoplasm of central portion of left breast in female, estrogen receptor positive (HCC) 08/20/2022   Chronic pain of left knee 06/03/2015   Depression, major, recurrent, moderate (HCC) 06/03/2015   Morbid obesity with body mass index of 50 or higher (HCC) 06/03/2015    Vitamin D deficiency 06/03/2015   PTSD (post-traumatic stress disorder) 06/03/2015   Obstructive sleep apnea 06/03/2015    REFERRING DIAG: left breast cancer at risk for lymphedema  THERAPY DIAG: Aftercare following surgery for neoplasm  PERTINENT HISTORY: Patient was diagnosed on 07/18/2022 with left grade 2 invasive ductal carcinoma breast cancer. It measures 1.9 cm and is located in the central quadrant. It is ER/PR positive and HER2 negative with a Ki67 of 10%. 09/14/22 L breast lumpectomy and SLNB 1/3. Completed radiation. Hx of chronic pain, degenerative disc disease, PTSD   PRECAUTIONS: left UE Lymphedema risk, None  SUBJECTIVE: Pt returns for her last 3 month L-Dex screen.   PAIN:  Are you having pain? No  SOZO SCREENING: Patient was assessed today using the SOZO machine to determine the lymphedema index score. This was compared to her baseline score. It was determined that she is within the recommended range when compared to her baseline and no further action is needed at this time. She will continue SOZO screenings. These are done every 3 months for 2 years post operatively followed by every 6 months for 2 years, and then annually.   L-DEX FLOWSHEETS - 09/07/24 1500       L-DEX LYMPHEDEMA SCREENING   Measurement Type Unilateral    L-DEX MEASUREMENT EXTREMITY Upper Extremity    POSITION  Standing    DOMINANT SIDE  Right    At Risk Side Left    BASELINE SCORE (UNILATERAL) 12.5    L-DEX SCORE (UNILATERAL) 2    VALUE CHANGE (UNILAT) -10.5         P: Begin 6 months next until 09/2026 then can transition to annual.    Aden Berwyn Caldron, PTA 09/07/2024, 3:12 PM

## 2024-09-08 ENCOUNTER — Other Ambulatory Visit: Payer: Self-pay

## 2024-09-08 ENCOUNTER — Other Ambulatory Visit: Payer: Self-pay | Admitting: Hematology and Oncology

## 2024-09-08 MED ORDER — ABEMACICLIB 100 MG PO TABS
100.0000 mg | ORAL_TABLET | Freq: Two times a day (BID) | ORAL | 1 refills | Status: AC
  Filled 2024-09-08 – 2024-09-11 (×2): qty 56, 28d supply, fill #0
  Filled 2024-10-05: qty 56, 28d supply, fill #1

## 2024-09-11 ENCOUNTER — Other Ambulatory Visit (HOSPITAL_COMMUNITY): Payer: Self-pay

## 2024-09-11 ENCOUNTER — Other Ambulatory Visit: Payer: Self-pay

## 2024-09-11 NOTE — Progress Notes (Signed)
 Specialty Pharmacy Refill Coordination Note  Spoke with Kelli Estrada is a 63 y.o. female contacted today regarding refills of specialty medication(s) Abemaciclib  (VERZENIO )  Doses on hand: 6 (12 tabs)  Patient requested: Delivery   Delivery date: 09/15/24   Verified address: [address] [place] [address]  Medication will be filled on 09/14/24

## 2024-09-14 ENCOUNTER — Other Ambulatory Visit: Payer: Self-pay

## 2024-09-21 ENCOUNTER — Telehealth: Payer: Self-pay

## 2024-09-21 NOTE — Telephone Encounter (Signed)
 Oral Oncology Patient Advocate Encounter  Prior Authorization renewal  for Verzenio  has been approved.    PA# [id number] Effective dates: 09/21/2024 through 10/14/2025    Charlott Hamilton,  CPhT-Adv  she/her/hers [place]  [facility] Pharmacy Technician Patient Advocate Specialist III [facility] Phone: [phone]  Fax: [phone] [email or web address]

## 2024-09-21 NOTE — Telephone Encounter (Signed)
 Oral Oncology Patient Advocate Encounter   Received notification that prior authorization for VERZENIO  is due for renewal.   PA submitted on 09/21/2024 Key [id number] Status is pending      Charlott Hamilton,  CPhT-Adv  she/her/hers [facility]  [facility] Specialty Pharmacy Services Pharmacy Technician Patient Advocate Specialist III [facility] Phone: [phone]  Fax: [phone] [email or web address]

## 2024-09-28 ENCOUNTER — Telehealth: Payer: Self-pay

## 2024-09-28 NOTE — Telephone Encounter (Signed)
 Spoke with patient and confirmed appointment on 12/16

## 2024-09-29 ENCOUNTER — Inpatient Hospital Stay: Admitting: Hematology and Oncology

## 2024-09-29 ENCOUNTER — Inpatient Hospital Stay: Attending: Hematology and Oncology

## 2024-09-29 ENCOUNTER — Telehealth: Payer: Self-pay | Admitting: Licensed Clinical Social Worker

## 2024-09-29 VITALS — BP 136/91 | HR 91 | Temp 98.1°F | Resp 16 | Wt 276.6 lb

## 2024-09-29 DIAGNOSIS — Z79811 Long term (current) use of aromatase inhibitors: Secondary | ICD-10-CM | POA: Diagnosis not present

## 2024-09-29 DIAGNOSIS — Z1732 Human epidermal growth factor receptor 2 negative status: Secondary | ICD-10-CM | POA: Insufficient documentation

## 2024-09-29 DIAGNOSIS — C50112 Malignant neoplasm of central portion of left female breast: Secondary | ICD-10-CM

## 2024-09-29 DIAGNOSIS — M810 Age-related osteoporosis without current pathological fracture: Secondary | ICD-10-CM | POA: Insufficient documentation

## 2024-09-29 DIAGNOSIS — Z1721 Progesterone receptor positive status: Secondary | ICD-10-CM | POA: Insufficient documentation

## 2024-09-29 DIAGNOSIS — Z170 Estrogen receptor positive status [ER+]: Secondary | ICD-10-CM | POA: Diagnosis not present

## 2024-09-29 DIAGNOSIS — Z923 Personal history of irradiation: Secondary | ICD-10-CM | POA: Diagnosis not present

## 2024-09-29 LAB — CBC WITH DIFFERENTIAL (CANCER CENTER ONLY)
Abs Immature Granulocytes: 0.02 K/uL (ref 0.00–0.07)
Basophils Absolute: 0 K/uL (ref 0.0–0.1)
Basophils Relative: 1 %
Eosinophils Absolute: 0.1 K/uL (ref 0.0–0.5)
Eosinophils Relative: 2 %
HCT: 38.7 % (ref 36.0–46.0)
Hemoglobin: 12.7 g/dL (ref 12.0–15.0)
Immature Granulocytes: 0 %
Lymphocytes Relative: 16 %
Lymphs Abs: 1 K/uL (ref 0.7–4.0)
MCH: 28.3 pg (ref 26.0–34.0)
MCHC: 32.8 g/dL (ref 30.0–36.0)
MCV: 86.2 fL (ref 80.0–100.0)
Monocytes Absolute: 0.6 K/uL (ref 0.1–1.0)
Monocytes Relative: 10 %
Neutro Abs: 4.7 K/uL (ref 1.7–7.7)
Neutrophils Relative %: 71 %
Platelet Count: 136 K/uL — ABNORMAL LOW (ref 150–400)
RBC: 4.49 MIL/uL (ref 3.87–5.11)
RDW: 14.3 % (ref 11.5–15.5)
WBC Count: 6.5 K/uL (ref 4.0–10.5)
nRBC: 0 % (ref 0.0–0.2)

## 2024-09-29 LAB — CMP (CANCER CENTER ONLY)
ALT: 16 U/L (ref 0–44)
AST: 16 U/L (ref 15–41)
Albumin: 3.9 g/dL (ref 3.5–5.0)
Alkaline Phosphatase: 66 U/L (ref 38–126)
Anion gap: 10 (ref 5–15)
BUN: 11 mg/dL (ref 8–23)
CO2: 26 mmol/L (ref 22–32)
Calcium: 9 mg/dL (ref 8.9–10.3)
Chloride: 101 mmol/L (ref 98–111)
Creatinine: 0.77 mg/dL (ref 0.44–1.00)
GFR, Estimated: >60 mL/min (ref >60)
Glucose, Bld: 103 mg/dL — ABNORMAL HIGH (ref 70–99)
Potassium: 3.8 mmol/L (ref 3.5–5.1)
Sodium: 137 mmol/L (ref 135–145)
Total Bilirubin: 0.7 mg/dL (ref 0.0–1.2)
Total Protein: 6.9 g/dL (ref 6.5–8.1)

## 2024-09-29 NOTE — Progress Notes (Signed)
 Patient Care Team: Jerrye Lamar [name], MD as PCP - General (Family Medicine) Loretha Ash, MD as Consulting Physician (Hematology and Oncology) Curvin Deward MOULD, MD as Consulting Physician (General Surgery) Shannon Agent, MD as Consulting Physician (Radiation Oncology) Lucila Norleen LABOR, RPH-CPP as Pharmacist (Hematology and Oncology)  SUMMARY OF ONCOLOGIC HISTORY: Oncology History  Malignant neoplasm of central portion of left breast in female, estrogen receptor positive (HCC)  07/18/2022 Imaging   Mammogram showed indeterminate left breast mass central to the nipple in the retroareolar region, indeterminate lymph node.  Ultrasound done showed 1.9 x 1.4 x 1.5 cm taller than wide irregular mass in the left breast highly suggestive of malignancy.  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  Report date is 08/29/2022.   The CustomNext-Cancer+RNAinsight panel offered by [facility] includes sequencing and rearrangement analysis for the following 47 genes:  APC, ATM, AXIN2, BARD1, BMPR1A, BRCA1, BRCA2, BRIP1, CDH1, CDK4, CDKN2A, CHEK2, DICER1, EPCAM, GREM1, HOXB13, MEN1, MLH1, MSH2, MSH3, MSH6, MUTYH, NBN, NF1, NF2, NTHL1, PALB2, PMS2, POLD1, POLE, PTEN, RAD51C, RAD51D, RECQL, RET, SDHA, SDHAF2, SDHB, SDHC, SDHD, SMAD4, SMARCA4, STK11, TP53, TSC1, TSC2, and VHL.  RNA  data is routinely analyzed for use in variant interpretation for all genes.   09/14/2022 Surgery   Left lumpectomy, IDC, 2.1cm, g2, margins negative, 1/3 LN positive for metastases, T2n1a   10/04/2022 Oncotype testing   Oncotype DX of 15, no role for adj chemo.   10/25/2022 - 12/11/2022 Radiation Therapy   Plan Name: Breast_L_BH Site: Breast, Left Technique: 3D Mode: Photon Dose Per Fraction: 1.8 Gy Prescribed Dose (Delivered / Prescribed): 50.4 Gy / 50.4 Gy Prescribed Fxs (Delivered / Prescribed): 28 / 28   Plan Name: Brst_L_SCV_BH Site: Breast, Left Technique: 3D Mode: Photon Dose Per Fraction: 1.8 Gy Prescribed Dose (Delivered / Prescribed): 50.4 Gy / 50.4 Gy Prescribed Fxs (Delivered / Prescribed): 28 / 28   Plan Name: Brst_L_Bst_BH Site: Breast, Left Technique: 3D Mode: Photon Dose Per Fraction: 2 Gy Prescribed Dose (Delivered / Prescribed): 10 Gy / 10 Gy Prescribed Fxs (Delivered / Prescribed): 5 / 5     12/2022 -  Anti-estrogen oral therapy   Anastrozole  daily, Verzenio  added in 02/2023     Discussed the use of AI scribe software for clinical note transcription with the patient, who gave verbal consent to proceed.  History of Present Illness  Kelli Estrada is a 63 year old female with hormone receptor-positive breast cancer on adjuvant abemaciclib  and anastrozole  who presents for routine oncology follow-up.  She denies new or enlarging breast masses. Recent mammogram was normal, and she has transitioned to annual screening. She continues abemaciclib , initiated in May 2024 with a planned two-year course, and anastrozole , which she understands will continue for at least five years.  She is also managed for osteoporosis with weekly alendronate. She underwent extraction of four teeth without subsequent dental complications. No  acute issues related to osteoporosis were reported.  She describes persistent pain and locking of her thumb, which interferes with activities of  daily living such as zipping clothing. She also notes increased sweating, particularly under her bra, requiring frequent cleaning, and reports generalized fatigue over the past several weeks.  She expressed concern for her daughter, who recently discovered a breast lump and is unable to obtain medical evaluation due to lack of insurance. She is seeking resources to assist her daughter given her family history of breast cancer.  Rest of the pertinent 10 point ROS reviewed and neg.  ALLERGIES:  has no allergies on file.  MEDICATIONS:  Current Outpatient Medications  Medication Sig Dispense Refill   abemaciclib  (VERZENIO ) 100 MG tablet Take 1 tablet (100 mg total) by mouth 2 (two) times daily. 56 tablet 1   albuterol (VENTOLIN HFA) 108 (90 Base) MCG/ACT inhaler SMARTSIG:1 Puff(s) Via Inhaler 4 Times Daily PRN     alendronate (FOSAMAX) 70 MG tablet Take 70 mg by mouth once a week.     anastrozole  (ARIMIDEX ) 1 MG tablet TAKE 1 TABLET BY MOUTH EVERY DAY 90 tablet 3   Blood Glucose Monitoring Suppl (ONETOUCH VERIO REFLECT) w/Device KIT See admin instructions.     BREO ELLIPTA 100-25 MCG/ACT AEPB Inhale 1 puff into the lungs daily.     Cholecalciferol (VITAMIN D3) 250 MCG (10000 UT) capsule Take 10,000 Units by mouth daily.     [employer]S VITAMIN E 180 MG (400 UNIT) CAPS Take 2 capsules by mouth daily.     fenofibrate (TRICOR) 48 MG tablet Take 40 mg by mouth daily.     gabapentin  (NEURONTIN ) 300 MG capsule Take 300 mg by mouth 3 (three) times daily.     Ginger 500 MG CAPS Take by mouth.     hydrOXYzine (VISTARIL) 25 MG capsule Take 25 mg by mouth 2 (two) times daily as needed for anxiety.   0   loperamide (IMODIUM) 2 MG capsule Take 2 mg by mouth as needed for diarrhea or loose stools.     methocarbamol  (ROBAXIN -750) 750 MG tablet Take 1 tablet (750 mg total) by mouth 3 (three) times daily. 21 tablet 0   mirabegron  ER (MYRBETRIQ ) 25 MG TB24 tablet Take 1 tablet (25 mg total) by mouth daily. 30 tablet 11    Misc Natural Products (OSTEO BI-FLEX JOINT SHIELD PO) Take by mouth daily.     naproxen  (NAPROSYN ) 500 MG tablet Take 1 tablet (500 mg total) by mouth 2 (two) times daily. (Patient taking differently: Take 500 mg by mouth 2 (two) times daily as needed.) 20 tablet 0   nitrofurantoin , macrocrystal-monohydrate, (MACROBID ) 100 MG capsule Take 1 capsule (100 mg total) by mouth 2 (two) times daily. 14 capsule 0   ofloxacin  (FLOXIN ) 0.3 % OTIC solution Place 5 drops into the right ear 2 (two) times daily. 5 mL 0   omeprazole (PRILOSEC OTC) 20 MG tablet Take 20 mg by mouth daily.     ondansetron  (ZOFRAN ) 8 MG tablet Take 1 tablet (8 mg total) by mouth every 8 (eight) hours as needed for nausea or vomiting. 30 tablet 2   Oxcarbazepine (TRILEPTAL) 300 MG tablet Take 300 mg by mouth 2 (two) times daily.     oxyCODONE -acetaminophen  (PERCOCET/ROXICET) 5-325 MG tablet Take 1 tablet by mouth every 6 (six) hours as needed (For pain.).      OZEMPIC, 1 MG/DOSE, 4 MG/3ML SOPN Inject 1 mg into the skin once a week.     prochlorperazine  (  COMPAZINE ) 10 MG tablet Take 1 tablet (10 mg total) by mouth every 6 (six) hours as needed for nausea or vomiting. 30 tablet 2   rosuvastatin (CRESTOR) 20 MG tablet Take 20 mg by mouth at bedtime.     sertraline (ZOLOFT) 100 MG tablet Take 100 mg by mouth 2 (two) times daily. Confirmed sertraline is BID per patient via Dr. Lamar Saba  2   tamsulosin  (FLOMAX ) 0.4 MG CAPS capsule TAKE 1 CAPSULE BY MOUTH EVERY DAY 90 capsule 3   topiramate (TOPAMAX) 100 MG tablet Take 100 mg by mouth daily.     No current facility-administered medications for this visit.    PHYSICAL EXAMINATION: ECOG PERFORMANCE STATUS: 0 - Asymptomatic  Vitals:   09/29/24 1357  BP: (!) 136/91  Pulse: 91  Resp: 16  Temp: 98.1 F (36.7 C)  SpO2: 100%    Filed Weights   09/29/24 1357  Weight: 276 lb 9.6 oz (125.5 kg)    Physical Exam Constitutional:      Appearance: Normal appearance.   Cardiovascular:     Rate and Rhythm: Normal rate and regular rhythm.     Pulses: Normal pulses.     Heart sounds: Normal heart sounds.  Pulmonary:     Effort: Pulmonary effort is normal.     Breath sounds: Normal breath sounds.  Musculoskeletal:     Cervical back: Normal range of motion and neck supple. No rigidity.  Lymphadenopathy:     Cervical: No cervical adenopathy.  Skin:    General: Skin is warm and dry.  Neurological:     Mental Status: She is alert.        LABORATORY DATA:  I have reviewed the data as listed    Latest Ref Rng & Units 09/29/2024    1:04 PM 08/04/2024   12:44 PM 06/09/2024    1:47 PM  CMP  Glucose 70 - 99 mg/dL 896  895  892   BUN 8 - 23 mg/dL 11  19  9    Creatinine 0.44 - 1.00 mg/dL 9.22  9.27  9.31   Sodium 135 - 145 mmol/L 137  138  141   Potassium 3.5 - 5.1 mmol/L 3.8  4.0  4.6   Chloride 98 - 111 mmol/L 101  106  107   CO2 22 - 32 mmol/L 26  26  30    Calcium 8.9 - 10.3 mg/dL 9.0  9.6  9.0   Total Protein 6.5 - 8.1 g/dL 6.9  6.8  6.5   Total Bilirubin 0.0 - 1.2 mg/dL 0.7  0.4  0.5   Alkaline Phos 38 - 126 U/L 66  69  52   AST 15 - 41 U/L 16  16  27    ALT 0 - 44 U/L 16  21  31      Lab Results  Component Value Date   WBC 6.5 09/29/2024   HGB 12.7 09/29/2024   HCT 38.7 09/29/2024   MCV 86.2 09/29/2024   PLT 136 (L) 09/29/2024   NEUTROABS 4.7 09/29/2024    ASSESSMENT & PLAN:   Assessment and Plan Assessment & Plan  This is a very pleasant 63 year old female patient with 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   Given early stage, strong ER/PR positivity and no evidence of lymph node involvement we have discussed about upfront  surgery followed by Oncotype testing. We have discussed final pathology which showed 1 lymph node involvement malignancy. Oncotype  resulted at 15,no benefit from chemotherapy. She then underwent adj radiation and is now on adj abema and anastrozole .  Breast cancer on active therapy On Verzenio  and anastrozole .  - Continue Verzenio  until May 2026. - Continue anastrozole  for a minimum of five years. - Continue annual mammogram surveillance. - Reviewed recent mammogram and blood counts. - Schedule follow-up in eight weeks.  Osteoporosis On weekly Fosamax, recent dental extractions. - Continue Fosamax once weekly.   No orders of the defined types were placed in this encounter.   Amber Stalls, MD 09/29/2024

## 2024-09-29 NOTE — Telephone Encounter (Signed)
 TC to patient after receiving message that she had questions. Pt shared that her daughter discovered a lump and is uninsured and needs resources for how to get an exam. CSW shared information on [facility] program and how to contact them.   Jaynie Hitch E Betzabeth Derringer, LCSW

## 2024-10-05 ENCOUNTER — Other Ambulatory Visit: Payer: Self-pay

## 2024-10-07 ENCOUNTER — Other Ambulatory Visit: Payer: Self-pay

## 2024-10-09 ENCOUNTER — Other Ambulatory Visit: Payer: Self-pay

## 2024-10-15 ENCOUNTER — Other Ambulatory Visit: Payer: Self-pay

## 2024-10-15 ENCOUNTER — Emergency Department (HOSPITAL_COMMUNITY)

## 2024-10-15 ENCOUNTER — Encounter (HOSPITAL_COMMUNITY): Payer: Self-pay

## 2024-10-15 ENCOUNTER — Emergency Department (HOSPITAL_COMMUNITY)
Admission: EM | Admit: 2024-10-15 | Discharge: 2024-10-15 | Disposition: A | Discharge status: Home / Self Care | Attending: Emergency Medicine | Admitting: Emergency Medicine

## 2024-10-15 DIAGNOSIS — W1839XA Other fall on same level, initial encounter: Secondary | ICD-10-CM | POA: Insufficient documentation

## 2024-10-15 DIAGNOSIS — M25572 Pain in left ankle and joints of left foot: Secondary | ICD-10-CM | POA: Diagnosis present

## 2024-10-15 DIAGNOSIS — S8262XA Displaced fracture of lateral malleolus of left fibula, initial encounter for closed fracture: Secondary | ICD-10-CM

## 2024-10-15 DIAGNOSIS — S8265XA Nondisplaced fracture of lateral malleolus of left fibula, initial encounter for closed fracture: Secondary | ICD-10-CM | POA: Insufficient documentation

## 2024-10-15 DIAGNOSIS — Y92002 Bathroom of unspecified non-institutional (private) residence single-family (private) house as the place of occurrence of the external cause: Secondary | ICD-10-CM | POA: Diagnosis not present

## 2024-10-15 MED ORDER — CELECOXIB 200 MG PO CAPS: 200.0000 mg | ORAL_CAPSULE | Freq: Two times a day (BID) | ORAL | 0 refills | Status: AC

## 2024-10-15 MED ORDER — KETOROLAC TROMETHAMINE 60 MG/2ML IM SOLN
30.0000 mg | Freq: Once | INTRAMUSCULAR | Status: AC
  Administered 2024-10-15: 30 mg via INTRAMUSCULAR
  Filled 2024-10-15: qty 2

## 2024-10-15 NOTE — Discharge Instructions (Signed)
 ### Ankle Fracture: Home Care Instructions     ## Your Diagnosis        You have a **stable fracture of your fibula** (the smaller bone on the outside of your lower leg near your ankle). X-rays show that the bone pieces are properly aligned and your ankle joint is stable. This type of fracture can heal well without surgery.      ## Your Treatment Plan        **Walking Boot (CAM Boot):** You should wear your walking boot whenever you are up and moving. You may remove it for bathing and sleeping if comfortable.      **Weight-Bearing:** You may put **as much weight as you can tolerate** on your injured ankle while wearing the boot. Use crutches or a walker for balance and comfort as needed, but you do not need to keep all weight off your ankle.[1][2]      ## What to Expect        **Healing Time:** Most ankle fractures take about **6-12 weeks** to heal enough for normal activities. You will gradually improve during this time.[3][2]      **Pain and Swelling:** Some pain and swelling are normal and may last several weeks. These should gradually improve over time.      ## Home Care Instructions        **Elevation:** Keep your ankle elevated above the level of your heart as much as possible, especially in the first few days. This helps reduce swelling and pain.      **Ice:** Apply ice for 15-20 minutes at a time, several times per day. Always place a towel between the ice and your skin.      **Movement:** Gently move your toes and ankle within the boot to prevent stiffness. You can do gentle ankle circles and toe movements several times daily.[1]      **Boot Care:** Wear the boot whenever walking. You may remove it for bathing and sleeping once comfortable.      **Gradually Increase Activity:** As pain allows, slowly increase how much you walk and how much weight you put on your ankle.      ## Warning Signs - Call Your Doctor If You Notice:        - **Increasing pain** that is not  controlled by your prescribed pain medication      - **Increased swelling, redness, or warmth** around your ankle or calf      - **Numbness or tingling** in your foot or toes that doesn't go away      - **Fever** (temperature over 100.105F or 38C)      - **Drainage or foul odor** from your ankle      - **Skin changes** such as blisters or open sores under the boot      - **Chest pain or shortness of breath** (could indicate a blood clot)      ## Pain Management        Take pain medication as prescribed by your doctor. Over-the-counter medications like acetaminophen  or ibuprofen can help with pain and swelling. Do not take more than directed.      ## Follow-Up Care        **Orthopedic Appointment:** You should see an orthopedic surgeon within **1-2 weeks** of your injury. They will:      - Check your ankle healing with repeat X-rays      - Assess your pain and function      - Adjust your treatment plan  as needed      - Provide guidance on when you can return to normal activities      **Keep Your Appointment:** It is very important to attend your follow-up visit, even if you are feeling better. The doctor needs to confirm your fracture is healing properly.[4]      ## Returning to Activities        Your orthopedic surgeon will tell you when you can:      - Stop wearing the boot      - Return to driving (typically when you can safely control the pedals)      - Return to work (depends on your job requirements)      - Resume sports and exercise      Do not rush your recovery. Returning to activities too quickly can delay healing.      ## Questions?        Write down any questions you have and bring them to your orthopedic appointment. If you have urgent concerns before your appointment, call the orthopedic office or your primary care doctor.      ### References  1. Rehabilitation After Immobilization for Ankle Fracture: The EXACT Randomized Clinical Trial.  Elbridge AM, Beckenkamp PR, Regena CHRISTELLA Ip RD, Melia REMAK. JAMA. 2015;314(13):1376-85. doi:10.1001/jama.[email or web address]. 2. Rehabilitation for Ankle Fractures in Adults. Ezzard HOOPS, Pritchard MW, Kennyth SAUNDERS, et al. The Cochrane Database of Systematic Reviews. 2024;9:CD005595. doi:10.1002/14651858.[id number].ela5. 3. Non-Hip/Non-Vertebral Fractures - How to Treat Best?. Hoffmeyer P, Miozzari H, Holzer N. Best Practice & Research. Clinical Rheumatology. 2019;33(2):236-263. doi:10.1016/j.berh.2019.03.019. 4. Successful Outcomes With Nonoperative Treatment and Immediate Weightbearing Despite Stress-Positive Radiographs in Isolated Distal Fibula (OTA/AO 44B) Fractures. Kyung FORBES Debby SHAUNNA Edwena LITTIE, et al. Journal of Orthopaedic Trauma. 2024;38(1):e20-e27. doi:10.1097/BOT.[id number].

## 2024-10-15 NOTE — ED Triage Notes (Signed)
 Pt arrived via POV c/o left ankle and leg pain that occurred from a fall in the bathroom at her home this morning. Pt reports she was drinking yesterday and woke up this morning to use the restroom. Pt reports she fell when she went to stand up after using the commode. Pt presents with swelling and bruising present in Triage.

## 2024-10-15 NOTE — ED Provider Notes (Signed)
 " [place] EMERGENCY DEPARTMENT AT [facility] Provider Note   CSN: [id number] Arrival date & time: 10/15/24  1403     Patient presents with: Ankle Pain   Kelli Estrada is a 64 y.o. female presents emergency department chief complaint of ankle injury.  Patient reports that she was on the toilet last night around 11 PM when her left leg spasmed, curled up behind her and threw her off the toilet onto all fours.  She states that her left leg was caught.  She was able to stand up and noticed acute pain in her left ankle.  She has been taking her prescribed oxycodone  for pain without relief of symptoms.  She has been able to ambulate.  She did not injure any other part of her body but complains of pain in her ankle radiates up my entire left side.  {Add pertinent medical, surgical, social history, OB history to HPI:32947}  Ankle Pain      Prior to Admission medications  Medication Sig Start Date End Date Taking? Authorizing Provider  abemaciclib  (VERZENIO ) 100 MG tablet Take 1 tablet (100 mg total) by mouth 2 (two) times daily. 09/08/24   Iruku, Praveena, MD  albuterol (VENTOLIN HFA) 108 (90 Base) MCG/ACT inhaler SMARTSIG:1 Puff(s) Via Inhaler 4 Times Daily PRN 04/06/21   [provider]  alendronate (FOSAMAX) 70 MG tablet Take 70 mg by mouth once a week. 06/28/21   [provider]  anastrozole  (ARIMIDEX ) 1 MG tablet TAKE 1 TABLET BY MOUTH EVERY DAY 01/06/24   Iruku, Praveena, MD  Blood Glucose Monitoring Suppl (ONETOUCH VERIO REFLECT) w/Device KIT See admin instructions. 04/03/21   [provider]  BREO ELLIPTA 100-25 MCG/ACT AEPB Inhale 1 puff into the lungs daily. 06/03/24   [provider]  Cholecalciferol (VITAMIN D3) 250 MCG (10000 UT) capsule Take 10,000 Units by mouth daily.    [provider]  CVS VITAMIN E 180 MG (400 UNIT) CAPS Take 2 capsules by mouth daily. 04/08/21   [provider]  fenofibrate (TRICOR) 48 MG tablet Take  40 mg by mouth daily.    [provider]  gabapentin  (NEURONTIN ) 300 MG capsule Take 300 mg by mouth 3 (three) times daily. 02/24/16   [provider]  Ginger 500 MG CAPS Take by mouth.    [provider]  hydrOXYzine (VISTARIL) 25 MG capsule Take 25 mg by mouth 2 (two) times daily as needed for anxiety.  04/16/18   [provider]  loperamide (IMODIUM) 2 MG capsule Take 2 mg by mouth as needed for diarrhea or loose stools.    [provider]  methocarbamol  (ROBAXIN -750) 750 MG tablet Take 1 tablet (750 mg total) by mouth 3 (three) times daily. 02/10/24   Daralene Lonni BIRCH, PA-C  mirabegron  ER (MYRBETRIQ ) 25 MG TB24 tablet Take 1 tablet (25 mg total) by mouth daily. 08/07/23   McKenzie, Belvie CROME, MD  Misc Natural Products (OSTEO BI-FLEX JOINT SHIELD PO) Take by mouth daily.    [provider]  naproxen  (NAPROSYN ) 500 MG tablet Take 1 tablet (500 mg total) by mouth 2 (two) times daily. Patient taking differently: Take 500 mg by mouth 2 (two) times daily as needed. 02/10/24   Daralene Lonni BIRCH, PA-C  nitrofurantoin , macrocrystal-monohydrate, (MACROBID ) 100 MG capsule Take 1 capsule (100 mg total) by mouth 2 (two) times daily. 07/15/24   McKenzie, Belvie CROME, MD  ofloxacin  (FLOXIN ) 0.3 % OTIC solution Place 5 drops into the right ear 2 (  two) times daily. 12/20/23   Cleotilde Rogue, MD  omeprazole (PRILOSEC OTC) 20 MG tablet Take 20 mg by mouth daily.    [provider]  ondansetron  (ZOFRAN ) 8 MG tablet Take 1 tablet (8 mg total) by mouth every 8 (eight) hours as needed for nausea or vomiting. 02/12/23   Iruku, Praveena, MD  Oxcarbazepine (TRILEPTAL) 300 MG tablet Take 300 mg by mouth 2 (two) times daily. 04/03/15   [provider]  oxyCODONE -acetaminophen  (PERCOCET/ROXICET) 5-325 MG tablet Take 1 tablet by mouth every 6 (six) hours as needed (For pain.).  06/03/15   [provider]  OZEMPIC, 1 MG/DOSE, 4 MG/3ML SOPN Inject 1 mg  into the skin once a week. 03/10/21   [provider]  prochlorperazine  (COMPAZINE ) 10 MG tablet Take 1 tablet (10 mg total) by mouth every 6 (six) hours as needed for nausea or vomiting. 02/12/23   Iruku, Praveena, MD  rosuvastatin (CRESTOR) 20 MG tablet Take 20 mg by mouth at bedtime. 07/18/21   [provider]  sertraline (ZOLOFT) 100 MG tablet Take 100 mg by mouth 2 (two) times daily. Confirmed sertraline is BID per patient via Dr. Lamar Saba 02/21/16   [provider]  tamsulosin  (FLOMAX ) 0.4 MG CAPS capsule TAKE 1 CAPSULE BY MOUTH EVERY DAY 08/25/24   McKenzie, Belvie CROME, MD  topiramate (TOPAMAX) 100 MG tablet Take 100 mg by mouth daily. 07/16/21   [provider]    Allergies: Patient has no allergy information on record.    Review of Systems  Updated Vital Signs BP (!) 111/90 (BP Location: Right Arm)   Pulse 91   Temp 98.2 F (36.8 C) (Temporal)   Resp 18   Ht 5' 3 (1.6 m)   Wt 125.5 kg   SpO2 98%   BMI 49.00 kg/m   Physical Exam Vitals and nursing note reviewed.  Constitutional:      General: She is not in acute distress.    Appearance: She is well-developed. She is obese. She is not diaphoretic.  HENT:     Head: Normocephalic and atraumatic.     Right Ear: External ear normal.     Left Ear: External ear normal.     Nose: Nose normal.     Mouth/Throat:     Mouth: Mucous membranes are moist.  Eyes:     General: No scleral icterus.    Conjunctiva/sclera: Conjunctivae normal.  Cardiovascular:     Rate and Rhythm: Normal rate and regular rhythm.     Heart sounds: Normal heart sounds. No murmur heard.    No friction rub. No gallop.  Pulmonary:     Effort: Pulmonary effort is normal. No respiratory distress.     Breath sounds: Normal breath sounds.  Abdominal:     General: Bowel sounds are normal. There is no distension.     Palpations: Abdomen is soft. There is no mass.     Tenderness: There is no abdominal tenderness. There is no  guarding.  Musculoskeletal:     Cervical back: Normal range of motion.     Comments: Swelling and brruising to the Left lateral malleolus. Normal ROM. NVI   Skin:    General: Skin is warm and dry.  Neurological:     Mental Status: She is alert and oriented to person, place, and time.  Psychiatric:        Behavior: Behavior normal.     (all labs ordered are listed, but only abnormal results are displayed) Labs Reviewed -  No data to display  EKG: None  Radiology: No results found.  {Document cardiac monitor, telemetry assessment procedure when appropriate:32947} Procedures   Medications Ordered in the ED - No data to display    {Click here for ABCD2, HEART and other calculators REFRESH Note before signing:1}                              Medical Decision Making Amount and/or Complexity of Data Reviewed Radiology: ordered.   ***  {Document critical care time when appropriate  Document review of labs and clinical decision tools ie CHADS2VASC2, etc  Document your independent review of radiology images and any outside records  Document your discussion with family members, caretakers and with consultants  Document social determinants of health affecting pt's care  Document your decision making why or why not admission, treatments were needed:32947:::1}   Final diagnoses:  None    ED Discharge Orders     None        "

## 2024-11-17 ENCOUNTER — Telehealth: Payer: Self-pay | Admitting: Hematology and Oncology

## 2024-11-17 NOTE — Telephone Encounter (Signed)
 I left message for patient regarding lab and MD appointments on 11/24/2024. Patient's appointment time was changed from 12:45 pm to 8:30 am. I requested for patient to return my call if time does not work for her.

## 2024-11-24 ENCOUNTER — Inpatient Hospital Stay

## 2024-11-24 ENCOUNTER — Inpatient Hospital Stay: Attending: Hematology and Oncology

## 2024-11-24 ENCOUNTER — Inpatient Hospital Stay: Admitting: Hematology and Oncology

## 2025-02-22 ENCOUNTER — Ambulatory Visit: Attending: Radiology

## 2025-07-16 ENCOUNTER — Ambulatory Visit: Admitting: Urology
